# Patient Record
Sex: Male | Born: 1945 | Race: White | Hispanic: No | Marital: Married | Smoking: Never smoker
Health system: Southern US, Community
[De-identification: ages and names within clinical notes are randomized; demographics above are authoritative.]

## PROBLEM LIST (undated history)

## (undated) DIAGNOSIS — G4700 Insomnia, unspecified: Secondary | ICD-10-CM

## (undated) DIAGNOSIS — L719 Rosacea, unspecified: Secondary | ICD-10-CM

## (undated) DIAGNOSIS — Z85030 Personal history of malignant carcinoid tumor of large intestine: Secondary | ICD-10-CM

## (undated) DIAGNOSIS — Z85060 Personal history of malignant carcinoid tumor of small intestine: Secondary | ICD-10-CM

## (undated) DIAGNOSIS — M1710 Unilateral primary osteoarthritis, unspecified knee: Secondary | ICD-10-CM

## (undated) DIAGNOSIS — J383 Other diseases of vocal cords: Secondary | ICD-10-CM

## (undated) DIAGNOSIS — M179 Osteoarthritis of knee, unspecified: Secondary | ICD-10-CM

## (undated) DIAGNOSIS — J309 Allergic rhinitis, unspecified: Secondary | ICD-10-CM

## (undated) DIAGNOSIS — N529 Male erectile dysfunction, unspecified: Secondary | ICD-10-CM

## (undated) HISTORY — PX: APPENDECTOMY: SHX54

## (undated) HISTORY — DX: Unilateral primary osteoarthritis, unspecified knee: M17.10

## (undated) HISTORY — DX: Osteoarthritis of knee, unspecified: M17.9

## (undated) HISTORY — DX: Other diseases of vocal cords: J38.3

## (undated) HISTORY — DX: Allergic rhinitis, unspecified: J30.9

## (undated) HISTORY — DX: Insomnia, unspecified: G47.00

## (undated) HISTORY — PX: COLONOSCOPY: SHX174

## (undated) HISTORY — PX: KNEE SURGERY: SHX244

## (undated) HISTORY — PX: PARTIAL COLECTOMY: SHX5273

## (undated) HISTORY — DX: Rosacea, unspecified: L71.9

## (undated) HISTORY — DX: Male erectile dysfunction, unspecified: N52.9

## (undated) HISTORY — DX: Personal history of malignant carcinoid tumor of large intestine: Z85.030

## (undated) HISTORY — PX: INGUINAL HERNIA REPAIR: SUR1180

## (undated) HISTORY — DX: Personal history of malignant carcinoid tumor of small intestine: Z85.060

---

## 2002-11-10 ENCOUNTER — Encounter: Payer: Self-pay | Admitting: Orthopedic Surgery

## 2002-11-10 ENCOUNTER — Encounter: Payer: Self-pay | Admitting: Emergency Medicine

## 2002-11-10 ENCOUNTER — Observation Stay (HOSPITAL_COMMUNITY): Admission: EM | Admit: 2002-11-10 | Discharge: 2002-11-11 | Payer: Self-pay | Admitting: Emergency Medicine

## 2002-11-11 ENCOUNTER — Encounter: Payer: Self-pay | Admitting: Orthopedic Surgery

## 2003-04-21 ENCOUNTER — Ambulatory Visit (HOSPITAL_COMMUNITY): Admission: RE | Admit: 2003-04-21 | Discharge: 2003-04-21 | Payer: Self-pay | Admitting: Gastroenterology

## 2003-05-13 ENCOUNTER — Encounter: Payer: Self-pay | Admitting: Gastroenterology

## 2003-05-13 ENCOUNTER — Encounter: Admission: RE | Admit: 2003-05-13 | Discharge: 2003-05-13 | Payer: Self-pay | Admitting: Gastroenterology

## 2003-06-13 ENCOUNTER — Encounter: Payer: Self-pay | Admitting: General Surgery

## 2003-06-18 ENCOUNTER — Inpatient Hospital Stay (HOSPITAL_COMMUNITY): Admission: RE | Admit: 2003-06-18 | Discharge: 2003-06-23 | Payer: Self-pay | Admitting: General Surgery

## 2013-06-18 ENCOUNTER — Other Ambulatory Visit (HOSPITAL_COMMUNITY): Payer: Self-pay | Admitting: Family Medicine

## 2013-06-18 DIAGNOSIS — J441 Chronic obstructive pulmonary disease with (acute) exacerbation: Secondary | ICD-10-CM

## 2013-06-26 ENCOUNTER — Encounter (HOSPITAL_COMMUNITY): Payer: Self-pay

## 2014-02-20 ENCOUNTER — Ambulatory Visit
Admission: RE | Admit: 2014-02-20 | Discharge: 2014-02-20 | Disposition: A | Discharge status: Home / Self Care | Payer: Commercial Managed Care - HMO | Source: Ambulatory Visit | Attending: Family Medicine | Admitting: Family Medicine

## 2014-02-20 ENCOUNTER — Other Ambulatory Visit: Payer: Self-pay | Admitting: Family Medicine

## 2014-02-20 DIAGNOSIS — M79645 Pain in left finger(s): Secondary | ICD-10-CM

## 2016-06-08 DIAGNOSIS — N529 Male erectile dysfunction, unspecified: Secondary | ICD-10-CM | POA: Diagnosis not present

## 2016-06-08 DIAGNOSIS — K219 Gastro-esophageal reflux disease without esophagitis: Secondary | ICD-10-CM | POA: Diagnosis not present

## 2016-06-08 DIAGNOSIS — Z85060 Personal history of malignant carcinoid tumor of small intestine: Secondary | ICD-10-CM | POA: Diagnosis not present

## 2016-06-08 DIAGNOSIS — Z23 Encounter for immunization: Secondary | ICD-10-CM | POA: Diagnosis not present

## 2016-06-08 DIAGNOSIS — G4700 Insomnia, unspecified: Secondary | ICD-10-CM | POA: Diagnosis not present

## 2016-06-08 DIAGNOSIS — Z0000 Encounter for general adult medical examination without abnormal findings: Secondary | ICD-10-CM | POA: Diagnosis not present

## 2016-06-08 DIAGNOSIS — Z79899 Other long term (current) drug therapy: Secondary | ICD-10-CM | POA: Diagnosis not present

## 2016-06-08 DIAGNOSIS — B001 Herpesviral vesicular dermatitis: Secondary | ICD-10-CM | POA: Diagnosis not present

## 2016-06-08 DIAGNOSIS — L719 Rosacea, unspecified: Secondary | ICD-10-CM | POA: Diagnosis not present

## 2016-06-08 DIAGNOSIS — Z125 Encounter for screening for malignant neoplasm of prostate: Secondary | ICD-10-CM | POA: Diagnosis not present

## 2016-08-22 DIAGNOSIS — H2513 Age-related nuclear cataract, bilateral: Secondary | ICD-10-CM | POA: Diagnosis not present

## 2016-08-22 DIAGNOSIS — H04123 Dry eye syndrome of bilateral lacrimal glands: Secondary | ICD-10-CM | POA: Diagnosis not present

## 2016-08-22 DIAGNOSIS — H02831 Dermatochalasis of right upper eyelid: Secondary | ICD-10-CM | POA: Diagnosis not present

## 2016-10-31 DIAGNOSIS — C44311 Basal cell carcinoma of skin of nose: Secondary | ICD-10-CM | POA: Diagnosis not present

## 2016-10-31 DIAGNOSIS — C44319 Basal cell carcinoma of skin of other parts of face: Secondary | ICD-10-CM | POA: Diagnosis not present

## 2016-11-21 DIAGNOSIS — C44311 Basal cell carcinoma of skin of nose: Secondary | ICD-10-CM | POA: Diagnosis not present

## 2016-11-21 DIAGNOSIS — Z85828 Personal history of other malignant neoplasm of skin: Secondary | ICD-10-CM | POA: Diagnosis not present

## 2017-03-21 DIAGNOSIS — L309 Dermatitis, unspecified: Secondary | ICD-10-CM | POA: Diagnosis not present

## 2017-06-16 DIAGNOSIS — D2261 Melanocytic nevi of right upper limb, including shoulder: Secondary | ICD-10-CM | POA: Diagnosis not present

## 2017-06-16 DIAGNOSIS — Z85828 Personal history of other malignant neoplasm of skin: Secondary | ICD-10-CM | POA: Diagnosis not present

## 2017-06-16 DIAGNOSIS — D2239 Melanocytic nevi of other parts of face: Secondary | ICD-10-CM | POA: Diagnosis not present

## 2017-06-16 DIAGNOSIS — D2272 Melanocytic nevi of left lower limb, including hip: Secondary | ICD-10-CM | POA: Diagnosis not present

## 2017-06-16 DIAGNOSIS — L821 Other seborrheic keratosis: Secondary | ICD-10-CM | POA: Diagnosis not present

## 2017-06-16 DIAGNOSIS — D225 Melanocytic nevi of trunk: Secondary | ICD-10-CM | POA: Diagnosis not present

## 2017-06-16 DIAGNOSIS — L570 Actinic keratosis: Secondary | ICD-10-CM | POA: Diagnosis not present

## 2017-06-26 DIAGNOSIS — K219 Gastro-esophageal reflux disease without esophagitis: Secondary | ICD-10-CM | POA: Diagnosis not present

## 2017-06-26 DIAGNOSIS — Z23 Encounter for immunization: Secondary | ICD-10-CM | POA: Diagnosis not present

## 2017-06-26 DIAGNOSIS — Z1159 Encounter for screening for other viral diseases: Secondary | ICD-10-CM | POA: Diagnosis not present

## 2017-06-26 DIAGNOSIS — Z0000 Encounter for general adult medical examination without abnormal findings: Secondary | ICD-10-CM | POA: Diagnosis not present

## 2017-06-26 DIAGNOSIS — L719 Rosacea, unspecified: Secondary | ICD-10-CM | POA: Diagnosis not present

## 2017-06-26 DIAGNOSIS — Z13220 Encounter for screening for lipoid disorders: Secondary | ICD-10-CM | POA: Diagnosis not present

## 2017-06-26 DIAGNOSIS — Z79899 Other long term (current) drug therapy: Secondary | ICD-10-CM | POA: Diagnosis not present

## 2017-06-26 DIAGNOSIS — N529 Male erectile dysfunction, unspecified: Secondary | ICD-10-CM | POA: Diagnosis not present

## 2017-06-26 DIAGNOSIS — G4700 Insomnia, unspecified: Secondary | ICD-10-CM | POA: Diagnosis not present

## 2017-06-26 DIAGNOSIS — Z125 Encounter for screening for malignant neoplasm of prostate: Secondary | ICD-10-CM | POA: Diagnosis not present

## 2017-07-07 DIAGNOSIS — R7301 Impaired fasting glucose: Secondary | ICD-10-CM | POA: Diagnosis not present

## 2017-09-04 DIAGNOSIS — H2513 Age-related nuclear cataract, bilateral: Secondary | ICD-10-CM | POA: Diagnosis not present

## 2017-09-04 DIAGNOSIS — H04123 Dry eye syndrome of bilateral lacrimal glands: Secondary | ICD-10-CM | POA: Diagnosis not present

## 2017-09-04 DIAGNOSIS — H43813 Vitreous degeneration, bilateral: Secondary | ICD-10-CM | POA: Diagnosis not present

## 2017-09-04 DIAGNOSIS — H02831 Dermatochalasis of right upper eyelid: Secondary | ICD-10-CM | POA: Diagnosis not present

## 2018-02-08 ENCOUNTER — Encounter (INDEPENDENT_AMBULATORY_CARE_PROVIDER_SITE_OTHER): Payer: Self-pay | Admitting: Orthopedic Surgery

## 2018-02-08 ENCOUNTER — Ambulatory Visit (INDEPENDENT_AMBULATORY_CARE_PROVIDER_SITE_OTHER): Payer: PPO | Admitting: Orthopedic Surgery

## 2018-02-08 DIAGNOSIS — M1711 Unilateral primary osteoarthritis, right knee: Secondary | ICD-10-CM | POA: Diagnosis not present

## 2018-02-10 ENCOUNTER — Encounter (INDEPENDENT_AMBULATORY_CARE_PROVIDER_SITE_OTHER): Payer: Self-pay | Admitting: Orthopedic Surgery

## 2018-02-10 NOTE — Progress Notes (Signed)
   Office Visit Note   Patient: Barry Pope           Date of Birth: [birth year]           MRN: [id number] Visit Date: 02/08/2018 Requested by: No referring provider defined for this encounter. PCP: Barry Neer, MD  Subjective: No chief complaint on file.   HPI: Barry Pope is a patient with right knee pain.  Had arthroscopy 16 years ago.  He states that he is reporting tightness in the right knee for a while.  Worse 2 weeks ago after vacation in [place] but is better now.  Reports some weakness and giving way.  States he cannot really fully flex the knee to stretch out his quad.  Denies any mechanical symptoms and he does not take any medication for pain.  Stairs do hurt the right knee.  He likes to do [place] working out such as treadmill walking.  He also does Engineer, drilling work.              ROS: All systems reviewed are negative as they relate to the chief complaint within the history of present illness.  Patient denies  fevers or chills.   Assessment & Plan: Visit Diagnoses:  1. Unilateral primary osteoarthritis, right knee     Plan: Pression is right knee pain with history of partial meniscectomy likely aggravation of some existing arthritis.  I will try him with Duexis samples.  Aspiration injection next step along with radiographs.  He did really well on radiographs today.  We will see him back as needed.  I did caution him as to the importance of avoiding significant loadbearing activity.  Follow-Up Instructions: Return if symptoms worsen or fail to improve.   Orders:  No orders of the defined types were placed in this encounter.  No orders of the defined types were placed in this encounter.     Procedures: No procedures performed   Clinical Data: No additional findings.  Objective: Vital Signs: There were no vitals taken for this visit.  Physical Exam:   Constitutional: Patient appears well-developed HEENT:  Head: Normocephalic Eyes:EOM are  normal Neck: Normal range of motion Cardiovascular: Normal rate Pulmonary/chest: Effort normal Neurologic: Patient is alert Skin: Skin is warm Psychiatric: Patient has normal mood and affect    Ortho Exam: Orthopedic exam demonstrates mild medial and lateral joint line tenderness.  Extensor mechanism is intact.  Range of motion is full with full extension and flexion.  Mild effusion is present on the right knee no effusion of the left knee.  Collateral and cruciate ligaments are stable.  No other masses lymphadenopathy or skin changes noted in the right knee region  Specialty Comments:  No specialty comments available.  Imaging: No results found.   PMFS History: There are no active problems to display for this patient.  History reviewed. No pertinent past medical history.  History reviewed. No pertinent family history.  History reviewed. No pertinent surgical history. Social History   Occupational History  . Not on file  Tobacco Use  . Smoking status: Never Smoker  Substance and Sexual Activity  . Alcohol use: Not on file  . Drug use: Not on file  . Sexual activity: Not on file

## 2018-08-03 DIAGNOSIS — D2262 Melanocytic nevi of left upper limb, including shoulder: Secondary | ICD-10-CM | POA: Diagnosis not present

## 2018-08-03 DIAGNOSIS — Z85828 Personal history of other malignant neoplasm of skin: Secondary | ICD-10-CM | POA: Diagnosis not present

## 2018-08-03 DIAGNOSIS — D485 Neoplasm of uncertain behavior of skin: Secondary | ICD-10-CM | POA: Diagnosis not present

## 2018-08-03 DIAGNOSIS — D2272 Melanocytic nevi of left lower limb, including hip: Secondary | ICD-10-CM | POA: Diagnosis not present

## 2018-08-03 DIAGNOSIS — D2239 Melanocytic nevi of other parts of face: Secondary | ICD-10-CM | POA: Diagnosis not present

## 2018-08-03 DIAGNOSIS — L821 Other seborrheic keratosis: Secondary | ICD-10-CM | POA: Diagnosis not present

## 2018-08-03 DIAGNOSIS — D225 Melanocytic nevi of trunk: Secondary | ICD-10-CM | POA: Diagnosis not present

## 2018-09-04 DIAGNOSIS — K219 Gastro-esophageal reflux disease without esophagitis: Secondary | ICD-10-CM | POA: Diagnosis not present

## 2018-09-04 DIAGNOSIS — Z125 Encounter for screening for malignant neoplasm of prostate: Secondary | ICD-10-CM | POA: Diagnosis not present

## 2018-09-04 DIAGNOSIS — N529 Male erectile dysfunction, unspecified: Secondary | ICD-10-CM | POA: Diagnosis not present

## 2018-09-04 DIAGNOSIS — L719 Rosacea, unspecified: Secondary | ICD-10-CM | POA: Diagnosis not present

## 2018-09-04 DIAGNOSIS — Z0000 Encounter for general adult medical examination without abnormal findings: Secondary | ICD-10-CM | POA: Diagnosis not present

## 2018-09-04 DIAGNOSIS — M179 Osteoarthritis of knee, unspecified: Secondary | ICD-10-CM | POA: Diagnosis not present

## 2018-09-04 DIAGNOSIS — Z79899 Other long term (current) drug therapy: Secondary | ICD-10-CM | POA: Diagnosis not present

## 2018-09-04 DIAGNOSIS — G4700 Insomnia, unspecified: Secondary | ICD-10-CM | POA: Diagnosis not present

## 2018-09-17 DIAGNOSIS — H35372 Puckering of macula, left eye: Secondary | ICD-10-CM | POA: Diagnosis not present

## 2018-09-17 DIAGNOSIS — H2513 Age-related nuclear cataract, bilateral: Secondary | ICD-10-CM | POA: Diagnosis not present

## 2018-09-17 DIAGNOSIS — H43813 Vitreous degeneration, bilateral: Secondary | ICD-10-CM | POA: Diagnosis not present

## 2018-11-13 DIAGNOSIS — H04123 Dry eye syndrome of bilateral lacrimal glands: Secondary | ICD-10-CM | POA: Diagnosis not present

## 2019-01-08 DIAGNOSIS — L570 Actinic keratosis: Secondary | ICD-10-CM | POA: Diagnosis not present

## 2019-01-08 DIAGNOSIS — Z85828 Personal history of other malignant neoplasm of skin: Secondary | ICD-10-CM | POA: Diagnosis not present

## 2019-03-14 DIAGNOSIS — R490 Dysphonia: Secondary | ICD-10-CM | POA: Diagnosis not present

## 2019-03-14 DIAGNOSIS — Z7189 Other specified counseling: Secondary | ICD-10-CM | POA: Diagnosis not present

## 2019-03-26 DIAGNOSIS — J383 Other diseases of vocal cords: Secondary | ICD-10-CM | POA: Diagnosis not present

## 2019-03-26 DIAGNOSIS — J300 Vasomotor rhinitis: Secondary | ICD-10-CM

## 2019-03-26 DIAGNOSIS — Z7289 Other problems related to lifestyle: Secondary | ICD-10-CM | POA: Diagnosis not present

## 2019-03-26 DIAGNOSIS — K219 Gastro-esophageal reflux disease without esophagitis: Secondary | ICD-10-CM | POA: Insufficient documentation

## 2019-03-26 DIAGNOSIS — Z974 Presence of external hearing-aid: Secondary | ICD-10-CM | POA: Diagnosis not present

## 2019-03-26 HISTORY — DX: Vasomotor rhinitis: J30.0

## 2019-03-26 HISTORY — DX: Other diseases of vocal cords: J38.3

## 2019-03-26 HISTORY — DX: Gastro-esophageal reflux disease without esophagitis: K21.9

## 2019-04-10 ENCOUNTER — Encounter: Payer: Self-pay | Admitting: Speech Pathology

## 2019-04-10 ENCOUNTER — Other Ambulatory Visit: Payer: Self-pay

## 2019-04-10 ENCOUNTER — Ambulatory Visit: Payer: PPO | Attending: Otolaryngology | Admitting: Speech Pathology

## 2019-04-10 DIAGNOSIS — R498 Other voice and resonance disorders: Secondary | ICD-10-CM | POA: Insufficient documentation

## 2019-04-10 NOTE — Therapy (Signed)
[facility] [address] [place], [place], [address] Phone: [phone]   Fax:  [phone]  Speech Language Pathology Evaluation  Patient Details  Name: Barry Pope MRN: [id number] Date of Birth: Oct 22, 1945 Referring Provider (SLP): Dr. Gavin Pound   Encounter Date: 04/10/2019  End of Session - 04/10/19 1201    Visit Number  1    Number of Visits  17    Date for SLP Re-Evaluation  06/05/19    SLP Start Time  1104    SLP Stop Time   3007    SLP Time Calculation (min)  44 min    Activity Tolerance  Patient tolerated treatment well       History reviewed. No pertinent past medical history.  History reviewed. No pertinent surgical history.  There were no vitals filed for this visit.      SLP Evaluation [facility] - 04/10/19 1110      SLP Visit Information   SLP Received On  04/10/19    Referring Provider (SLP)  Dr. 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 male referred by ENT for dysphonia. Pt notices a raspy, breathy voice with intermittent aphonia. He reports difficulty projecting. PMH + carcinoid tumor of small intestine, GERD, rash.  ENT consult 03/26/19 revealed "an ellipse shaped glottic gap persistent in all vocal phases. Slightly thin and bowed vocal cords bilaterally the motion remains intact. Mild postcricoid redundancy consistent with likely acid reflux. 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 Pt does report minor changes in swallowiing his pills, only occasionally    Mobility Status  walks independently      Balance Screen   Has the patient fallen in the past 6 months  No    Has the patient had a decrease in activity level because of a fear of falling?   No     Is the patient reluctant to leave their home because of a fear of falling?   No      Prior Functional Status   Cognitive/Linguistic Baseline  Within functional limits    Type of Home  Apartment     Lives With  Spouse    Education  [place]    Vocation  Retired      Associate Professor   Overall Cognitive Status  Within Functional Limits for tasks assessed      Verbal Expression   Overall Verbal Expression  Appears within functional limits for tasks assessed      Oral Motor/Sensory Function   Overall Oral Motor/Sensory Function  Appears within functional limits for tasks assessed      Motor Speech   Overall Motor Speech  Impaired    Respiration  Impaired    Level of Impairment  Conversation    Phonation  Breathy;Hoarse;Low vocal intensity;Wet    Resonance  Within functional limits    Articulation  Within functional limitis    Intelligibility  Intelligible    Word  75-100% accurate    Phrase  75-100% accurate    Sentence  75-100% accurate    Conversation  75-100% accurate    Motor Planning  Witnin functional limits    Interfering Components  Hearing  loss    Effective Techniques  Increased vocal intensity    Phonation  Impaired    Vocal Abuses  Habitual Cough/Throat Clear    Tension Present  --   none   Volume  Soft    Pitch  Appropriate                      SLP Education - 04/10/19 1200    Education Details  HEP for dysphonia; compensations for dysphonia    Person(s) Educated  Patient    Methods  Explanation;Demonstration;Verbal cues;Handout    Comprehension  Verbalized understanding;Returned demonstration;Verbal cues required;Need further instruction       SLP Short Term Goals - 04/10/19 1219      SLP SHORT TERM GOAL #1   Title  Pt will perfrom vocal function exercises with rare min A over 3 sessions    Time  4    Period  Weeks    Status  New      SLP SHORT TERM GOAL #2   Title  Pt will report/demonstrate carryover in use of throat clear alternatives  with min A over 3 sessions.    Time  4    Period  Weeks    Status  New      SLP SHORT TERM GOAL #3   Title  Pt will utilize abdominal breathing and vocal intensity in structured tasks a sentence level with rare min A over 2 sessions    Time  4    Period  Weeks    Status  New       SLP Long Term Goals - 04/10/19 1231      SLP LONG TERM GOAL #1   Title  Pt will complete vocal function exercises with mod I over 3 sessions    Time  8    Period  Weeks    Status  New      SLP LONG TERM GOAL #2   Title  Pt wil demonstrate abdominal breathing and vocal intensity over 10 minute moderately complex conversation.    Time  8    Period  Weeks    Status  New      SLP LONG TERM GOAL #3   Title  Pt will demonstrate WFL vocal qualtiy and endurance 10 minutes of conversation in noisy environment outside of treatment room with mod I over 2 sessions    Time  8    Period  Weeks    Status  New       Plan - 04/10/19 1202    Clinical Impression Statement  Voice case history was taken. Pt reports mild voice use. He states his wife noticed this over the past year, and he noticed difficulty speaking since January. He drinks approx 20 oz of water a day, 2 cups of coffee in the morning. Pt reports intermittent aphonia. ENT did note mild reflux, however [name] takes his reflux meds "whenever I have a flair up."  ENT did discuss reflux dietary and lifestyle modifications. We will continue to review this with him. He reports he feels "phlegm" in his throat and will clear his throat usually to clear this.Pt did report that when he gives his voice more volume, the raspiness improves. Strain or tension not appreciated in his neck or jaw. 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 Voice Handicap index is 38 - moderate impairment. Maximum phonation time is 24 seconds WNL) and  his /s/ to /z/ ratio is 1.1 (WNL).  Voice quality is raspy and hoarse with low  volume.  Loud /a/ did clear phonation. I trained pt in throat clear alternatives and inititated HEP for voice. Pt educated on proper breath support for volume and phonation. I recommend skilled ST for improved vocal quality, function and endurance to maximize intelligibilty.    Speech Therapy Frequency  2x / week    Duration  --   8 weeks or 17 visits   Treatment/Interventions  Aspiration precaution training;Cueing hierarchy;Functional tasks;Patient/family education;Compensatory strategies;Environmental controls;Compensatory techniques;Internal/external aids;SLP instruction and feedback;Diet toleration management by SLP       Patient will benefit from skilled therapeutic intervention in order to improve the following deficits and impairments:   1. Other voice and resonance disorders       Problem List There are no active problems to display for this patient.   Lenin Kuhnle, Annye Rusk 04/10/2019, 12:41 PM MS, [facility] [address] [place], [place], [address] Phone: [phone]   Fax:  [phone]  Name: Barry Pope MRN: [id number] Date of Birth: 07/03/1946

## 2019-04-10 NOTE — Patient Instructions (Signed)
  Try to reduce your throat clears - do a hard swallow, take a sip of water with a hard swallow or do an effortful blow followed by a hard swallow.  The secretions pooling in your throat may be from not swallowing enough - try to be mindful about swallowing more frequently - use a loose rubber band on your wrist   Semi-occluded vocal tract exercises (SOVTE)  These allow your vocal folds to vibrate without excess tension and promotes high placement of the voice  Use SOVTE as a warm up before prolonged speaking and vocal exercises   High resistance: voicing through a stirring straw  Medium resistance: voicing through a drinking straw  Less resistance: Voiced /v/                            Lip or Tongue Trill                            Nasal "hums" /m/ and /n/                            Vowels /u/ and ee  Watch Vocal Straw Exercises with Lolita Cram on YouTube:  [email or web address].be  Pitch Glides for 2 minutes  Accents (siren)  Hum the [employer]  A goal would be 2-3 minutes several times a day and prior to vocal exercises  As always, use good belly breathing while completing SOVTE   Practice 10 abdominal breaths  Straw phonation  2 minutes prior to PHoRTE and throughout the day and before any prolonged speaking  PHoRTE - twice a day  [email or web address].[email or web address]  1.  10 Loud AH's as loud as you can and as long as you can       To help coach you at home with your loud AH!!       YouTube: Speech Therapy Practice - Sustained Phonation /ah/ 10 times - Phonatory                  Resistance Strengthening Exercises Speech Therapy       [email or web address].smith.com/   2. 10 Pitch glides up, 10 pitch glides down   3. 10 sentences in loud high pitch voice, like you are calling your neighbor over the phone  4. 10 sentences in loud low authoritative pitch, like you are the boss  Use a good belly breath before each  exercise- feel your abs contract in as you use your voice  Laryngeal massage Elissa Weinzimmer on Youtube  LandscapingLessons.[email or web address]  Use a good abdominal breath to power your voice when talking  Keep reducing throat clears

## 2019-04-16 ENCOUNTER — Ambulatory Visit: Payer: PPO | Admitting: Speech Pathology

## 2019-04-16 ENCOUNTER — Other Ambulatory Visit: Payer: Self-pay

## 2019-04-16 DIAGNOSIS — R498 Other voice and resonance disorders: Secondary | ICD-10-CM

## 2019-04-16 NOTE — Therapy (Signed)
[facility] [address] [place], [place], [address] Phone: [phone]   Fax:  [phone]  Speech Language Pathology Treatment  Patient Details  Name: Barry Pope MRN: [id number] Date of Birth: [birth year] Referring Provider (SLP): Dr. Gavin Pound   Encounter Date: 04/16/2019  End of Session - 04/16/19 1451    Visit Number  2    Number of Visits  17    Date for SLP Re-Evaluation  06/05/19    SLP Start Time  1400    SLP Stop Time   1443    SLP Time Calculation (min)  43 min    Activity Tolerance  Patient tolerated treatment well       No past medical history on file.  No past surgical history on file.  There were no vitals filed for this visit.  Subjective Assessment - 04/16/19 1403    Subjective  "Been doing them twice a day."    Currently in Pain?  No/denies            ADULT SLP TREATMENT - 04/16/19 1447      General Information   Behavior/Cognition  Alert;Cooperative;Pleasant mood      Treatment Provided   Treatment provided  Cognitive-Linquistic      Pain Assessment   Pain Assessment  No/denies pain      Cognitive-Linquistic Treatment   Treatment focused on  Voice    Skilled Treatment  Pt demo'd HEP for voice with occasional min A for abdominal breathing in pitch glides, sentence level tasks, and for effort with loud /a/. Pt benefitted from visual feedback in mirror for AB, and self-corrected on several occasions. In sentence level responses with cognitive load, pt required occasional min A for AB; tactile self-cue of hand on abdomen improved awareness. Carryover into simple conversation ~60% with occasional min-mod A.       Assessment / Recommendations / Plan   Plan  Continue with current plan of care      Progression Toward Goals   Progression toward goals  Progressing toward goals       SLP Education - 04/16/19 1451    Education Details  abdominal breathing with HEP    Person(s) Educated  Patient    Methods  Explanation;Demonstration;Verbal cues    Comprehension  Verbalized understanding       SLP Short Term Goals - 04/16/19 1458      SLP SHORT TERM GOAL #1   Title  Pt will perfrom vocal function exercises with rare min A over 3 sessions    Time  4    Period  Weeks    Status  On-going      SLP SHORT TERM GOAL #2   Title  Pt will report/demonstrate carryover in use of throat clear alternatives with min A over 3 sessions.    Time  4    Period  Weeks    Status  On-going      SLP SHORT TERM GOAL #3   Title  Pt will utilize abdominal breathing and vocal intensity in structured tasks a sentence level with rare min A over 2 sessions    Time  4    Period  Weeks    Status  On-going       SLP Long Term Goals - 04/16/19 1458      SLP LONG TERM GOAL #1   Title  Pt will complete vocal function exercises with mod I over 3 sessions  Time  8    Period  Weeks    Status  On-going      SLP LONG TERM GOAL #2   Title  Pt wil demonstrate abdominal breathing and vocal intensity over 10 minute moderately complex conversation.    Time  8    Period  Weeks    Status  On-going      SLP LONG TERM GOAL #3   Title  Pt will demonstrate WFL vocal qualtiy and endurance 10 minutes of conversation in noisy environment outside of treatment room with mod I over 2 sessions    Time  8    Period  Weeks    Status  On-going       Plan - 04/16/19 1452    Clinical Impression Statement  Pt presents with dysphonia secondary to age-related vocal fold atrophy. Continued training in HEP for voice and abdominal breathing. Pt with good success at rest and short sentences, cues required for carryover in spontaneous speech. Pt educated on proper breath support for volume and phonation. 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 I recommend skilled ST for improved vocal quality, function and endurance to maximize  intelligibilty.    Speech Therapy Frequency  2x / week    Duration  --   8 weeks or 17 visits   Treatment/Interventions  Aspiration precaution training;Cueing hierarchy;Functional tasks;Patient/family education;Compensatory strategies;Environmental controls;Compensatory techniques;Internal/external aids;SLP instruction and feedback;Diet toleration management by SLP       Patient will benefit from skilled therapeutic intervention in order to improve the following deficits and impairments:   1. Other voice and resonance disorders       Problem List There are no active problems to display for this patient.  Deneise Lever, [place], CCC-SLP Speech-Language Pathologist   Aliene Altes 04/16/2019, 2:58 PM  [facility] [address] [place], [place], [address] Phone: [phone]   Fax:  [phone]   Name: Barry Pope MRN: [id number] Date of Birth: June 01, 1946

## 2019-04-17 ENCOUNTER — Ambulatory Visit: Payer: PPO | Admitting: Speech Pathology

## 2019-04-17 DIAGNOSIS — R498 Other voice and resonance disorders: Secondary | ICD-10-CM | POA: Diagnosis not present

## 2019-04-17 NOTE — Therapy (Signed)
[facility] [address] [place], [place], [address] Phone: [phone]   Fax:  [phone]  Speech Language Pathology Treatment  Patient Details  Name: Barry Pope MRN: [id number] Date of Birth: Dec 02, 1945 Referring Provider (SLP): Dr. Gavin Pound   Encounter Date: 04/17/2019  End of Session - 04/17/19 0950    Visit Number  3    Number of Visits  17    Date for SLP Re-Evaluation  06/05/19    SLP Start Time  0900    SLP Stop Time   0943    SLP Time Calculation (min)  43 min    Activity Tolerance  Patient tolerated treatment well       No past medical history on file.  No past surgical history on file.  There were no vitals filed for this visit.  Subjective Assessment - 04/17/19 0903    Subjective  "One session last night. I worked on my stomach breathing."    Currently in Pain?  No/denies            ADULT SLP TREATMENT - 04/17/19 0946      General Information   Behavior/Cognition  Alert;Cooperative;Pleasant mood      Treatment Provided   Treatment provided  Cognitive-Linquistic      Pain Assessment   Pain Assessment  No/denies pain      Cognitive-Linquistic Treatment   Treatment focused on  Voice    Skilled Treatment  Pt demo'd HEP for voice with rare min A for abdominal breathing; used mirror for self-monitoring. Pt generated sentences re: topic of interest (foreign policy) with abdominal breathing 19/20 sentences. Intermittent carryover to spontaneous responses, improved with visual cues from SLP. Simple conversation (5 minutes) with occasional visual cues for AB. Cues required mainly with longer responses and cognitive load. SLP provided education and handout re: behavioral modifications for reflux. Pt reports eating meals late (8:15, before bed 9ish).      Assessment / Recommendations / Plan   Plan  Continue with current plan of care      Progression Toward Goals   Progression  toward goals  Progressing toward goals       SLP Education - 04/17/19 0950    Education Details  behavioral modifications for reflux    Person(s) Educated  Patient    Methods  Explanation;Handout    Comprehension  Verbalized understanding       SLP Short Term Goals - 04/17/19 0950      SLP SHORT TERM GOAL #1   Title  Pt will perfrom vocal function exercises with rare min A over 3 sessions    Time  4    Period  Weeks    Status  On-going      SLP SHORT TERM GOAL #2   Title  Pt will report/demonstrate carryover in use of throat clear alternatives with min A over 3 sessions.    Time  4    Period  Weeks    Status  On-going      SLP SHORT TERM GOAL #3   Title  Pt will utilize abdominal breathing and vocal intensity in structured tasks a sentence level with rare min A over 2 sessions    Time  4    Period  Weeks    Status  On-going       SLP Long Term Goals - 04/17/19 0951      SLP LONG TERM GOAL #1   Title  Pt will  complete vocal function exercises with mod I over 3 sessions    Time  8    Period  Weeks    Status  On-going      SLP LONG TERM GOAL #2   Title  Pt wil demonstrate abdominal breathing and vocal intensity over 10 minute moderately complex conversation.    Time  8    Period  Weeks    Status  On-going      SLP LONG TERM GOAL #3   Title  Pt will demonstrate WFL vocal qualtiy and endurance 10 minutes of conversation in noisy environment outside of treatment room with mod I over 2 sessions    Time  8    Period  Weeks    Status  On-going       Plan - 04/17/19 7341    Clinical Impression Statement  Pt presents with dysphonia secondary to age-related vocal fold atrophy. Continued training in HEP for voice and abdominal breathing. Pt with good success at rest and short sentences, cues required for carryover in spontaneous speech. Pt educated on proper breath support for volume and phonation. Pt leads a discussion group and reports difficulty projecting and being  heard in noisy evironments. I recommend skilled ST for improved vocal quality, function and endurance to maximize intelligibilty.    Speech Therapy Frequency  2x / week    Duration  --   8 weeks or 17 visits   Treatment/Interventions  Aspiration precaution training;Cueing hierarchy;Functional tasks;Patient/family education;Compensatory strategies;Environmental controls;Compensatory techniques;Internal/external aids;SLP instruction and feedback;Diet toleration management by SLP       Patient will benefit from skilled therapeutic intervention in order to improve the following deficits and impairments:   1. Other voice and resonance disorders       Problem List There are no active problems to display for this patient.  Deneise Lever, [place], CCC-SLP Speech-Language Pathologist  Aliene Altes 04/17/2019, 9:51 AM  [facility] [address] [place], [place], [address] Phone: [phone]   Fax:  [phone]   Name: Barry Pope MRN: [id number] Date of Birth: Jul 04, 1946

## 2019-04-17 NOTE — Patient Instructions (Signed)
Your signs and symptoms may be consistent with esophageal dysphagia. Only your doctor can diagnose esophageal dysphagia, please discuss with your physician.  What is reflux? Gastroesophageal Reflux Disease (GERD) commonly referred to as reflux, is a backflow of acid from the stomach into the swallowing tube or esophagus.  Some reflux is normal, but when it happens frequently, the acid can irritate and damage the lining on the inside of the esophagus.  The most common symptom is heartburn.  Laryngopharyngeal Reflux (LPR) is when the acid backflow reaches the throat.  The structures of the throat (pharynx, larynx) are much more sensitive to stomach acid, so there is increased risk of damage.  People with LPR often do not experience heartburn.  The more common symptoms of LPR include: hoarseness, chronic cough, frequent throat clearing, feeling a lump in the throat and problems swallowing.  There are many changes you can make in diet, positioning and in your lifestyle that can have a dramatic effect in preventing or stopping reflux.  They include:  Everyday: Avoid tight or constricting clothing Avoid smoking, or exposing yourself to second hand smoke Avoid non-steroidal anti-inflammatory drugs (ibuprofen, Alleve) Exercise regularly, reduce stress Lose weight   Avoiding or limiting certain foods: Spicy, acidic (tomato-based foods, citrus or vinegar-based foods) Fruit juices such as orange, grapefruit or cranberry Fried foods Caffeine (such as coffee, tea, sodas)   Carbonated beverages  Chocolate  Peppermint Alcohol Decrease Dairy Decrease red meat Any food that gives you symptoms      During and after meals: Eat slowly and don't overeat at meals Eat several smaller meals a day, rather than larger ones Do not lie down for at least  hour- 1 hour after meals Avoid bending over or exercising after eating Chewing gum (non-mint) for 20 minutes after each meal may be helpful Drinking warm  fluids with meals (i.e. warm decaffeinated tea) may help clear the esophagus better  Bedtime: Avoid eating or drinking within 2-3 hours before bedtime, except for water Elevate the head of the bed 6-8 inches with blocks, books, or wedge under your mattress (propping  yourself up on pillows may cause neck or back pain) If you take medications at night, be sure to take them with a full glass of water  

## 2019-04-24 ENCOUNTER — Other Ambulatory Visit: Payer: Self-pay

## 2019-04-24 ENCOUNTER — Ambulatory Visit: Payer: PPO | Admitting: Speech Pathology

## 2019-04-24 ENCOUNTER — Encounter: Payer: Self-pay | Admitting: Speech Pathology

## 2019-04-24 DIAGNOSIS — R498 Other voice and resonance disorders: Secondary | ICD-10-CM

## 2019-04-24 NOTE — Patient Instructions (Signed)
   Continue all voice and abdominal breathing exercises  This week, practice conversation with breathing and volume 10-15 minutes a day (as you are able)  If you need a reminder, you can use a loose rubber band on your hand to remind you to belly breathe and project your voice  When you do your high and low pitch sentences, breathe before each sentence, focus on loud volume  Add laryngeal breath hold "Huh" with mouth open, holding your breath with your vocal folds for 5-7 seconds, 15x twice a day -   Look up Laryngeal Pharyngeal Reflux and see what you think. The ENT did see affects of reflux in your larynx

## 2019-04-24 NOTE — Therapy (Signed)
[facility] [address] [place], [place], [address] Phone: [phone]   Fax:  [phone]  Speech Language Pathology Treatment  Patient Details  Name: Barry Pope MRN: [id number] Date of Birth: [birth year] Referring Provider (SLP): Dr. Gavin Pound   Encounter Date: 04/24/2019  End of Session - 04/24/19 0855    Visit Number  4    Number of Visits  17    Date for SLP Re-Evaluation  06/05/19    SLP Start Time  0803    SLP Stop Time   0845    SLP Time Calculation (min)  42 min    Activity Tolerance  Patient tolerated treatment well       History reviewed. No pertinent past medical history.  History reviewed. No pertinent surgical history.  There were no vitals filed for this visit.  Subjective Assessment - 04/24/19 0807    Subjective  "I seemed to have gotten better after the 1st session, now I have plataued"    Currently in Pain?  No/denies            ADULT SLP TREATMENT - 04/24/19 0807      General Information   Behavior/Cognition  Alert;Cooperative;Pleasant mood    Patient Positioning  Upright in chair      Treatment Provided   Treatment provided  Cognitive-Linquistic      Cognitive-Linquistic Treatment   Treatment focused on  Voice    Skilled Treatment  HEP for voice with rare min A for abdominal breathing and volume in sentences. Abdominal breathing and volume in paragraph reading with supervision cues. In conversation, pt required tactile cues (hand on abdomen) and occasional min visual and verbal cues for volume. When pt increases vocal intensity, phonation is WNL without horaseness or raspiness. Pt afirmed he does feel like he is talking too loud, educated pt that he is not shouting and increased vocal intensity is required to normalize voice quality. Reviewed ENT's observation of LPR. Educated pt re: LPR and provided handouts. Pt is taking PPI prn,  only when he "feels" reflux in  his throat or chest. Re-Instructed pt on reflux precautions. Added larygeal breath hold to HEP. Pt to focus on practicing abdmonial breathing and vocal intensity during conversations with his spouse this week.       Assessment / Recommendations / Plan   Plan  Continue with current plan of care      Progression Toward Goals   Progression toward goals  Progressing toward goals       SLP Education - 04/24/19 0853    Education Details  compensations for dysphonia; abdominal breathing, LPR, HEP for voice    Person(s) Educated  Patient    Methods  Explanation;Demonstration;Tactile cues;Verbal cues;Handout    Comprehension  Verbalized understanding;Returned demonstration;Verbal cues required;Need further instruction       SLP Short Term Goals - 04/24/19 0854      SLP SHORT TERM GOAL #1   Title  Pt will perfrom vocal function exercises with rare min A over 3 sessions    Time  3    Period  Weeks    Status  On-going      SLP SHORT TERM GOAL #2   Title  Pt will report/demonstrate carryover in use of throat clear alternatives with min A over 3 sessions.    Time  3    Period  Weeks    Status  On-going      SLP SHORT TERM GOAL #3  Title  Pt will utilize abdominal breathing and vocal intensity in structured tasks a sentence level with rare min A over 2 sessions    Time  3    Period  Weeks    Status  On-going       SLP Long Term Goals - 04/24/19 0854      SLP LONG TERM GOAL #1   Title  Pt will complete vocal function exercises with mod I over 3 sessions    Time  7    Period  Weeks    Status  On-going      SLP LONG TERM GOAL #2   Title  Pt wil demonstrate abdominal breathing and vocal intensity over 10 minute moderately complex conversation.    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 Continued training in HEP for voice and abdominal breathing. Pt with good success at rest and short sentences, cues required for carryover in spontaneous speech. Pt educated on proper breath support for volume and phonation. 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 I recommend skilled ST for improved vocal quality, function and endurance to maximize intelligibilty.    Speech Therapy Frequency  2x / week    Duration  --   8 weeks or 17 visits   Treatment/Interventions  Aspiration precaution training;Cueing hierarchy;Functional tasks;Patient/family education;Compensatory strategies;Environmental controls;Compensatory techniques;Internal/external aids;SLP instruction and feedback;Diet toleration management by SLP       Patient will benefit from skilled therapeutic intervention in order to improve the following deficits and impairments:   1. Other voice and resonance disorders       Problem List There are no active problems to display for this patient.   Tyese Finken, Annye Rusk MS, [employer] 04/24/2019, 8:56 AM  [facility] [address] [place], [place], [address] Phone: [phone]   Fax:  [phone]   Name: Barry Pope MRN: [id number] Date of Birth: November 19, 1945

## 2019-04-25 ENCOUNTER — Ambulatory Visit: Payer: PPO | Admitting: Speech Pathology

## 2019-04-25 DIAGNOSIS — R498 Other voice and resonance disorders: Secondary | ICD-10-CM

## 2019-04-25 NOTE — Therapy (Signed)
[facility] [address] [place], [place], [address] Phone: [phone]   Fax:  [phone]  Speech Language Pathology Treatment  Patient Details  Name: Barry Pope MRN: [id number] Date of Birth: Feb 15, 1946 Referring Provider (SLP): Dr. Gavin Pound   Encounter Date: 04/25/2019  End of Session - 04/25/19 0853    Visit Number  5    Number of Visits  17    Date for SLP Re-Evaluation  06/05/19    SLP Start Time  0806    SLP Stop Time   3825    SLP Time Calculation (min)  38 min    Activity Tolerance  Patient tolerated treatment well       No past medical history on file.  No past surgical history on file.  There were no vitals filed for this visit.  Subjective Assessment - 04/25/19 0810    Subjective  "Last night I had a 20 minute conversation in the driveway with my neighbor."    Currently in Pain?  No/denies            ADULT SLP TREATMENT - 04/25/19 0852      General Information   Behavior/Cognition  Alert;Cooperative;Pleasant mood      Treatment Provided   Treatment provided  Cognitive-Linquistic      Cognitive-Linquistic Treatment   Treatment focused on  Voice    Skilled Treatment  Pt performed HEP including breath hold with mod I. Pt generated sentences with AB and adequate vocal intensity 19/20 sentences, aware of 1 instance of sub- WNL intensity (cognitive load impacting). SLP took pt to noisy environment (outdoors). While walking pt required occasional min A to increase effort/intensity. More successful in 10 minutes conversation while stationary (rare min A).      Assessment / Recommendations / Plan   Plan  Continue with current plan of care      Progression Toward Goals   Progression toward goals  Progressing toward goals       SLP Education - 04/25/19 0853    Education Details  more effort may feel like shouting but intensity is WNL    Person(s) Educated  Patient    Methods  Explanation    Comprehension  Verbalized understanding       SLP Short Term Goals - 04/25/19 0814      SLP SHORT TERM GOAL #1   Title  Pt will perfrom vocal function exercises with rare min A over 3 sessions    Baseline  04/25/19    Time  3    Period  Weeks    Status  On-going      SLP SHORT TERM GOAL #2   Title  Pt will report/demonstrate carryover in use of throat clear alternatives with min A over 3 sessions.    Time  3    Period  Weeks    Status  On-going      SLP SHORT TERM GOAL #3   Title  Pt will utilize abdominal breathing and vocal intensity in structured tasks a sentence level with rare min A over 2 sessions    Baseline  04/25/19    Time  3    Period  Weeks    Status  On-going       SLP Long Term Goals - 04/25/19 0817      SLP LONG TERM GOAL #1   Title  Pt will complete vocal function exercises with mod I over 3 sessions  Baseline  04/25/19    Time  7    Period  Weeks    Status  On-going      SLP LONG TERM GOAL #2   Title  Pt wil demonstrate abdominal breathing and vocal intensity over 10 minute moderately complex conversation.    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 Continued training in HEP for voice and abdominal breathing. Pt with good success at rest and short sentences, with good carryover to 10 minutes conversation outdoors today (rare min A). Pt educated on proper breath support for volume and phonation. 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 I recommend skilled ST for improved vocal quality, function and  endurance to maximize intelligibilty.    Speech Therapy Frequency  2x / week    Duration  --   8 weeks or 17 visits   Treatment/Interventions  Aspiration precaution training;Cueing hierarchy;Functional tasks;Patient/family education;Compensatory strategies;Environmental controls;Compensatory techniques;Internal/external aids;SLP instruction and feedback;Diet toleration management by SLP       Patient will benefit from skilled therapeutic intervention in order to improve the following deficits and impairments:   1. Other voice and resonance disorders       Problem List There are no active problems to display for this patient.  Deneise Lever, [place], CCC-SLP Speech-Language Pathologist  Aliene Altes 04/25/2019, 8:54 AM  [facility] [address] [place], [place], [address] Phone: [phone]   Fax:  [phone]   Name: ARNELL SLIVINSKI MRN: [id number] Date of Birth: 04-17-1946

## 2019-04-29 ENCOUNTER — Ambulatory Visit: Payer: PPO | Admitting: Speech Pathology

## 2019-04-29 ENCOUNTER — Encounter: Payer: Self-pay | Admitting: Speech Pathology

## 2019-04-29 ENCOUNTER — Other Ambulatory Visit: Payer: Self-pay

## 2019-04-29 DIAGNOSIS — R498 Other voice and resonance disorders: Secondary | ICD-10-CM

## 2019-04-29 NOTE — Therapy (Signed)
[facility] [address] [place], [place], [address] Phone: [phone]   Fax:  [phone]  Speech Language Pathology Treatment  Patient Details  Name: Barry Pope MRN: [id number] Date of Birth: [birth year] Referring Provider (SLP): Dr. Gavin Pound   Encounter Date: 04/29/2019  End of Session - 04/29/19 0855    Visit Number  6    Number of Visits  17    Date for SLP Re-Evaluation  06/05/19    SLP Start Time  0804    SLP Stop Time   5993    SLP Time Calculation (min)  39 min       History reviewed. No pertinent past medical history.  History reviewed. No pertinent surgical history.  There were no vitals filed for this visit.  Subjective Assessment - 04/29/19 0809    Subjective  "It's better - my main problem is the discipline not to trail off at the end of the sentence"    Currently in Pain?  No/denies            ADULT SLP TREATMENT - 04/29/19 0809      General Information   Behavior/Cognition  Alert;Cooperative;Pleasant mood      Treatment Provided   Treatment provided  Cognitive-Linquistic      Cognitive-Linquistic Treatment   Treatment focused on  Voice    Skilled Treatment  Pt completed HEP for voice with mod I. He reports practicing using abdmoninal breathing and increased vocal intensity at home, but continues to require cues from his spouse as his voice becomes "raspy" when increased volume is not employed. Reviewed LPR precautions. Pt reports forgetting to use alternatives to throat clearing at home and is clearing his throat with some frequency. He has not cleared his throat in ST today or my prior visit with him. In noisy environment walking outside of clinic, pt required rare min A for intellgilbiity/volume. This week he is to focus on being aware of noise in his environment and using enough volume to be intelligilble over his environment.       Assessment / Recommendations /  Plan   Plan  Continue with current plan of care      Progression Toward Goals   Progression toward goals  Progressing toward goals       SLP Education - 04/29/19 0849    Education Details  continue 2x day HEP; continue reducing throat clears and LPR precautions; talk over background noise - be aware of this    Person(s) Educated  Patient    Methods  Explanation;Demonstration;Verbal cues;Handout    Comprehension  Verbalized understanding;Returned demonstration       SLP Short Term Goals - 04/29/19 0853      SLP SHORT TERM GOAL #1   Title  Pt will perfrom vocal function exercises with rare min A over 3 sessions    Baseline  04/25/19; 04/29/19    Time  2    Period  Weeks    Status  On-going      SLP SHORT TERM GOAL #2   Title  Pt will report/demonstrate carryover in use of throat clear alternatives with min A over 3 sessions.    Time  2    Period  Weeks    Status  On-going      SLP SHORT TERM GOAL #3   Title  Pt will utilize abdominal breathing and vocal intensity in structured tasks a sentence level with rare min A over 2 sessions  Baseline  04/25/19; 04/29/19    Time  3    Period  Weeks    Status  Achieved       SLP Long Term Goals - 04/29/19 4562      SLP LONG TERM GOAL #1   Title  Pt will complete vocal function exercises with mod I over 3 sessions    Baseline  04/25/19; 04/29/19    Time  6    Period  Weeks    Status  On-going      SLP LONG TERM GOAL #2   Title  Pt wil demonstrate abdominal breathing and vocal intensity over 10 minute moderately complex conversation.    Time  6    Period  Weeks    Status  On-going      SLP LONG TERM GOAL #3   Title  Pt will demonstrate WFL vocal qualtiy and endurance 10 minutes of conversation in noisy environment outside of treatment room with mod I over 2 sessions    Time  6    Period  Weeks    Status  On-going       Plan - 04/29/19 5638    Clinical Impression Statement  Pt presents with dysphonia secondary to  age-related vocal fold atrophy. Continued training in HEP for voice and abdominal breathing. Pt with good success at rest and short sentences, with good carryover to 10 minutes conversation outdoors today (rare min A). Pt educated on proper breath support for volume and phonation. 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 I recommend skilled ST for improved vocal quality, function and endurance to maximize intelligibilty. Pt is going to the beach next week. He will be busy packing and preparing for this. As his progress continues and he is mod I in HEP and spouse is cueing him for volume, pt will cancel his Thursday appointment. After he returns from the beach, we will re-assess his progress and my consider decreasing to 1x a week if progress continues.    Speech Therapy Frequency  2x / week    Duration  --   8 weeks or 17 visits   Treatment/Interventions  Aspiration precaution training;Cueing hierarchy;Functional tasks;Patient/family education;Compensatory strategies;Environmental controls;Compensatory techniques;Internal/external aids;SLP instruction and feedback;Diet toleration management by SLP       Patient will benefit from skilled therapeutic intervention in order to improve the following deficits and impairments:   1. Other voice and resonance disorders       Problem List There are no active problems to display for this patient.   Gina Leblond, Annye Rusk MS, CCC-SLP 04/29/2019, 8:56 AM  [facility] [address] [place], [place], [address] Phone: [phone]   Fax:  [phone]   Name: Barry Pope MRN: [id number] Date of Birth: 15-May-1946

## 2019-04-29 NOTE — Patient Instructions (Signed)
  This week, focus on reducing throat clears and noticing when your voice trails off or becomes raspy - and fix it!  Continue your exercises 2x daily, continue practicing speaking with intent and belly breathing in conversations  At the beach - notice the noise in the room and on the beach - use strong volume to speak over the noise as needed  Continue to manage your reflux  Great Job with keeping up your exercises and practice!!  Have fun at the beach - if your voice holds up, we can consider cutting down your last 2 weeks to 1x a week

## 2019-05-01 ENCOUNTER — Ambulatory Visit: Payer: PPO | Admitting: Speech Pathology

## 2019-05-13 ENCOUNTER — Ambulatory Visit: Payer: PPO | Attending: Otolaryngology

## 2019-05-13 ENCOUNTER — Other Ambulatory Visit: Payer: Self-pay

## 2019-05-13 DIAGNOSIS — R498 Other voice and resonance disorders: Secondary | ICD-10-CM

## 2019-05-13 NOTE — Patient Instructions (Signed)
Consider an "external cue" - rubber band around wrist, watch on opposite hand, etc.

## 2019-05-13 NOTE — Therapy (Signed)
[facility] [address] [place], [place], [address] Phone: [phone]   Fax:  [phone]  Speech Language Pathology Treatment  Patient Details  Name: Barry Pope MRN: [id number] Date of Birth: 1946/07/25 Referring Provider (SLP): Dr. Gavin Pound   Encounter Date: 05/13/2019  End of Session - 05/13/19 0931    Visit Number  7    Number of Visits  17    Date for SLP Re-Evaluation  06/05/19    SLP Start Time  0852   checked in 0851      History reviewed. No pertinent past medical history.  History reviewed. No pertinent surgical history.  There were no vitals filed for this visit.  Subjective Assessment - 05/13/19 0856    Subjective  "I took a few days off on vacation."    Currently in Pain?  No/denies            ADULT SLP TREATMENT - 05/13/19 0857      General Information   Behavior/Cognition  Alert;Cooperative;Pleasant mood      Treatment Provided   Treatment provided  Cognitive-Linquistic      Cognitive-Linquistic Treatment   Treatment focused on  Voice    Skilled Treatment  Pt stated he cont'd having difficulty with throat clearing - SLP educated pt with hard onset "mmm mm mmm". Pt did not complete HEP in totality last week. Pt completed HEP with modified independence. Pt has been completing 10 "AH", 10 pitch raises up and down, ten sentences with high pitch and low pitch (each), and vocal adduction. Pt ?s whether or not laryngeal massage is still relevant. SLP told pt to ask primary SLP Wednesday. Outdoors, pt maintained WNL volume with rare min A - SLP suggested external cue and pt told SLP primary SLP had already told him about this and he was thikning of implementing. Pt questions reduction in frequency - he understood when SLP told him it would be best to speak to primary SLP about this on Wednesday.Pt without throat clearing today in session.       Assessment / Recommendations /  Plan   Plan  Continue with current plan of care      Progression Toward Goals   Progression toward goals  Progressing toward goals       SLP Education - 05/13/19 0930    Education Details  hard "mmm mm mmm" strategy for throat clearing    Person(s) Educated  Patient    Methods  Explanation;Demonstration    Comprehension  Verbalized understanding       SLP Short Term Goals - 05/13/19 0934      SLP SHORT TERM GOAL #1   Title  Pt will perfrom vocal function exercises with rare min A over 3 sessions    Period  Weeks    Status  Achieved      SLP SHORT TERM GOAL #2   Title  Pt will report/demonstrate carryover in use of throat clear alternatives with min A over 3 sessions.    Time  1    Period  Weeks    Status  On-going      SLP SHORT TERM GOAL #3   Title  Pt will utilize abdominal breathing and vocal intensity in structured tasks a sentence level with rare min A over 2 sessions    Baseline  04/25/19; 04/29/19    Time  2    Period  Weeks    Status  Achieved  SLP Long Term Goals - 05/13/19 0934      SLP LONG TERM GOAL #1   Title  Pt will complete vocal function exercises with mod I over 3 sessions    Baseline  04/25/19; 04/29/19    Status  Achieved      SLP LONG TERM GOAL #2   Title  Pt wil demonstrate abdominal breathing and vocal intensity over 10 minute moderately complex conversation.    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 Continued training in HEP for voice and abdominal breathing. Pt with good success in sentences, with good carryover to 12 minutes conversation  outdoors today (rare min A). Pt educated on hard "mmm" as alternative to throat clearing. Pt questions reduction in frequency - I referred him to primary SLP on Wednesday. I recommend skilled ST for improved vocal quality, function and endurance to maximize intelligibilty. Pt is going to the beach next week. He will be busy packing and preparing for this. As his progress continues and he is mod I in HEP and spouse is cueing him for volume, pt will cancel his Thursday appointment. After he returns from the beach, we will re-assess his progress and my consider decreasing to 1x a week if progress continues.    Speech Therapy Frequency  2x / week    Duration  --   8 weeks or 17 visits   Treatment/Interventions  Aspiration precaution training;Cueing hierarchy;Functional tasks;Patient/family education;Compensatory strategies;Environmental controls;Compensatory techniques;Internal/external aids;SLP instruction and feedback;Diet toleration management by SLP       Patient will benefit from skilled therapeutic intervention in order to improve the following deficits and impairments:   1. Other voice and resonance disorders       Problem List There are no active problems to display for this patient.   [facility] ,[place], [facility]  05/13/2019, 9:35 AM  [facility] [address] [place], [place], [address] Phone: [phone]   Fax:  [phone]   Name: Barry Pope MRN: [id number] Date of Birth: March 17, 1946

## 2019-05-15 ENCOUNTER — Encounter: Payer: Self-pay | Admitting: Speech Pathology

## 2019-05-15 ENCOUNTER — Ambulatory Visit: Payer: PPO | Admitting: Speech Pathology

## 2019-05-15 ENCOUNTER — Other Ambulatory Visit: Payer: Self-pay

## 2019-05-15 DIAGNOSIS — R498 Other voice and resonance disorders: Secondary | ICD-10-CM | POA: Diagnosis not present

## 2019-05-15 NOTE — Patient Instructions (Signed)
  Continue voice exercises at least 5/7 days   Include straw warm ups throughout the day  Keep practicing good volume and breathing in conversation  When you get gravelly - take a big breath and power your volume up  [place] job doing the exercises and practicing your loud voice  Keep an eye on your voice, if you have any long term decline, let the doctor know

## 2019-05-15 NOTE — Therapy (Signed)
[facility] [address] [place], [place], [address] Phone: [phone]   Fax:  [phone]  Speech Language Pathology Treatment & Discharge Summary  Patient Details  Name: Barry Pope MRN: [id number] Date of Birth: 1946-01-23 Referring Provider (SLP): Dr. Gavin Pound   Encounter Date: 05/15/2019  End of Session - 05/15/19 0924    SLP Start Time  0845    SLP Stop Time   0920    SLP Time Calculation (min)  35 min    Activity Tolerance  Patient tolerated treatment well       History reviewed. No pertinent past medical history.  History reviewed. No pertinent surgical history.  There were no vitals filed for this visit.  Subjective Assessment - 05/15/19 0851    Subjective  "It still gets gravely if I run out of air"    Currently in Pain?  No/denies            ADULT SLP TREATMENT - 05/15/19 0852      General Information   Behavior/Cognition  Alert;Cooperative;Pleasant mood      Treatment Provided   Treatment provided  Cognitive-Linquistic      Cognitive-Linquistic Treatment   Treatment focused on  Voice    Skilled Treatment  Pt continues to report "trailing off" when he is talking with his wife on occassion. He reports success with other family members and in community settings.. Pt completed HEP with mod I. Pt utilized adequate volume and breath support for 20 minute conversation with supervisoin cues.       Assessment / Recommendations / Plan   Plan  Discharge SLP treatment due to (comment)   goals met     Progression Toward Goals   Progression toward goals  Goals met, education completed, patient discharged from [facility] - 05/15/19 0920    Education Details  continue voice exercises and abdominal breathing 5/7 days a week; continue good volume    Person(s) Educated  Patient    Methods  Explanation;Demonstration;Handout    Comprehension  Verbalized  understanding;Returned demonstration       SPEECH THERAPY DISCHARGE SUMMARY  Visits from Start of Care: 8  Current functional level related to goals / functional outcomes: See goals below   Remaining deficits: Dysphonia    Education / Equipment: HEP for voice; abdominal breathing; vocal hygiene, volume Plan: Patient agrees to discharge.  Patient goals were met. Patient is being discharged due to meeting the stated rehab goals.  ?????         SLP Short Term Goals - 05/15/19 4287      SLP SHORT TERM GOAL #1   Title  Pt will perfrom vocal function exercises with rare min A over 3 sessions    Period  Weeks    Status  Achieved      SLP SHORT TERM GOAL #2   Title  Pt will report/demonstrate carryover in use of throat clear alternatives with min A over 3 sessions.    Time  1    Period  Weeks    Status  On-going      SLP SHORT TERM GOAL #3   Title  Pt will utilize abdominal breathing and vocal intensity in structured tasks a sentence level with rare min A over 2 sessions    Baseline  04/25/19; 04/29/19    Time  2    Period  Weeks    Status  Achieved  SLP Long Term Goals - 05/15/19 4709      SLP LONG TERM GOAL #1   Title  Pt will complete vocal function exercises with mod I over 3 sessions    Baseline  04/25/19; 04/29/19    Status  Achieved      SLP LONG TERM GOAL #2   Title  Pt wil demonstrate abdominal breathing and vocal intensity over 10 minute moderately complex conversation.    Baseline  05-13-19    Time  5    Period  Weeks    Status  Achieved      SLP LONG TERM GOAL #3   Title  Pt will demonstrate WFL vocal qualtiy and endurance 10 minutes of conversation in noisy environment outside of treatment room with mod I over 2 sessions    Time  5    Period  Weeks    Status  Achieved       Plan - 05/15/19 0921    Clinical Impression Statement  Pt completing HEP for voice with mod I. He demonstrates adequate volume, clear phonation and vocal endurance over 25  minutes in mildly noisy environment. Pt is pleased with his current level of function. Recommend d/c ST at this time. Pt to continue vocal exercises and speaking with intent daily. Pt in agreement.    Treatment/Interventions  Aspiration precaution training;Cueing hierarchy;Functional tasks;Patient/family education;Compensatory strategies;Environmental controls;Compensatory techniques;Internal/external aids;SLP instruction and feedback;Diet toleration management by SLP       Patient will benefit from skilled therapeutic intervention in order to improve the following deficits and impairments:   1. Other voice and resonance disorders       Problem List There are no active problems to display for this patient.   Lovvorn, Annye Rusk MS, CCC-SLP 05/15/2019, 9:25 AM  [facility] [address] [place], [place], [address] Phone: [phone]   Fax:  [phone]   Name: Barry Pope MRN: [id number] Date of Birth: 1946-09-15

## 2019-05-21 ENCOUNTER — Ambulatory Visit: Payer: PPO | Admitting: Speech Pathology

## 2019-05-23 ENCOUNTER — Ambulatory Visit: Payer: PPO | Admitting: Speech Pathology

## 2019-07-13 DIAGNOSIS — Z23 Encounter for immunization: Secondary | ICD-10-CM | POA: Diagnosis not present

## 2019-08-12 DIAGNOSIS — K219 Gastro-esophageal reflux disease without esophagitis: Secondary | ICD-10-CM | POA: Diagnosis not present

## 2019-08-12 DIAGNOSIS — J383 Other diseases of vocal cords: Secondary | ICD-10-CM | POA: Diagnosis not present

## 2019-08-12 DIAGNOSIS — Z7289 Other problems related to lifestyle: Secondary | ICD-10-CM | POA: Diagnosis not present

## 2019-08-27 DIAGNOSIS — Z974 Presence of external hearing-aid: Secondary | ICD-10-CM | POA: Diagnosis not present

## 2019-08-27 DIAGNOSIS — J385 Laryngeal spasm: Secondary | ICD-10-CM

## 2019-08-27 DIAGNOSIS — R490 Dysphonia: Secondary | ICD-10-CM | POA: Diagnosis not present

## 2019-08-27 DIAGNOSIS — Z8719 Personal history of other diseases of the digestive system: Secondary | ICD-10-CM | POA: Diagnosis not present

## 2019-08-27 DIAGNOSIS — Z973 Presence of spectacles and contact lenses: Secondary | ICD-10-CM | POA: Diagnosis not present

## 2019-08-27 DIAGNOSIS — Z7289 Other problems related to lifestyle: Secondary | ICD-10-CM | POA: Diagnosis not present

## 2019-08-27 DIAGNOSIS — J383 Other diseases of vocal cords: Secondary | ICD-10-CM | POA: Diagnosis not present

## 2019-08-27 DIAGNOSIS — K219 Gastro-esophageal reflux disease without esophagitis: Secondary | ICD-10-CM | POA: Diagnosis not present

## 2019-08-27 DIAGNOSIS — J300 Vasomotor rhinitis: Secondary | ICD-10-CM | POA: Diagnosis not present

## 2019-08-27 DIAGNOSIS — J310 Chronic rhinitis: Secondary | ICD-10-CM | POA: Diagnosis not present

## 2019-08-27 HISTORY — DX: Dysphonia: R49.0

## 2019-08-27 HISTORY — DX: Laryngeal spasm: J38.5

## 2019-10-14 DIAGNOSIS — Z79899 Other long term (current) drug therapy: Secondary | ICD-10-CM | POA: Diagnosis not present

## 2019-10-14 DIAGNOSIS — Z125 Encounter for screening for malignant neoplasm of prostate: Secondary | ICD-10-CM | POA: Diagnosis not present

## 2019-10-15 DIAGNOSIS — R413 Other amnesia: Secondary | ICD-10-CM | POA: Diagnosis not present

## 2019-10-15 DIAGNOSIS — Z1211 Encounter for screening for malignant neoplasm of colon: Secondary | ICD-10-CM | POA: Diagnosis not present

## 2019-10-15 DIAGNOSIS — K219 Gastro-esophageal reflux disease without esophagitis: Secondary | ICD-10-CM | POA: Diagnosis not present

## 2019-10-15 DIAGNOSIS — J383 Other diseases of vocal cords: Secondary | ICD-10-CM | POA: Diagnosis not present

## 2019-10-15 DIAGNOSIS — N529 Male erectile dysfunction, unspecified: Secondary | ICD-10-CM | POA: Diagnosis not present

## 2019-10-15 DIAGNOSIS — Z79899 Other long term (current) drug therapy: Secondary | ICD-10-CM | POA: Diagnosis not present

## 2019-10-15 DIAGNOSIS — J309 Allergic rhinitis, unspecified: Secondary | ICD-10-CM | POA: Diagnosis not present

## 2019-10-15 DIAGNOSIS — Z0000 Encounter for general adult medical examination without abnormal findings: Secondary | ICD-10-CM | POA: Diagnosis not present

## 2019-10-15 DIAGNOSIS — L719 Rosacea, unspecified: Secondary | ICD-10-CM | POA: Diagnosis not present

## 2019-10-15 DIAGNOSIS — G4700 Insomnia, unspecified: Secondary | ICD-10-CM | POA: Diagnosis not present

## 2019-10-15 DIAGNOSIS — M179 Osteoarthritis of knee, unspecified: Secondary | ICD-10-CM | POA: Diagnosis not present

## 2019-10-15 DIAGNOSIS — Z125 Encounter for screening for malignant neoplasm of prostate: Secondary | ICD-10-CM | POA: Diagnosis not present

## 2019-10-16 DIAGNOSIS — Z1211 Encounter for screening for malignant neoplasm of colon: Secondary | ICD-10-CM | POA: Diagnosis not present

## 2019-10-18 ENCOUNTER — Encounter: Payer: Self-pay | Admitting: Neurology

## 2019-10-21 DIAGNOSIS — I788 Other diseases of capillaries: Secondary | ICD-10-CM | POA: Diagnosis not present

## 2019-10-21 DIAGNOSIS — L821 Other seborrheic keratosis: Secondary | ICD-10-CM | POA: Diagnosis not present

## 2019-10-21 DIAGNOSIS — D225 Melanocytic nevi of trunk: Secondary | ICD-10-CM | POA: Diagnosis not present

## 2019-10-21 DIAGNOSIS — D2262 Melanocytic nevi of left upper limb, including shoulder: Secondary | ICD-10-CM | POA: Diagnosis not present

## 2019-10-21 DIAGNOSIS — Z85828 Personal history of other malignant neoplasm of skin: Secondary | ICD-10-CM | POA: Diagnosis not present

## 2019-10-21 DIAGNOSIS — D2272 Melanocytic nevi of left lower limb, including hip: Secondary | ICD-10-CM | POA: Diagnosis not present

## 2019-12-05 NOTE — Progress Notes (Addendum)
Barry Pope was seen today in the movement disorders clinic for neurologic consultation at the request of Mayra Neer, MD.  The consultation is for the evaluation of possible Parkinson's disease due to shuffling gait, lip tremor and family history of Parkinson's in father.  This patient is accompanied in the office by his spouse who supplements the history.    Specific Symptoms:  Tremor: No. per pt - wife notes some lip tremor and some R hand tremor when drinking out of a cup Family hx of similar:  Yes.  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  Voice therapy was recommended, which he had already completed in the past. Sleep: sleeps well  Vivid Dreams:  No.  Acting out dreams:  Yes.  , kicks/jerks out - doesn't fall out of the bed Wet Pillows: Yes.  , a little Postural symptoms:  Yes.    Falls?  No. Bradykinesia symptoms: slow movements - both denies shuffling Loss of smell:  Yes.   - lost this in 2000 - states that he attributed it to using nasal zicam  Loss of taste:  No. Urinary Incontinence:  No. Difficulty Swallowing:  No. Handwriting, micrographia: Yes.   Trouble with ADL's:  No.  Trouble buttoning clothing: No. (12/13/19 - pt called and asked for addendum stating he has trouble with this task) Depression:  No. but wife states that he is easily frustrated Memory changes:  Yes.  , mild with word finding trouble - no trouble with finances or remembering to take pills.   Hallucinations:  No.  visual distortions: No. N/V:  No. Lightheaded:  No. per pt but wife states that he gets occasional intermittent vertigo  Syncope: No. Diplopia:  No. Dyskinesia:  No. Prior exposure to reglan/antipsychotics: No.  Neuroimaging of the brain has not previously been performed.    PREVIOUS MEDICATIONS: none to date  ALLERGIES:  No Known Allergies  CURRENT  MEDICATIONS:  Current Outpatient Medications  Medication Instructions  . acyclovir (ZOVIRAX) 200 mg, Oral, 4 times daily PRN  . doxycycline (ORACEA) 40 mg, Oral, BH-each morning, As needed  . omeprazole (PRILOSEC) 20 mg, Oral, Every other day  . traZODone (DESYREL) 25 mg, Oral, Daily at bedtime, As needed    VITALS:   Vitals:   12/09/19 1011  BP: 139/79  Pulse: 63  SpO2: 99%  Weight: 161 lb (73 kg)  Height: 5\' 9"  (1.753 m)    GEN:  The patient appears stated age and is in NAD. HEENT:  Normocephalic, atraumatic.  The mucous membranes are moist. The superficial temporal arteries are without ropiness or tenderness. CV:  RRR Lungs:  CTAB Neck/HEME:  There are no carotid bruits bilaterally.  Neurological examination:  Orientation: The patient is alert and oriented x3.  Cranial nerves: There is good facial symmetry. There is facial hypomimia.  Extraocular muscles are intact. The visual fields are full to confrontational testing. The speech is fluent and clear.  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  The patient is able to make the gutteral sounds without difficulty.  Soft palate rises symmetrically and there is no tongue deviation. Hearing is intact to conversational tone. Sensation: Sensation is intact to light and pinprick throughout (facial, trunk, extremities). Vibration is intact at the bilateral big toe. There is no extinction with double simultaneous stimulation. There is no sensory dermatomal level identified. Motor: Strength is 5/5 in  the bilateral upper and lower extremities.   Shoulder shrug is equal and symmetric.  There is no pronator drift. Deep tendon reflexes: Deep tendon reflexes are 2/4 at the bilateral biceps, triceps, brachioradialis, patella and achilles. Plantar responses are downgoing bilaterally.  Movement examination: Tone: There is mild increased tone in the RUE.  Tone elsewhere is normal Abnormal movements: none seen, but can feel a little bit of tremor  on the right with distraction Coordination:  There is mild decremation with RAM's, only with finger taps on the R.   Gait and Station: The patient has no difficulty arising out of a deep-seated chair without the use of the hands. The patient's stride length is good with decreased arm swing on the right.    I have reviewed and interpreted the following labs independently Patient had lab work completed on October 14, 2019.  Sodium was 138, potassium 4.7, chloride 105, CO2 28, BUN 13, creatinine 1.06, glucose 103, AST 16, ALT 14  ASSESSMENT/PLAN:  1.  Parkinsonism.  I suspect that this does represent idiopathic Parkinson's disease.    -We discussed the diagnosis as well as pathophysiology of the disease.  We discussed treatment options as well as prognostic indicators.  Patient education was provided.  -We discussed that it used to be thought that levodopa would increase risk of melanoma but now it is believed that Parkinsons itself likely increases risk of melanoma. he is to get regular skin checks.  -We discussed treatment options.  Ultimately, the patient decided to hold off on any medication for now.   -Patient will have TSH done today.  -We discussed community resources in the area including patient support groups and community exercise programs for PD and pt education was provided to the patient.  -Discussed genetic testing and the role currently in the treatment/diagnosis with Parkinson's, given his family history.  They are interested in this.  Will send his information to [facility]  -We will do an MRI of the brain.  Discussed with patient that we will likely see small vessel disease and probably atrophy.  Just want to make sure we are not missing anything more.  2.  Memory loss  -Patient's wife is more concerned than the patient.  Discussed neurocognitive testing.  We will get that scheduled.  3.  Follow-up within the next 6 months, sooner should new neurologic issues arise.  Total time  spent on today's visit was 70 minutes, including both face-to-face time and nonface-to-face time.  Time included that spent on review of records (prior notes available to me/labs/imaging if pertinent), discussing treatment and goals, answering patient's questions and coordinating care.  Cc:  Mayra Neer, MD

## 2019-12-06 ENCOUNTER — Encounter: Payer: Self-pay | Admitting: Neurology

## 2019-12-06 DIAGNOSIS — T7840XA Allergy, unspecified, initial encounter: Secondary | ICD-10-CM | POA: Insufficient documentation

## 2019-12-09 ENCOUNTER — Ambulatory Visit: Payer: PPO | Admitting: Neurology

## 2019-12-09 ENCOUNTER — Encounter: Payer: Self-pay | Admitting: Neurology

## 2019-12-09 ENCOUNTER — Other Ambulatory Visit: Payer: Self-pay

## 2019-12-09 ENCOUNTER — Ambulatory Visit (INDEPENDENT_AMBULATORY_CARE_PROVIDER_SITE_OTHER): Payer: PPO | Admitting: Clinical

## 2019-12-09 ENCOUNTER — Other Ambulatory Visit: Payer: PPO

## 2019-12-09 VITALS — BP 139/79 | HR 63 | Ht 69.0 in | Wt 161.0 lb

## 2019-12-09 DIAGNOSIS — G20 Parkinson's disease: Secondary | ICD-10-CM

## 2019-12-09 DIAGNOSIS — G20A1 Parkinson's disease without dyskinesia, without mention of fluctuations: Secondary | ICD-10-CM | POA: Insufficient documentation

## 2019-12-09 DIAGNOSIS — R2681 Unsteadiness on feet: Secondary | ICD-10-CM

## 2019-12-09 DIAGNOSIS — R251 Tremor, unspecified: Secondary | ICD-10-CM | POA: Diagnosis not present

## 2019-12-09 DIAGNOSIS — R413 Other amnesia: Secondary | ICD-10-CM | POA: Diagnosis not present

## 2019-12-09 HISTORY — DX: Parkinson's disease: G20

## 2019-12-09 HISTORY — DX: Parkinson's disease without dyskinesia, without mention of fluctuations: G20.A1

## 2019-12-09 LAB — TSH: TSH: 1.66 mIU/L (ref 0.40–4.50)

## 2019-12-09 NOTE — Patient Instructions (Signed)
Your provider has requested that you have labwork completed today. Please go to [facility] ([address]) on the second floor of this building before leaving the office today. You do not need to check in. If you are not called within 15 minutes please check with the front desk.   You have been referred for a neurocognitive evaluation in our office.   The evaluation has two parts.   . The first part of the evaluation is a clinical interview with the neuropsychologist (Dr. Melvyn Novas or Dr. Nicole Kindred). Please bring someone with you to this appointment if possible, as it is helpful for the doctor to hear from both you and another adult who knows you well.   . The second part of the evaluation is testing with the doctor's technician Hinton Dyer or Maudie Mercury). The testing includes a variety of tasks- mostly question-and-answer, some paper-and-pencil. There is nothing you need to do to prepare for this appointment, but having a good night's sleep prior to the testing, taking medications as you normally would, and bringing eyeglasses and hearing aids (if you wear them), is advised. Please make sure that you wear a mask to the appointment.  Please note: We have to reserve several hours of the neuropsychologist's time and the psychometrician's time for your evaluation appointment. As such, please note that there is a No-Show fee of $100. If you are unable to attend any of your appointments, please contact our office as soon as possible to reschedule..  We have sent a referral to [facility] for your MRI and they will call you directly to schedule your appointment. They are located at [place]. If you need to contact them directly please call [phone].

## 2019-12-09 NOTE — BH Specialist Note (Signed)
Referring Provider: Alonza Bogus, DO Date of Referral: 12/09/19 Primary Reason for Referral: New dx Parkinson's Disease Location of Visit: Individual, office visit  Suicide/Homicide Risk: Pt denies risk  Subjective Notes: Mild tremor  Accepting of PD dx Hx cycling pre-covid, current interval exercise routine, motivated for exercise  Psychosocial Assessment Patient presents today for psychoeducation with LCSW following with new diagnosis of Parkinson's Disease by referring provider Dr. Wells Guiles Tat. LCSW provided brief patient education on motor and nonmotor Parkinson's symptoms such as apathy, depression, incontinence/constipation, sleep behavior disorders, communication and cognitive impairment. LCSW provided supportive counseling as pt reports h/o cognitive & speech impairment, decline in mobility-balance .  LCSW provided pt with information about our support and educational groups for patients with Parkinson's as well as their care partners, also discussed the importance of forced intense exercise in the management of PD and provided information about exercise opportunities.  Also discussed availability of individual counseling sessions to address the adjustment of living with chronic disease of Parkinson's, invited patient to schedule with LCSW as desired. To note, pt has neuropsy testing in ~49mo that may inform care. Pt responded receptively to patient education today.   Brief Interventions provided today in session 1. psychoeducation, patient education 2. Supportive counseling   Plan 1. Feel free to contact LCSW with any questions related to Parkinson's & behavioral health.  2. Goal for increased exercise with focus on increasing intensity, pt interested in cycling-exercise and support group resources provided  Behavioral Health treatment recommendations communicated to referring provider and pt states agreement with plan. LCSW will remain available for future consultation.

## 2019-12-13 ENCOUNTER — Telehealth: Payer: Self-pay | Admitting: Neurology

## 2019-12-13 NOTE — Telephone Encounter (Signed)
Patient states he does not recall a conversation about the pros and cons of medication that was stated in the note.   Another thing patient wants to address is that he is having trouble buttoning clothing and he wants to know if you can update that as well.

## 2019-12-13 NOTE — Telephone Encounter (Signed)
Again, I had already updated the note.  Regarding the buttoning, that is what he told me on the exam date, but will update to reflect that is what he says now.

## 2019-12-13 NOTE — Telephone Encounter (Signed)
Patient called and said he has questions about his after visit summary from his new patient visit on 12/09/19 with Dr. Carles Collet.   He said it is not clear about medication treatment and there are conflicting statements. He wants to be certain of his plan.

## 2019-12-13 NOTE — Telephone Encounter (Signed)
There was nothing conflicting on the AVS but he is correct about my note and I have made an addendum and corrected that he was not started on medication.  Thank you for the correction

## 2019-12-13 NOTE — Telephone Encounter (Signed)
Ok

## 2019-12-17 DIAGNOSIS — R251 Tremor, unspecified: Secondary | ICD-10-CM | POA: Diagnosis not present

## 2019-12-17 DIAGNOSIS — G20 Parkinson's disease: Secondary | ICD-10-CM | POA: Diagnosis not present

## 2019-12-17 DIAGNOSIS — R2681 Unsteadiness on feet: Secondary | ICD-10-CM | POA: Diagnosis not present

## 2019-12-17 DIAGNOSIS — R413 Other amnesia: Secondary | ICD-10-CM | POA: Diagnosis not present

## 2020-01-01 ENCOUNTER — Telehealth: Payer: Self-pay | Admitting: Neurology

## 2020-01-01 NOTE — Telephone Encounter (Signed)
Let pt know that the [facility] genetic testing for hereditary forms of Parkinsons Disease came back today and were negative.  No known genetic abnormalities seen.  That doesn't change his diagnosis

## 2020-01-02 ENCOUNTER — Ambulatory Visit
Admission: RE | Admit: 2020-01-02 | Discharge: 2020-01-02 | Disposition: A | Discharge status: Home / Self Care | Payer: PPO | Source: Ambulatory Visit | Attending: Neurology | Admitting: Neurology

## 2020-01-02 ENCOUNTER — Other Ambulatory Visit: Payer: Self-pay

## 2020-01-02 DIAGNOSIS — R413 Other amnesia: Secondary | ICD-10-CM | POA: Diagnosis not present

## 2020-01-02 NOTE — Telephone Encounter (Signed)
Left message for patient to call back to inform him of negative genetic results.

## 2020-01-03 NOTE — Telephone Encounter (Signed)
Patient aware of genetic results.

## 2020-01-07 DIAGNOSIS — G20 Parkinson's disease: Secondary | ICD-10-CM | POA: Diagnosis not present

## 2020-01-07 DIAGNOSIS — Z85060 Personal history of malignant carcinoid tumor of small intestine: Secondary | ICD-10-CM | POA: Diagnosis not present

## 2020-01-07 DIAGNOSIS — Z8719 Personal history of other diseases of the digestive system: Secondary | ICD-10-CM | POA: Diagnosis not present

## 2020-01-07 DIAGNOSIS — R490 Dysphonia: Secondary | ICD-10-CM | POA: Diagnosis not present

## 2020-01-07 DIAGNOSIS — J383 Other diseases of vocal cords: Secondary | ICD-10-CM | POA: Diagnosis not present

## 2020-01-13 ENCOUNTER — Other Ambulatory Visit: Payer: Self-pay

## 2020-01-13 ENCOUNTER — Ambulatory Visit: Payer: PPO | Admitting: Psychology

## 2020-01-13 ENCOUNTER — Encounter: Payer: Self-pay | Admitting: Psychology

## 2020-01-13 ENCOUNTER — Ambulatory Visit (INDEPENDENT_AMBULATORY_CARE_PROVIDER_SITE_OTHER): Payer: PPO | Admitting: Psychology

## 2020-01-13 DIAGNOSIS — R4189 Other symptoms and signs involving cognitive functions and awareness: Secondary | ICD-10-CM

## 2020-01-13 DIAGNOSIS — G20 Parkinson's disease: Secondary | ICD-10-CM

## 2020-01-13 NOTE — Progress Notes (Signed)
   Psychometrician Note   Cognitive testing was administered to Barry Pope by Milana Kidney, B.S. (psychometrist) under the supervision of Dr. Christia Reading, Ph.D., licensed psychologist. Barry Pope did not appear overtly distressed by the testing session per behavioral observation or responses across self-report questionnaires. Dr. Christia Reading, Ph.D. checked in with Barry Pope as needed to manage any distress related to testing procedures (if applicable). Rest breaks were offered.    The battery of tests administered was selected by Dr. Christia Reading, Ph.D. with consideration to Barry Pope's current level of functioning, the nature of his symptoms, emotional and behavioral responses during interview, level of literacy, observed level of motivation/effort, and the nature of the referral question. This battery was communicated to the psychometrist. Communication between Dr. Christia Reading, Ph.D. and the psychometrist was ongoing throughout the evaluation and Dr. Christia Reading, Ph.D. was immediately accessible at all times. Dr. Christia Reading, Ph.D. provided supervision to the psychometrist on the date of this service to the extent necessary to assure the quality of all services provided.    Barry Pope will return within approximately 1-2 weeks for an interactive feedback session with Dr. Melvyn Novas at which time his test performances, clinical impressions, and treatment recommendations will be reviewed in detail. Barry Pope understands he can contact our office should he require our assistance before this time.  A total of 145 minutes of billable time were spent face-to-face with Barry Pope by the psychometrist. This includes both test administration and scoring time. Billing for these services is reflected in the clinical report generated by Dr. Christia Reading, Ph.D..  This note reflects time spent with the psychometrician and does not include test scores or any  clinical interpretations made by Dr. Melvyn Novas. The full report will follow in a separate note.

## 2020-01-13 NOTE — Progress Notes (Signed)
NEUROPSYCHOLOGICAL EVALUATION [place] [place] [facility] of Neurology  Reason for Referral:   Barry Pope is a 74 y.o. right-handed Caucasian male referred by Alonza Bogus, D.O., to characterize his current cognitive functioning and assist with diagnostic clarity and treatment planning in the context of subjective cognitive dysfunction and a history of Parkinson's disease.  Assessment and Plan:   Clinical Impression(s): Overall, Mr. Gombos pattern of performance is suggestive of neuropsychological functioning within normal limits. Subtle performance variability was exhibited across processing speed; however scores were largely within appropriate normative ranges. An isolated relative weakness was exhibited across a task assessing spatial manipulation. However, this task included a motorized component and performance across other non-motorized spatial tasks was particularly strong. Overall, cognitive performance was appropriate across domains of processing speed, attention/concentration, executive functioning, receptive and expressive language, visuospatial functioning, and learning and memory.   As there is no prior testing for comparison purposes, it is possible that scores in the average normative range represent a subtle to mild decline from a previously higher level of functioning and that such decline could be caused by his history of Parkinson's disease. Cognitive deficits in Parkinson's disease commonly focus on processing speed, attention/concentration, executive functioning, and encoding/retrieval aspects of memory. However, scores may also merely represent longstanding strengths and weaknesses, as well as normal intraindividual variability across a lengthy battery of tests. It is also possible that the primary culprit regarding day-to-day cognitive inefficiencies is related to various environmental factors (e.g., various psychosocial stressors,  irritability/frustration stemming from COVID-19 isolation, fatigue, etc.). These factors can create instances where it is more likely for Mr. Million to have brief attentional lapses, leading to subtle to mild cognitive inefficiencies in his day-to-day life. There is no support for ADHD given childhood responses scoring in the "unlikely to have ADHD" range across a related questionnaire and Mr. Brumbach denying childhood symptoms of inattention/hyperactivity. Responses across mood-related questionnaires were also appropriate and not suggestive of significant psychiatric distress at the present time.  Recommendations: A repeat neuropsychological evaluation in 24 months (or sooner if functional decline is noted) is recommended to assess the trajectory of future cognitive decline should it occur. This will also aid in future efforts towards improved diagnostic clarity.  Mr. Eichorn is encouraged to attend to lifestyle factors for brain health (e.g., regular physical exercise, good nutrition habits, regular participation in cognitively-stimulating activities, and general stress management techniques), which are likely to have benefits for both emotional adjustment and cognition.   There are several exercise-based programs designed specifically for individuals with Parkinson's disease which Mr. Frith may wish to inquire further about. In fact, in addition to promoting good general health, regular exercise incorporating aerobic activities has been demonstrated to be a very effective treatment for depression and stress, with similar efficacy rates to both antidepressant medication and psychotherapy. Some examples include: biking (including utilization of a stationary bike), running, Tai chi, yoga, pilates, dance, weight training, and non-contact boxing.  If interested, there are some activities which have therapeutic value and can be useful in keeping him cognitively stimulated. For suggestions, Mr.  Ferraris is encouraged to go to the following website: https://www.[facility].org/get-to-know-barrow/centers-programs/neurorehabilitation-center/neuro-rehab-apps-and-games/ which has options, categorized by level of difficulty. It should be noted that these activities should not be viewed as a substitute for therapy.  Memory can be improved using internal strategies such as rehearsal, repetition, chunking, mnemonics, association, and imagery. External strategies such as written notes in a consistently used memory journal, visual and nonverbal auditory cues such as  a calendar on the refrigerator or appointments with alarm, such as on a cell phone, can also help maximize recall.    To address problems with processing speed and attention, he may wish to consider:   -Ensuring that he is alerted when essential material or instructions are being presented   -Adjusting the speed at which new information is presented   -Allowing for more time in comprehending, processing, and responding in conversation   -Focusing on one task or conversation at a time (i.e., no multi-tasking)  Review of Records:   Mr. Baiz was seen by [facility] Wells Guiles Tat, D.O.) on 12/09/2019 for an evaluation of possible Parkinson's disease due to gait abnormalities, lip tremor, and positive family history. Regarding specific symptoms, subtle lip and right hand tremors while drinking out of a cup were reported. He has a history of vocal cord atrophy and voice therapy was recommended (and completed). Occasional instances of him acting out his dreams while asleep was reported. Micrographia and symptoms of bradykinesia were also reported; however a shuffling gait was denied. Hallucinations were additionally denied. Cognitive changes were acknowledged in the form of mild word finding deficits. Trouble performing ADLs was denied. Ultimately, Dr. Carles Collet expressed her suspicion that these symptoms represented idiopathic Parkinson's  disease. Mr. Steyer was referred for a comprehensive neuropsychological evaluation to characterize cognitive dysfunction and to assist with diagnostic clarity and treatment planning.  Brain MRI on 01/02/2020 revealed mild to moderate small vessel ischemia and age-appropriate mild atrophy without lobar predominance.   Past Medical History:  Diagnosis Date  . 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 Allergic rhinitis   . Dysphonia 08/27/2019  . Gastroesophageal reflux disease 03/26/2019  . History of malignant carcinoid tumor of large intestine   . History of malignant carcinoid tumor of small intestine   . Insomnia   . Knee osteoarthritis   . 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  . Male erectile dysfunction, unspecified   . Parkinson's disease 12/09/2019  . Rosacea, unspecified   . 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 Vocal fold atrophy     Past Surgical History:  Procedure Laterality Date  . APPENDECTOMY    . COLONOSCOPY  2004/2009/2015  . INGUINAL HERNIA REPAIR Right   . KNEE SURGERY    . PARTIAL COLECTOMY      Current Outpatient Medications:  .  acyclovir (ZOVIRAX) 200 MG capsule, Take 200 mg by mouth 4 (four) times daily as needed., Disp: , Rfl:  .  doxycycline (ORACEA) 40 MG capsule, Take 40 mg by mouth every morning. As needed, Disp: , Rfl:  .  omeprazole (PRILOSEC) 20 MG capsule, Take 20 mg by mouth every other day. , Disp: , Rfl:  .  traZODone (DESYREL) 50 MG tablet, Take 25 mg by mouth at bedtime. As needed, Disp: , Rfl:   Clinical Interview:   Cognitive Symptoms: Decreased short-term memory: Denied. Specifically, Mr. Delsol acknowledged occasional instances where he will forget someone's name or forgets why he entered a room, but expressed his belief that these were age-appropriate. His wife reported greater (but still mild) concerns surrounding short-term memory. However, she also acknowledge potential contributions of attentional dysfunction and hearing loss.  Decreased  long-term memory: Denied. Decreased attention/concentration: Denied. However, his wife reported a long history of "ADD symptoms" surrounding trouble with sustained attention and maintaining his focus. He denied a history of ADHD concerns during childhood or instances where these difficulties caused academic trouble.  Reduced processing speed: Denied. Difficulties with executive functions: Denied. Personality changes were also denied.  Difficulties with emotion regulation: Denied. Difficulties with receptive language: Denied assuming that he can hear the source of the sound adequately.  Difficulties with word finding: Endorsed. Occasional difficulties were acknowledged with words generally come to him with time.  Decreased visuoperceptual ability: Denied.  Trajectory of deficits: Mr. Cowie largely denied concerning cognitive decline or dysfunction. While his wife reported greater concerns, these were at least partially attributed to longstanding weaknesses surrounding attention/concentration and hearing loss.   Difficulties completing ADLs: Denied.  Additional Medical History: History of traumatic brain injury/concussion: Denied. History of stroke: Denied. History of seizure activity: Denied. History of known exposure to toxins: Denied. Symptoms of chronic pain: Largely denied. He did acknowledge feeling "creakier" than he used to feel, as well as some acute right shoulder pain.  Experience of frequent headaches/migraines: Denied. Frequent instances of dizziness/vertigo: He reported a history of vertigo, with severe episodes dating back to his 92s. However, he reported not having a severe episode in decades and his most recent mild episode occurred a few months prior.   Sensory changes: He wears glasses with positive effect. He experienced hearing loss during childhood (potentially due to contracting measles) and wears hearing aids with positive effect. He also lost his sense of smell  approximately 15-20 years ago; this was attributed to side effects of using Zicam nasal spray. Taste difficulties were denied.  Balance/coordination difficulties: He described his balance as "not what is used to be." his wife reported a slowed, somewhat abnormal gait, but denied shuffling. Mr. Polansky denied a history of falls, but did acknowledge that his movements have slowed down and become more deliberate.  Other motor difficulties: He reported a mild lip tremor which ultimately led to his diagnosis of Parkinson's disease. He additionally acknowledged very subtle and occasional tremors in his hands, with one hand not worse than the other. He is not currently taking any levodopa-related medications.   Sleep History: Estimated hours obtained each night: 8 hours. Difficulties falling asleep: Occasionally. Medical records suggest a history of mild insomnia.  Difficulties staying asleep: Occasionally.  Feels rested and refreshed upon awakening: Endorsed.  History of snoring: Endorsed. History of waking up gasping for air: Denied. Witnessed breath cessation while asleep: Denied.  History of vivid dreaming: Denied. Excessive movement while asleep: Endorsed. His wife reported jerking behaviors in his lower extremities around the time immediately before falling asleep. These behaviors were denied outside of sleep.  Instances of acting out his dreams: Endorsed. His wife reported rare instances where she has been accidentally kicked while asleep due to her husband acting out various dreams.   Psychiatric/Behavioral Health History: Depression: Largely denied. He did acknowledge some frustration surrounding feelings of isolation and social withdrawal due to the ongoing COVID-19 pandemic. He denied prior mental health diagnoses and described his current mood as "pretty good, not great." Current or remote suicidal ideation, intent, or plan was also denied.  Anxiety: Denied. Mania: Denied. Trauma  History: Denied. Visual/auditory hallucinations: Denied. Delusional thoughts: Denied.  Tobacco: Denied. Alcohol: He reported consuming a couple ounces of liquor each night. He denied a history of problematic alcohol abuse or dependence.  Recreational drugs: He reported very rare marijuana consumption, generally surrounding him eating a "gummy" when he and his wife visit their son in [place], [place].  Caffeine: 2 cups of coffee in the morning.   Family History: Problem Relation Age of Onset  . Pulmonary fibrosis Mother   . Heart attack Father   . Alcoholism Father   . Parkinson's disease Father   .  CAD Father   . Lung cancer Sister    This information was confirmed by Mr. Cheramie.  Academic/Vocational History: Highest level of educational attainment: 16 years. 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 He described himself as a good (A/B) student in academic settings and was voted most likely to succeed in high school. Spanish was noted as a relative weakness.  History of developmental delay: Denied. History of grade repetition: Denied. History of class failures: Denied. Enrollment in special education courses: Denied. History of LD/ADHD: Denied.  Employment: Retired. He previously worked in [employer].   Evaluation Results:   Behavioral Observations: Mr. Defina was accompanied by his wife, arrived to his appointment on time, and was appropriately dressed and groomed. He appeared alert and oriented. Observed gait and station were mildly slowed, but he did not exhibit a shuffling gait or frank balance instability and ambulated without external assistance. Gross motor functioning appeared intact upon informal observation and no abnormal movements (e.g., tremors) were noted. His affect was generally relaxed and positive, but did range appropriately given the subject being discussed during the clinical interview or the task at hand  during testing procedures. Spontaneous speech was fluent and word finding difficulties were not observed during the clinical interview or testing procedures. Thought processes were coherent, organized, and normal in content. Insight into his cognitive difficulties appeared appropriate. During testing, sustained attention was appropriate. Task engagement was adequate and he persisted when challenged. One test (WCST) was not attempted due to increasing levels of fatigue as testing progressed. Overall, Mr. Soffer was cooperative with the clinical interview and subsequent testing procedures.   Adequacy of Effort: The validity of neuropsychological testing is limited by the extent to which the individual being tested may be assumed to have exerted adequate effort during testing. Mr. Sheehan expressed his intention to perform to the best of his abilities and exhibited adequate task engagement and persistence. Scores across stand-alone and embedded performance validity measures were within expectation. As such, the results of the current evaluation are believed to be a valid representation of Mr. Moise current cognitive functioning.  Test Results: Mr. Shoultz was fully oriented at the time of the current evaluation.  Intellectual abilities based upon educational and vocational attainment were estimated to be in the average range. Premorbid abilities were estimated to be within the well above average range based upon a single-word reading test.   Processing speed was mildly variable, ranging from the below average to above average normative ranges. Basic attention was above average. More complex attention (e.g., working memory) was also above average. Assessed executive functioning was average to above average.  Assessed receptive language abilities were within normal limits. Likewise, Mr. Catoe did not exhibit any difficulties comprehending task instructions and answered all questions asked of  him appropriately. Assessed expressive language (e.g., verbal fluency and confrontation naming) was average to above average.     Assessed visuospatial/visuoconstructional abilities were within normal limits. An isolated relative weakness (i.e., 25th percentile) was exhibited across a spatial task requiring motor-based manipulation. Other spatial tasks without a motorized component scored well within appropriate normative ranges.    Learning (i.e., encoding) of novel verbal and visual information was average. Spontaneous delayed recall (i.e., retrieval) of previously learned information was average to well above average. Retention rates were 76% across a story learning task, 63% across a list learning task, and 100% across a shape learning task. Performance across recognition tasks was appropriate, suggesting evidence for information consolidation.  Results of emotional screening instruments suggested that recent symptoms of generalized anxiety were in the minimal range, while symptoms of depression were within normal limits. A screening instrument assessing recent sleep quality suggested the presence of minimal sleep dysfunction. Responses across an ADHD questionnaire scored in the "unlikely to have ADHD" range across childhood symptoms of both inattention and hyperactivity/impulsivity. Responses across a Parkinson's disease symptom scale suggested primary areas of difficulty surrounding bodily discomfort.   Tables of Scores:   Note: This summary of test scores accompanies the interpretive report and should not be considered in isolation without reference to the appropriate sections in the text. Descriptors are based on appropriate normative data and may be adjusted based on clinical judgment. The terms "impaired" and "within normal limits (WNL)" are used when a more specific level of functioning cannot be determined.       Effort Testing:   DESCRIPTOR       ACS Word Choice: --- --- Within  Expectation    *Based on 74 y/o norms     HVLT-R Recognition Discrimination Index: --- --- Within Expectation  BVMT-R Retention Percentage: --- --- Within Expectation       Orientation:      Raw Score Percentile   NAB Orientation, Form 1 29/29 --- ---       Intellectual Functioning:           Standard Score Percentile   Test of Premorbid Functioning: 993 71 Well Above Average       Memory:          Wechsler Memory Scale (WMS-IV):                       Raw Score (Scaled Score) Percentile     Logical Memory I 29/53 (9) 37 Average    Logical Memory II 16/39 (9) 37 Average    Logical Memory Recognition 17/23 26-50 Average       Hopkins Verbal Learning Test (HVLT-R), Form 1: Raw Score (T Score) Percentile     Total Trials 1-3 24/36 (52) 58 Average    Delayed Recall 7/12 (45) 31 Average    Recognition Discrimination Index 10 (48) 42 Average      True Positives 11 --- ---      False Positives 1 --- ---        Brief Visuospatial Memory Test (BVMT-R), Form 1: Raw Score (T Score) Percentile     Total Trials 1-3 22/36 (52) 58 Average    Delayed Recall 11/12 (63) 91 Well Above Average    Recognition Discrimination Index 6 >16 Within Normal Limits      Recognition Hits 6/6 >16 Within Normal Limits      False Positive Errors 0 >16 Within Normal Limits        Attention/Executive Function:          Trail Making Test (TMT): Raw Score (T Score) Percentile     Part A 36 secs.,  0 errors (47) 38 Average    Part B 58 secs.,  1 error (60) 84 Above Average        Symbol Digit Modalities Test (SDMT): Raw Score (Z-Score) Percentile     Oral 42 (-0.8) 21 Below Average       NAB Attention Module, Form 1: T Score Percentile     Digits Forward 58 79 Above Average    Digits Backwards 58 79 Above Average       D-KEFS Color-Word Interference Test: Raw Score (  Scaled Score) Percentile     Color Naming 29 secs. (12) 75 Above Average    Word Reading 21 secs. (12) 75 Above Average    Inhibition  53 secs. (13) 84 Above Average      Total Errors 0 errors (13) 84 Above Average    Inhibition/Switching 72 secs. (11) 63 Average      Total Errors 3 errors (10) 50 Average       D-KEFS 20 Questions Test: Scaled Score Percentile     Total Weighted Achievement Score 11 63 Average    Initial Abstraction Score 16 98 Exceptionally High       Language:           Raw Score Percentile   PPVT Screening Instrument: 16/16 --- Within Expectation  Sentence Repetition: 20/22 >99 Exceptionally High       Verbal Fluency Test: Raw Score (T Score) Percentile     Phonemic Fluency (FAS) 45 (52) 58 Average    Animal Fluency 21 (56) 73 Average        NAB Language Module, Form 1: T Score Percentile     Auditory Comprehension 56 73 Average    Naming 31/31 (57) 75 Above Average       Visuospatial/Visuoconstruction:      Raw Score Percentile   Clock Drawing: 10/10 --- Within Normal Limits       NAB Spatial Module, Form 1: T Score Percentile     Visual Discrimination 50 50 Average    Design Construction 43 25 Average        Scaled Score Percentile   WAIS-IV Matrix Reasoning: 14 91 Above Average  WAIS-IV Visual Puzzles: 19 >99 Exceptionally High       Mood and Personality:      Raw Score Percentile   Geriatric Depression Scale: 6 --- Within Normal Limits  Geriatric Anxiety Scale: 10 --- Minimal    Somatic 4 --- Minimal    Cognitive 2 --- Minimal    Affective 4 --- Mild       Additional Questionnaires:          Adult Self-Report Scale (Childhood): Raw Score Percentile     Inattention 12 --- Unlikely to have ADHD    Hyperactive/Impulsive 8 --- Unlikely to have ADHD       PROMIS Sleep Disturbance Questionnaire: 14 --- None to Slight       Parkinson's Disease Questionnaire-39: Raw Score Percentile     Mobility 6 15 Mildly Affected    Activities of Daily Living 4 17 Mildly Affected    Emotional Well-Being 3 13 Mildly Affected    Stigma 0 0 Minimally Affected    Social Support 1 8 Minimally  Affected    Cognitions 2 13 Mildly Affected    Communication 1 8 Minimally Affected    Bodily Discomfort 5 42 Moderately Affected   Informed Consent and Coding/Compliance:   Mr. Altergott was provided with a verbal description of the nature and purpose of the present neuropsychological evaluation. Also reviewed were the foreseeable risks and/or discomforts and benefits of the procedure, limits of confidentiality, and mandatory reporting requirements of this provider. The patient was given the opportunity to ask questions and receive answers about the evaluation. Oral consent to participate was provided by the patient.   This evaluation was conducted by Christia Reading, Ph.D., licensed clinical neuropsychologist. Mr. Boehlke completed a comprehensive clinical interview with Dr. Melvyn Novas, billed as one unit [phone], and 145 minutes of cognitive testing and scoring, billed  as one unit [id number] and four additional units [id number]. Psychometrist Milana Kidney, B.S., assisted Dr. Melvyn Novas with test administration and scoring procedures. As a separate and discrete service, Dr. Melvyn Novas spent a total of 180 minutes in interpretation and report writing billed as one unit [phone] and two units [id number].

## 2020-01-20 ENCOUNTER — Ambulatory Visit (INDEPENDENT_AMBULATORY_CARE_PROVIDER_SITE_OTHER): Payer: PPO | Admitting: Psychology

## 2020-01-20 ENCOUNTER — Other Ambulatory Visit: Payer: Self-pay

## 2020-01-20 ENCOUNTER — Encounter: Payer: Self-pay | Admitting: Psychology

## 2020-01-20 DIAGNOSIS — G20 Parkinson's disease: Secondary | ICD-10-CM

## 2020-01-20 NOTE — Progress Notes (Signed)
   Neuropsychology Feedback Session Tillie Rung. [place] Department of Neurology  Reason for Referral:   Barry Pope a 74 y.o. right-handed Caucasian male referred by Alonza Bogus, D.O.,to characterize hiscurrent cognitive functioning and assist with diagnostic clarity and treatment planning in the context of subjective cognitive dysfunction and a history of Parkinson's disease.  Feedback:   Barry Pope completed a comprehensive neuropsychological evaluation on 01/13/2020. Please refer to that encounter for the full report and recommendations. Briefly, results suggested neuropsychological functioning within normal limits. Subtle performance variability was exhibited across processing speed; however scores were largely within appropriate normative ranges. An isolated relative weakness was exhibited across a task assessing spatial manipulation. However, this task included a motorized component and performance across other non-motorized spatial tasks was particularly strong. As there is no prior testing for comparison purposes, it is possible that scores in the average normative range represent a subtle to mild decline from a previously higher level of functioning and that such decline could be caused by his history of Parkinson's disease. Cognitive deficits in Parkinson's disease commonly focus on processing speed, attention/concentration, executive functioning, and encoding/retrieval aspects of memory. However, scores may also merely represent longstanding strengths and weaknesses, as well as normal intraindividual variability across a lengthy battery of tests.  Barry Pope was accompanied by his wife during the current telephone call. Content of the current session focused on the results of his neuropsychological evaluation. Barry Pope and his wife were given the opportunity to ask questions and their questions were answered. They were encouraged to reach out should  additional questions arise. His report is available on MyChart.      Less than 16 minutes were spent conducting the current feedback session with Barry Pope.

## 2020-01-27 ENCOUNTER — Other Ambulatory Visit: Payer: Self-pay | Admitting: Neurology

## 2020-01-27 ENCOUNTER — Telehealth: Payer: Self-pay | Admitting: Neurology

## 2020-01-27 DIAGNOSIS — G20 Parkinson's disease: Secondary | ICD-10-CM

## 2020-01-27 NOTE — Telephone Encounter (Signed)
Pt wants to get a referral for a PT session please call

## 2020-01-27 NOTE — Telephone Encounter (Signed)
I put in and sent order

## 2020-02-10 DIAGNOSIS — R490 Dysphonia: Secondary | ICD-10-CM | POA: Diagnosis not present

## 2020-02-10 DIAGNOSIS — G20 Parkinson's disease: Secondary | ICD-10-CM | POA: Diagnosis not present

## 2020-02-19 DIAGNOSIS — R490 Dysphonia: Secondary | ICD-10-CM | POA: Diagnosis not present

## 2020-04-08 DIAGNOSIS — G20 Parkinson's disease: Secondary | ICD-10-CM | POA: Diagnosis not present

## 2020-06-11 NOTE — Progress Notes (Signed)
Assessment/Plan:   1.  Parkinsons Disease  -His genetic testing was negative.  -Exercise.  -Understands that Parkinson's slightly increases risk for melanoma and needs regular follow-up with dermatology.  -I would recommend med given falls and after a long discussion he decided to trial levodopa.  R/B/SE were discussed.  The opportunity to ask questions was given and they were answered to the best of my ability.  The patient expressed understanding and willingness to follow the outlined treatment protocols.  -refer to [place] [facility] as [facility] didn't call patient (too busy).  Information given to their exercise program as well  2.  Memory change  -Neurocognitive testing in April, 2021 was completely normal.  3.  Depression  -on sertraline, 50 mg daily.     4.  Urinary frequency  - f/u with PCP   Subjective:   Barry Pope was seen today in follow up for Parkinsons disease.  My previous records were reviewed prior to todays visit as well as outside records available to me.  This is a new diagnosis for the patient, diagnosed last visit.  Wife present and supplements hx.  Pt has had 2 falls.  With one he was at outdoor dining and he was at a table that had criss cross legs underneath and his legs got caught on that and he fell.  With the other he was outside and he tripped on a small branch and fell.  Wife states that balance is not "real good." Pt denies lightheadedness, near syncope.  No hallucinations.  Mood has been good since being placed on sertraline 6 weeks ago - was having trouble adjusting to the dx.  Patient had MRI of the brain done on March 25.  I personally reviewed that.  There was mod WMD, but nothing unexpected.  Patient had neurocognitive testing with Dr. Melvyn Novas on April 5.  This was normal.  He had genetic testing that was negative.  He has been going to speech therapy through [facility] and those records are reviewed.  Is doing ST daily.   He is doing workouts at  home.  Current prescribed movement disorder medications: None   PREVIOUS MEDICATIONS: none to date  ALLERGIES:  No Known Allergies  CURRENT MEDICATIONS:  Outpatient Encounter Medications as of 06/12/2020  Medication Sig  . acyclovir (ZOVIRAX) 200 MG capsule Take 200 mg by mouth 4 (four) times daily as needed.  . doxycycline (ORACEA) 40 MG capsule Take 40 mg by mouth every morning. As needed  . omeprazole (PRILOSEC) 20 MG capsule Take 20 mg by mouth every other day.   . sertraline (ZOLOFT) 50 MG tablet Take 50 mg by mouth daily.  . traZODone (DESYREL) 50 MG tablet Take 25 mg by mouth at bedtime. As needed   No facility-administered encounter medications on file as of 06/12/2020.    Objective:   PHYSICAL EXAMINATION:    VITALS:   Vitals:   06/12/20 1328  BP: 125/75  Pulse: 62  SpO2: 99%  Weight: 156 lb (70.8 kg)  Height: 5\' 9"  (1.753 m)    GEN:  The patient appears stated age and is in NAD. HEENT:  Normocephalic, atraumatic.  The mucous membranes are moist. The superficial temporal arteries are without ropiness or tenderness. CV:  RRR Lungs:  CTAB Neck/HEME:  There are no carotid bruits bilaterally.  Neurological examination:  Orientation: The patient is alert and oriented x3. Cranial nerves: There is good facial symmetry with mild facial hypomimia. The speech is fluent and  clear. Soft palate rises symmetrically and there is no tongue deviation. Hearing is intact to conversational tone. Sensation: Sensation is intact to light touch throughout Motor: Strength is at least antigravity x4.  Movement examination: Tone: There is mild increased tone in the RUE.  Tone elsewhere is normal Abnormal movements: none seen, but can feel a little bit of tremor on the right with distraction Coordination:  There is mild decremation with RAM's, only with finger taps on the R.   Gait and Station: The patient has no difficulty arising out of a deep-seated chair without the use of the  hands. The patient's stride length is good with decreased arm swing on the right.      I have reviewed and interpreted the following labs independently   Lab Results  Component Value Date   TSH 1.66 12/09/2019     Total time spent on today's visit was 30 minutes, including both face-to-face time and nonface-to-face time.  Time included that spent on review of records (prior notes available to me/labs/imaging if pertinent), discussing treatment and goals, answering patient's questions and coordinating care.  Cc:  Mayra Neer, MD

## 2020-06-12 ENCOUNTER — Ambulatory Visit: Payer: PPO | Admitting: Neurology

## 2020-06-12 ENCOUNTER — Encounter: Payer: Self-pay | Admitting: Neurology

## 2020-06-12 ENCOUNTER — Other Ambulatory Visit: Payer: Self-pay

## 2020-06-12 VITALS — BP 125/75 | HR 62 | Ht 69.0 in | Wt 156.0 lb

## 2020-06-12 DIAGNOSIS — G20 Parkinson's disease: Secondary | ICD-10-CM

## 2020-06-12 MED ORDER — CARBIDOPA-LEVODOPA 25-100 MG PO TABS: 1.0000 | ORAL_TABLET | Freq: Three times a day (TID) | ORAL | 1 refills | Status: DC

## 2020-06-12 NOTE — Patient Instructions (Addendum)
Start Carbidopa Levodopa as follows:  Take 1/2 tablet three times daily, at least 30 minutes before meals (approximately 7am/11am/4pm), for one week  Then take 1/2 tablet in the morning, 1/2 tablet in the afternoon, 1 tablet in the evening, at least 30 minutes before meals, for one week  Then take 1/2 tablet in the morning, 1 tablet in the afternoon, 1 tablet in the evening, at least 30 minutes before meals, for one week  Then take 1 tablet three times daily at 7am/11am/4pm, at least 30 minutes before meals   As a reminder, carbidopa/levodopa can be taken at the same time as a carbohydrate, but we like to have you take your pill either 30 minutes before a protein source or 1 hour after as protein can interfere with carbidopa/levodopa absorption.  We have sent a referral to [facility] for therapy.  If you don't hear from them to schedule an appointment, feel free to contact them.  Their contact information is as follows:  [facility] Address: [address], [place], [place] [address] Phone: [phone]   The physicians and staff at [facility] are committed to providing excellent care. You may receive a survey requesting feedback about your experience at our office. We strive to receive "very good" responses to the survey questions. If you feel that your experience would prevent you from giving the office a "very good " response, please contact our office to try to remedy the situation. We may be reached at [phone]. Thank you for taking the time out of your busy day to complete the survey.

## 2020-06-12 NOTE — Addendum Note (Signed)
Addended by: Ulice Brilliant T on: 06/12/2020 03:21 PM   Modules accepted: Orders

## 2020-06-26 ENCOUNTER — Telehealth: Payer: Self-pay

## 2020-06-26 NOTE — Telephone Encounter (Signed)
Referral  Type of referral: Physical Therapy  Provider Office/ Name: [facility]  Phone: [phone]  Fax: [phone]  Address: n/a   Appointment Date and Time: 07/01/2020

## 2020-07-01 DIAGNOSIS — R278 Other lack of coordination: Secondary | ICD-10-CM | POA: Diagnosis not present

## 2020-07-01 DIAGNOSIS — R2681 Unsteadiness on feet: Secondary | ICD-10-CM | POA: Diagnosis not present

## 2020-07-01 DIAGNOSIS — G20 Parkinson's disease: Secondary | ICD-10-CM | POA: Diagnosis not present

## 2020-11-04 DIAGNOSIS — L821 Other seborrheic keratosis: Secondary | ICD-10-CM | POA: Diagnosis not present

## 2020-11-04 DIAGNOSIS — D2262 Melanocytic nevi of left upper limb, including shoulder: Secondary | ICD-10-CM | POA: Diagnosis not present

## 2020-11-04 DIAGNOSIS — D485 Neoplasm of uncertain behavior of skin: Secondary | ICD-10-CM | POA: Diagnosis not present

## 2020-11-04 DIAGNOSIS — D225 Melanocytic nevi of trunk: Secondary | ICD-10-CM | POA: Diagnosis not present

## 2020-11-04 DIAGNOSIS — D2272 Melanocytic nevi of left lower limb, including hip: Secondary | ICD-10-CM | POA: Diagnosis not present

## 2020-11-04 DIAGNOSIS — Z85828 Personal history of other malignant neoplasm of skin: Secondary | ICD-10-CM | POA: Diagnosis not present

## 2020-11-04 DIAGNOSIS — D2261 Melanocytic nevi of right upper limb, including shoulder: Secondary | ICD-10-CM | POA: Diagnosis not present

## 2020-11-23 DIAGNOSIS — Z1211 Encounter for screening for malignant neoplasm of colon: Secondary | ICD-10-CM | POA: Diagnosis not present

## 2020-11-23 DIAGNOSIS — Z0000 Encounter for general adult medical examination without abnormal findings: Secondary | ICD-10-CM | POA: Diagnosis not present

## 2020-11-23 DIAGNOSIS — G20 Parkinson's disease: Secondary | ICD-10-CM | POA: Diagnosis not present

## 2020-11-23 DIAGNOSIS — R0683 Snoring: Secondary | ICD-10-CM | POA: Diagnosis not present

## 2020-11-23 DIAGNOSIS — G4700 Insomnia, unspecified: Secondary | ICD-10-CM | POA: Diagnosis not present

## 2020-11-23 DIAGNOSIS — N529 Male erectile dysfunction, unspecified: Secondary | ICD-10-CM | POA: Diagnosis not present

## 2020-11-23 DIAGNOSIS — L719 Rosacea, unspecified: Secondary | ICD-10-CM | POA: Diagnosis not present

## 2020-11-23 DIAGNOSIS — Z79899 Other long term (current) drug therapy: Secondary | ICD-10-CM | POA: Diagnosis not present

## 2020-11-23 DIAGNOSIS — K219 Gastro-esophageal reflux disease without esophagitis: Secondary | ICD-10-CM | POA: Diagnosis not present

## 2020-11-25 DIAGNOSIS — Z1211 Encounter for screening for malignant neoplasm of colon: Secondary | ICD-10-CM | POA: Diagnosis not present

## 2020-12-01 ENCOUNTER — Other Ambulatory Visit: Payer: Self-pay | Admitting: Neurology

## 2020-12-03 NOTE — Telephone Encounter (Signed)
Rx(s) sent to pharmacy electronically.  

## 2020-12-08 NOTE — Progress Notes (Signed)
Assessment/Plan:   1.  Parkinsons Disease  -His genetic testing was negative.  -continue carbidopa/levodopa 25/100 tid  -We discussed that it used to be thought that levodopa would increase risk of melanoma but now it is believed that Parkinsons itself likely increases risk of melanoma. he is to get regular skin checks.  Follows with Dr. Martinique  2.  Memory change  -Neurocognitive testing in April, 2021 was completely normal.  3.  Depression  -on sertraline, 50 mg daily.     4.  Urinary frequency  - f/u with PCP   5.  RBD  -following with Dr. Maxwell Caul   Subjective:   Barry Pope was seen today in follow up for Parkinsons disease.  My previous records were reviewed prior to todays visit as well as outside records available to me.  This is a new diagnosis for the patient, diagnosed last visit.  No SE with levodopa.  Wife present and supplements hx.  "hes been doing great."  Joined [employer] and working out 4-5 days per week.  Pt denies falls.  Pt denies lightheadedness, near syncope.  No hallucinations.  Mood has been good on sertraline.  Current prescribed movement disorder medications: carbidopa/levodopa 25/100, 7am/11am/4pm   PREVIOUS MEDICATIONS: none to date  ALLERGIES:  No Known Allergies  CURRENT MEDICATIONS:  Outpatient Encounter Medications as of 12/10/2020  Medication Sig  . acyclovir (ZOVIRAX) 200 MG capsule Take 200 mg by mouth 4 (four) times daily as needed.  . carbidopa-levodopa (SINEMET IR) 25-100 MG tablet TAKE 1 TABLET BY MOUTH THREE TIMES A DAY AT 7AM, 11AM, AND 4PM  . doxycycline (ORACEA) 40 MG capsule Take 40 mg by mouth every morning. As needed  . omeprazole (PRILOSEC) 20 MG capsule Take 20 mg by mouth every other day.   . sertraline (ZOLOFT) 50 MG tablet Take 50 mg by mouth daily.  . traZODone (DESYREL) 50 MG tablet Take 25 mg by mouth at bedtime. As needed   No facility-administered encounter medications on file as of 12/10/2020.    Objective:    PHYSICAL EXAMINATION:    VITALS:   Vitals:   12/10/20 1535  BP: 116/66  Pulse: 61  SpO2: 99%  Weight: 167 lb (75.8 kg)  Height: 5\' 9"  (1.753 m)    GEN:  The patient appears stated age and is in NAD. HEENT:  Normocephalic, atraumatic.  The mucous membranes are moist. The superficial temporal arteries are without ropiness or tenderness. CV:  RRR Lungs:  CTAB Neck/HEME:  There are no carotid bruits bilaterally.  Neurological examination:  Orientation: The patient is alert and oriented x3. Cranial nerves: There is good facial symmetry with mild facial hypomimia. The speech is fluent and clear. Soft palate rises symmetrically and there is no tongue deviation. Hearing is intact to conversational tone. Sensation: Sensation is intact to light touch throughout Motor: Strength is at least antigravity x4.  Movement examination: Tone: There is  normal tone in the upper and lower extremities. Abnormal movements:  None Coordination:  There is no decremation with rapid alternating movements, with any form of RAMS, including alternating supination and pronation of the forearm, hand opening and closing, finger taps, heel taps and toe taps. Gait and Station: The patient has no difficulty arising out of a deep-seated chair without the use of the hands. The patient's stride length is good with good arm swing bilaterally.  I have reviewed and interpreted the following labs independently   Lab Results  Component Value Date  TSH 1.66 12/09/2019     Total time spent on today's visit was 20 minutes, including both face-to-face time and nonface-to-face time.  Time included that spent on review of records (prior notes available to me/labs/imaging if pertinent), discussing treatment and goals, answering patient's questions and coordinating care.  Cc:  Mayra Neer, MD

## 2020-12-10 ENCOUNTER — Encounter: Payer: Self-pay | Admitting: Neurology

## 2020-12-10 ENCOUNTER — Ambulatory Visit: Payer: PPO | Admitting: Neurology

## 2020-12-10 ENCOUNTER — Other Ambulatory Visit: Payer: Self-pay

## 2020-12-10 VITALS — BP 116/66 | HR 61 | Ht 69.0 in | Wt 167.0 lb

## 2020-12-10 DIAGNOSIS — G20 Parkinson's disease: Secondary | ICD-10-CM

## 2020-12-10 DIAGNOSIS — R0681 Apnea, not elsewhere classified: Secondary | ICD-10-CM | POA: Diagnosis not present

## 2020-12-10 DIAGNOSIS — G4752 REM sleep behavior disorder: Secondary | ICD-10-CM | POA: Diagnosis not present

## 2020-12-10 NOTE — Patient Instructions (Signed)
The physicians and staff at  Neurology are committed to providing excellent care. You may receive a survey requesting feedback about your experience at our office. We strive to receive "very good" responses to the survey questions. If you feel that your experience would prevent you from giving the office a "very good " response, please contact our office to try to remedy the situation. We may be reached at [phone]. Thank you for taking the time out of your busy day to complete the survey.  

## 2020-12-13 DIAGNOSIS — G4752 REM sleep behavior disorder: Secondary | ICD-10-CM | POA: Diagnosis not present

## 2021-02-26 ENCOUNTER — Other Ambulatory Visit (HOSPITAL_BASED_OUTPATIENT_CLINIC_OR_DEPARTMENT_OTHER): Payer: Self-pay

## 2021-02-26 ENCOUNTER — Ambulatory Visit: Payer: PPO | Attending: Internal Medicine

## 2021-02-26 ENCOUNTER — Other Ambulatory Visit: Payer: Self-pay

## 2021-02-26 DIAGNOSIS — Z23 Encounter for immunization: Secondary | ICD-10-CM

## 2021-02-26 MED ORDER — PFIZER-BIONT COVID-19 VAC-TRIS 30 MCG/0.3ML IM SUSP
INTRAMUSCULAR | 0 refills | Status: DC
  Filled 2021-02-26: qty 0.3, 1d supply, fill #0

## 2021-02-26 NOTE — Progress Notes (Signed)
   Covid-19 Vaccination Clinic  Name:  [name]    MRN: [id number] DOB: 1946/06/06  02/26/2021  Mr. Delamater was observed post Covid-19 immunization for 15 minutes without incident. He was provided with Vaccine Information Sheet and instruction to access the V-Safe system.   Mr. Niemczyk was instructed to call 911 with any severe reactions post vaccine: Marland Kitchen Difficulty breathing  . Swelling of face and throat  . A fast heartbeat  . A bad rash all over body  . Dizziness and weakness   Immunizations Administered    Name Date Dose VIS Date Route   Moderna Covid-19 Booster Vaccine 02/26/2021  2:38 PM 0.25 mL 07/29/2020 Intramuscular   Manufacturer: Moderna   Lot: [id number]   [place]: [id number]

## 2021-03-03 ENCOUNTER — Other Ambulatory Visit: Payer: Self-pay | Admitting: Neurology

## 2021-03-10 ENCOUNTER — Other Ambulatory Visit (HOSPITAL_COMMUNITY): Payer: Self-pay

## 2021-03-10 ENCOUNTER — Other Ambulatory Visit: Payer: Self-pay | Admitting: Neurology

## 2021-03-10 ENCOUNTER — Other Ambulatory Visit (HOSPITAL_BASED_OUTPATIENT_CLINIC_OR_DEPARTMENT_OTHER): Payer: Self-pay

## 2021-03-10 MED ORDER — DOXYCYCLINE HYCLATE 50 MG PO CAPS
50.0000 mg | ORAL_CAPSULE | Freq: Every day | ORAL | 2 refills | Status: DC
  Filled 2021-08-09: qty 90, 90d supply, fill #0

## 2021-03-10 MED ORDER — TRAZODONE HCL 50 MG PO TABS
50.0000 mg | ORAL_TABLET | ORAL | 3 refills | Status: DC
  Filled 2021-06-09: qty 90, 90d supply, fill #0
  Filled 2021-09-03: qty 90, 90d supply, fill #1

## 2021-03-10 MED ORDER — SERTRALINE HCL 50 MG PO TABS
50.0000 mg | ORAL_TABLET | Freq: Every evening | ORAL | 1 refills | Status: DC
  Filled 2021-04-09 – 2021-04-15 (×2): qty 30, 30d supply, fill #0
  Filled 2021-05-06 – 2021-05-11 (×2): qty 30, 30d supply, fill #1

## 2021-03-10 MED ORDER — OMEPRAZOLE 20 MG PO CPDR: 20.0000 mg | DELAYED_RELEASE_CAPSULE | Freq: Every day | ORAL | 0 refills | Status: DC

## 2021-03-19 ENCOUNTER — Other Ambulatory Visit (HOSPITAL_BASED_OUTPATIENT_CLINIC_OR_DEPARTMENT_OTHER): Payer: Self-pay

## 2021-03-19 MED ORDER — ACYCLOVIR 200 MG PO CAPS
ORAL_CAPSULE | ORAL | 2 refills | Status: DC
  Filled 2021-03-19: qty 30, 8d supply, fill #0

## 2021-04-09 ENCOUNTER — Other Ambulatory Visit (HOSPITAL_BASED_OUTPATIENT_CLINIC_OR_DEPARTMENT_OTHER): Payer: Self-pay

## 2021-04-13 ENCOUNTER — Other Ambulatory Visit (HOSPITAL_BASED_OUTPATIENT_CLINIC_OR_DEPARTMENT_OTHER): Payer: Self-pay

## 2021-04-15 ENCOUNTER — Other Ambulatory Visit (HOSPITAL_BASED_OUTPATIENT_CLINIC_OR_DEPARTMENT_OTHER): Payer: Self-pay

## 2021-05-06 ENCOUNTER — Other Ambulatory Visit (HOSPITAL_BASED_OUTPATIENT_CLINIC_OR_DEPARTMENT_OTHER): Payer: Self-pay

## 2021-05-11 ENCOUNTER — Other Ambulatory Visit (HOSPITAL_BASED_OUTPATIENT_CLINIC_OR_DEPARTMENT_OTHER): Payer: Self-pay

## 2021-05-17 DIAGNOSIS — H35372 Puckering of macula, left eye: Secondary | ICD-10-CM | POA: Diagnosis not present

## 2021-05-17 DIAGNOSIS — H2513 Age-related nuclear cataract, bilateral: Secondary | ICD-10-CM | POA: Diagnosis not present

## 2021-05-17 DIAGNOSIS — G20 Parkinson's disease: Secondary | ICD-10-CM | POA: Diagnosis not present

## 2021-05-19 ENCOUNTER — Other Ambulatory Visit (HOSPITAL_BASED_OUTPATIENT_CLINIC_OR_DEPARTMENT_OTHER): Payer: Self-pay

## 2021-05-19 MED ORDER — ZOSTER VAC RECOMB ADJUVANTED 50 MCG/0.5ML IM SUSR
INTRAMUSCULAR | 0 refills | Status: DC
  Filled 2021-05-19: qty 0.5, 1d supply, fill #0

## 2021-06-03 ENCOUNTER — Other Ambulatory Visit: Payer: Self-pay | Admitting: Neurology

## 2021-06-04 ENCOUNTER — Other Ambulatory Visit (HOSPITAL_BASED_OUTPATIENT_CLINIC_OR_DEPARTMENT_OTHER): Payer: Self-pay

## 2021-06-09 ENCOUNTER — Other Ambulatory Visit (HOSPITAL_BASED_OUTPATIENT_CLINIC_OR_DEPARTMENT_OTHER): Payer: Self-pay

## 2021-06-09 DIAGNOSIS — G4752 REM sleep behavior disorder: Secondary | ICD-10-CM | POA: Diagnosis not present

## 2021-06-10 ENCOUNTER — Other Ambulatory Visit (HOSPITAL_BASED_OUTPATIENT_CLINIC_OR_DEPARTMENT_OTHER): Payer: Self-pay

## 2021-06-10 MED ORDER — SERTRALINE HCL 50 MG PO TABS
ORAL_TABLET | ORAL | 3 refills | Status: DC
  Filled 2021-06-10: qty 30, 30d supply, fill #0
  Filled 2021-07-12: qty 30, 30d supply, fill #1
  Filled 2021-08-09: qty 30, 30d supply, fill #2
  Filled 2021-09-03: qty 30, 30d supply, fill #3

## 2021-06-11 ENCOUNTER — Other Ambulatory Visit (HOSPITAL_BASED_OUTPATIENT_CLINIC_OR_DEPARTMENT_OTHER): Payer: Self-pay

## 2021-06-11 NOTE — Progress Notes (Signed)
Assessment/Plan:   1.  Parkinsons Disease  -His genetic testing was negative.  -continue carbidopa/levodopa 25/100 tid  -We discussed that it used to be thought that levodopa would increase risk of melanoma but now it is believed that Parkinsons itself likely increases risk of melanoma. he is to get regular skin checks.  Follows with Dr. Martinique  2.  Memory change  -Neurocognitive testing in April, 2021 was completely normal but wife concerned again.  We discussed possibilities of repeating this, but ultimately they decided to hold on that for now.  -Check B12, TSH.  3.  Depression  -on sertraline, 50 mg daily.     4.   RBD  -following with Dr. Maxwell Caul  -On melatonin, 10 mg nightly.   Subjective:   Barry Pope was seen today in follow up for Parkinsons disease.  My previous records were reviewed prior to todays visit as well as outside records available to me.  Pt states "I think my Parkinsons Disease is gone."   No falls.  Following with Dr. Maxwell Caul for RBD.  No lightheadedness or near syncope.  No hallucinations.  Exercising 4 days per week - doing it at [employer].  Mood is good per patient and wife.  Not as active at night in regards to RBD - taking 10 mg of melatonin - was taking 5 mg but they talked with dr. Maxwell Caul and he told them to increase that to see if they could get rid of all RBD events.  Current prescribed movement disorder medications: carbidopa/levodopa 25/100, 7am/11am/4pm   PREVIOUS MEDICATIONS: none to date  ALLERGIES:  No Known Allergies  CURRENT MEDICATIONS:  Outpatient Encounter Medications as of 06/15/2021  Medication Sig   acyclovir (ZOVIRAX) 200 MG capsule Take 200 mg by mouth 4 (four) times daily as needed.   acyclovir (ZOVIRAX) 200 MG capsule 1 capusle Orally four times a day as needed   carbidopa-levodopa (SINEMET IR) 25-100 MG tablet TAKE 1 TABLET BY MOUTH THREE TIMES A DAY AT 7AM, 11AM, AND 4PM   COVID-19 mRNA Vac-TriS, Pfizer, (PFIZER-BIONT  COVID-19 VAC-TRIS) SUSP injection Inject into the muscle.   doxycycline (ORACEA) 40 MG capsule Take 40 mg by mouth every morning. As needed   doxycycline (VIBRAMYCIN) 50 MG capsule Take 1 capsule (50 mg total) by mouth daily as needed for rosacea.   omeprazole (PRILOSEC) 20 MG capsule Take 1 capsule (20 mg total) by mouth daily.   sertraline (ZOLOFT) 50 MG tablet Take 50 mg by mouth daily.   sertraline (ZOLOFT) 50 MG tablet Take 1 tablet (50 mg total) by mouth every evening.   traZODone (DESYREL) 50 MG tablet Take 1/2 to 1 tablet daily at bedtime as needed.   Zoster Vaccine Adjuvanted [facility]) injection Inject into the muscle.   [DISCONTINUED] omeprazole (PRILOSEC) 20 MG capsule Take 20 mg by mouth every other day.    [DISCONTINUED] traZODone (DESYREL) 50 MG tablet Take 25 mg by mouth at bedtime. As needed   No facility-administered encounter medications on file as of 06/15/2021.    Objective:   PHYSICAL EXAMINATION:    VITALS:   Vitals:   06/15/21 1430  BP: (!) 143/62  Pulse: 81  SpO2: 92%  Weight: 164 lb (74.4 kg)  Height: '5\' 9"'$  (1.753 m)     GEN:  The patient appears stated age and is in NAD. HEENT:  Normocephalic, atraumatic.  The mucous membranes are moist. The superficial temporal arteries are without ropiness or tenderness. CV:  RRR Lungs:  CTAB Neck/HEME:  There are no carotid bruits bilaterally.  Neurological examination:  Orientation: The patient is alert and oriented x3. Cranial nerves: There is good facial symmetry with mild facial hypomimia. The speech is fluent and clear. Soft palate rises symmetrically and there is no tongue deviation. Hearing is intact to conversational tone. Sensation: Sensation is intact to light touch throughout Motor: Strength is at least antigravity x4.  Movement examination: Tone: There is  normal tone in the upper and lower extremities. Abnormal movements:  None Coordination:  There is mild decremation on the L Gait and Station:  The patient has no difficulty arising out of a deep-seated chair without the use of the hands. The patient's stride length is good with good arm swing bilaterally.  I have reviewed and interpreted the following labs independently   Lab Results  Component Value Date   TSH 1.66 12/09/2019   No results found for: VITAMINB12   Total time spent on today's visit was 31 minutes, including both face-to-face time and nonface-to-face time.  Time included that spent on review of records (prior notes available to me/labs/imaging if pertinent), discussing treatment and goals, answering patient's questions and coordinating care.  Cc:  Mayra Neer, MD

## 2021-06-15 ENCOUNTER — Ambulatory Visit: Payer: PPO | Admitting: Neurology

## 2021-06-15 ENCOUNTER — Other Ambulatory Visit (INDEPENDENT_AMBULATORY_CARE_PROVIDER_SITE_OTHER): Payer: PPO

## 2021-06-15 ENCOUNTER — Other Ambulatory Visit: Payer: Self-pay

## 2021-06-15 ENCOUNTER — Encounter: Payer: Self-pay | Admitting: Neurology

## 2021-06-15 VITALS — BP 143/62 | HR 81 | Ht 69.0 in | Wt 164.0 lb

## 2021-06-15 DIAGNOSIS — E538 Deficiency of other specified B group vitamins: Secondary | ICD-10-CM

## 2021-06-15 DIAGNOSIS — R413 Other amnesia: Secondary | ICD-10-CM

## 2021-06-15 DIAGNOSIS — R251 Tremor, unspecified: Secondary | ICD-10-CM | POA: Diagnosis not present

## 2021-06-15 LAB — VITAMIN B12: Vitamin B-12: 216 pg/mL (ref 211–911)

## 2021-06-15 LAB — TSH: TSH: 1.13 u[IU]/mL (ref 0.35–5.50)

## 2021-06-15 NOTE — Patient Instructions (Signed)
Your provider has requested that you have labwork completed today. The lab is located on the Second floor at [address], within the [facility] office. When you get off the elevator, turn right and go in the [facility] [address]; the first brown door on the left.  Tell the ladies behind the desk that you are there for lab work. If you are not called within 15 minutes please check with the front desk.   Once you complete your labs you are free to go. You will receive a call or message via MyChart with your lab results.    

## 2021-07-12 ENCOUNTER — Other Ambulatory Visit (HOSPITAL_BASED_OUTPATIENT_CLINIC_OR_DEPARTMENT_OTHER): Payer: Self-pay

## 2021-07-20 ENCOUNTER — Other Ambulatory Visit (HOSPITAL_BASED_OUTPATIENT_CLINIC_OR_DEPARTMENT_OTHER): Payer: Self-pay

## 2021-07-27 ENCOUNTER — Ambulatory Visit: Payer: PPO

## 2021-07-29 ENCOUNTER — Other Ambulatory Visit (HOSPITAL_BASED_OUTPATIENT_CLINIC_OR_DEPARTMENT_OTHER): Payer: Self-pay

## 2021-07-29 ENCOUNTER — Other Ambulatory Visit: Payer: Self-pay

## 2021-07-29 ENCOUNTER — Ambulatory Visit: Payer: PPO | Attending: Internal Medicine

## 2021-07-29 DIAGNOSIS — Z23 Encounter for immunization: Secondary | ICD-10-CM

## 2021-07-29 MED ORDER — MODERNA COVID-19 BIVAL BOOSTER 50 MCG/0.5ML IM SUSP
INTRAMUSCULAR | 0 refills | Status: DC
  Filled 2021-07-29: qty 0.5, 1d supply, fill #0

## 2021-07-29 NOTE — Progress Notes (Signed)
   Covid-19 Vaccination Clinic  Name:  Barry Pope    MRN: [id number] DOB: 06-28-1946  07/29/2021  [name] was observed post Covid-19 immunization for 15 minutes without incident. He was provided with Vaccine Information Sheet and instruction to access the V-Safe system.   [name] was instructed to call 911 with any severe reactions post vaccine: Difficulty breathing  Swelling of face and throat  A fast heartbeat  A bad rash all over body  Dizziness and weakness   Immunizations Administered     Name Date Dose VIS Date Route   Moderna Covid-19 vaccine Bivalent Booster 07/29/2021  3:14 PM 0.5 mL 05/22/2021 Intramuscular   Manufacturer: Moderna   Lot: [id number]   [place]: [id number]

## 2021-08-09 ENCOUNTER — Other Ambulatory Visit (HOSPITAL_BASED_OUTPATIENT_CLINIC_OR_DEPARTMENT_OTHER): Payer: Self-pay

## 2021-08-09 MED ORDER — INFLUENZA VAC A&B SA ADJ QUAD 0.5 ML IM PRSY
PREFILLED_SYRINGE | INTRAMUSCULAR | 0 refills | Status: DC
  Filled 2021-08-09: qty 0.5, 1d supply, fill #0

## 2021-08-09 MED ORDER — OMEPRAZOLE 20 MG PO CPDR
20.0000 mg | DELAYED_RELEASE_CAPSULE | Freq: Every day | ORAL | 3 refills | Status: DC
  Filled 2021-08-09: qty 90, 90d supply, fill #0
  Filled 2021-11-08: qty 90, 90d supply, fill #1

## 2021-09-03 ENCOUNTER — Other Ambulatory Visit (HOSPITAL_BASED_OUTPATIENT_CLINIC_OR_DEPARTMENT_OTHER): Payer: Self-pay

## 2021-09-07 ENCOUNTER — Other Ambulatory Visit (HOSPITAL_BASED_OUTPATIENT_CLINIC_OR_DEPARTMENT_OTHER): Payer: Self-pay

## 2021-09-07 MED ORDER — ZOSTER VAC RECOMB ADJUVANTED 50 MCG/0.5ML IM SUSR
INTRAMUSCULAR | 0 refills | Status: DC
  Filled 2021-09-07: qty 0.5, 1d supply, fill #0

## 2021-09-09 ENCOUNTER — Other Ambulatory Visit (HOSPITAL_BASED_OUTPATIENT_CLINIC_OR_DEPARTMENT_OTHER): Payer: Self-pay

## 2021-09-14 ENCOUNTER — Other Ambulatory Visit (HOSPITAL_BASED_OUTPATIENT_CLINIC_OR_DEPARTMENT_OTHER): Payer: Self-pay

## 2021-10-07 ENCOUNTER — Other Ambulatory Visit (HOSPITAL_BASED_OUTPATIENT_CLINIC_OR_DEPARTMENT_OTHER): Payer: Self-pay

## 2021-10-08 ENCOUNTER — Other Ambulatory Visit (HOSPITAL_BASED_OUTPATIENT_CLINIC_OR_DEPARTMENT_OTHER): Payer: Self-pay

## 2021-10-08 MED ORDER — SERTRALINE HCL 50 MG PO TABS
ORAL_TABLET | ORAL | 3 refills | Status: DC
  Filled 2021-10-08: qty 30, 30d supply, fill #0
  Filled 2021-11-08: qty 30, 30d supply, fill #1
  Filled 2021-12-10: qty 30, 30d supply, fill #2
  Filled 2022-01-14: qty 30, 30d supply, fill #3

## 2021-10-11 ENCOUNTER — Other Ambulatory Visit (HOSPITAL_BASED_OUTPATIENT_CLINIC_OR_DEPARTMENT_OTHER): Payer: Self-pay

## 2021-10-11 MED ORDER — ZOSTER VAC RECOMB ADJUVANTED 50 MCG/0.5ML IM SUSR
INTRAMUSCULAR | 0 refills | Status: DC
  Filled 2021-10-11: qty 0.5, 1d supply, fill #0

## 2021-10-25 ENCOUNTER — Other Ambulatory Visit (HOSPITAL_BASED_OUTPATIENT_CLINIC_OR_DEPARTMENT_OTHER): Payer: Self-pay

## 2021-11-02 ENCOUNTER — Other Ambulatory Visit (HOSPITAL_BASED_OUTPATIENT_CLINIC_OR_DEPARTMENT_OTHER): Payer: Self-pay

## 2021-11-08 ENCOUNTER — Other Ambulatory Visit (HOSPITAL_BASED_OUTPATIENT_CLINIC_OR_DEPARTMENT_OTHER): Payer: Self-pay

## 2021-11-24 DIAGNOSIS — D2262 Melanocytic nevi of left upper limb, including shoulder: Secondary | ICD-10-CM | POA: Diagnosis not present

## 2021-11-24 DIAGNOSIS — Z85828 Personal history of other malignant neoplasm of skin: Secondary | ICD-10-CM | POA: Diagnosis not present

## 2021-11-24 DIAGNOSIS — D2261 Melanocytic nevi of right upper limb, including shoulder: Secondary | ICD-10-CM | POA: Diagnosis not present

## 2021-11-24 DIAGNOSIS — L821 Other seborrheic keratosis: Secondary | ICD-10-CM | POA: Diagnosis not present

## 2021-11-29 ENCOUNTER — Other Ambulatory Visit (HOSPITAL_BASED_OUTPATIENT_CLINIC_OR_DEPARTMENT_OTHER): Payer: Self-pay

## 2021-12-06 ENCOUNTER — Other Ambulatory Visit (HOSPITAL_BASED_OUTPATIENT_CLINIC_OR_DEPARTMENT_OTHER): Payer: Self-pay

## 2021-12-06 DIAGNOSIS — E538 Deficiency of other specified B group vitamins: Secondary | ICD-10-CM | POA: Diagnosis not present

## 2021-12-06 DIAGNOSIS — Z0000 Encounter for general adult medical examination without abnormal findings: Secondary | ICD-10-CM | POA: Diagnosis not present

## 2021-12-06 DIAGNOSIS — G3184 Mild cognitive impairment, so stated: Secondary | ICD-10-CM | POA: Diagnosis not present

## 2021-12-06 DIAGNOSIS — G20 Parkinson's disease: Secondary | ICD-10-CM | POA: Diagnosis not present

## 2021-12-06 DIAGNOSIS — R350 Frequency of micturition: Secondary | ICD-10-CM | POA: Diagnosis not present

## 2021-12-06 DIAGNOSIS — G4752 REM sleep behavior disorder: Secondary | ICD-10-CM | POA: Diagnosis not present

## 2021-12-06 DIAGNOSIS — Z79899 Other long term (current) drug therapy: Secondary | ICD-10-CM | POA: Diagnosis not present

## 2021-12-06 DIAGNOSIS — Z125 Encounter for screening for malignant neoplasm of prostate: Secondary | ICD-10-CM | POA: Diagnosis not present

## 2021-12-06 MED ORDER — TAMSULOSIN HCL 0.4 MG PO CAPS
ORAL_CAPSULE | ORAL | 6 refills | Status: DC
  Filled 2021-12-06: qty 30, 30d supply, fill #0
  Filled 2022-01-02: qty 30, 30d supply, fill #1
  Filled 2022-02-07: qty 30, 30d supply, fill #2
  Filled 2022-02-28 – 2022-03-02 (×2): qty 30, 30d supply, fill #3
  Filled 2022-04-04: qty 30, 30d supply, fill #4
  Filled 2022-05-01: qty 30, 30d supply, fill #5
  Filled 2022-06-02: qty 30, 30d supply, fill #6

## 2021-12-10 ENCOUNTER — Other Ambulatory Visit (HOSPITAL_BASED_OUTPATIENT_CLINIC_OR_DEPARTMENT_OTHER): Payer: Self-pay

## 2021-12-10 MED ORDER — TRAZODONE HCL 50 MG PO TABS
ORAL_TABLET | ORAL | 3 refills | Status: DC
  Filled 2021-12-10: qty 90, 90d supply, fill #0
  Filled 2022-03-23: qty 90, 90d supply, fill #1
  Filled 2022-06-24: qty 90, 90d supply, fill #2
  Filled 2022-09-24: qty 90, 90d supply, fill #3

## 2021-12-13 ENCOUNTER — Other Ambulatory Visit (HOSPITAL_BASED_OUTPATIENT_CLINIC_OR_DEPARTMENT_OTHER): Payer: Self-pay

## 2021-12-14 ENCOUNTER — Other Ambulatory Visit (HOSPITAL_BASED_OUTPATIENT_CLINIC_OR_DEPARTMENT_OTHER): Payer: Self-pay

## 2021-12-14 NOTE — Progress Notes (Signed)
? ? ?Assessment/Plan:  ? ?1.  Parkinsons Disease ? -His genetic testing was negative. ? -continue carbidopa/levodopa 25/100 tid ? -he has facial dyskinesia.  We discussed amantadine along with r/b/se.  We will start with 100 mg bid with first 2 dosages of levodopa.  They will let me know if causes confusion.   ? -We discussed that it used to be thought that levodopa would increase risk of melanoma but now it is believed that Parkinsons itself likely increases risk of melanoma. he is to get regular skin checks.  He is following regularly with dermatology. ? -We will send a referral to physical therapy.  He is otherwise doing a great job with exercise. ? ?2.  Memory change ? -Neurocognitive testing in April, 2021 was completely normal in April, 2021, but wife expresses continued concerns.  This is part of the reason they need to watch amantadine closely. ? ?3.  Depression ? -on sertraline, 50 mg daily.    ? ?4.   RBD ? -following with Dr. Maxwell Caul ? -On melatonin, 10 mg nightly. ?5.  B12 deficiency ? -pt with evidence of B12 deficiency.  Discussed with the patient that neurologically, would like to see B12 levels greater than 400.  Would recommend oral B12 supplementation, 1000 mcg daily.  He is on this, and they state that Dr. Brigitte Pulse recently gave him injection as well. ? ?6.  RLS ? -will consider adding carbidopa/levodopa 50/200 at bedtime but decided to make a f/u in 6-8 weeks to evaluate that since I do not want to start 2 medications at once. ? ? ? ?Subjective:  ? ?Barry Pope was seen today in follow up for Parkinsons disease.  My previous records were reviewed prior to todays visit as well as outside records available to me. Noting little more tremor.  Little more trouble with fine motor skills. Pt denies falls.  Pt denies lightheadedness, near syncope.  No hallucinations.  Mood has been fair and finding himself more frustrated.  Continues to exercise regularly.  Following with dermatology.  Last visit, B12  deficiency identified.  Oral B12 recommended.  Patient reports that he is doing that.  He notes some RLS.  He notes some pulling in the face ? ?Current prescribed movement disorder medications: ?carbidopa/levodopa 25/100, 7am/11am/4pm ?B12 ? ?PREVIOUS MEDICATIONS: none to date ? ?ALLERGIES:  No Known Allergies ? ?CURRENT MEDICATIONS:  ?Outpatient Encounter Medications as of 12/16/2021  ?Medication Sig  ? acyclovir (ZOVIRAX) 200 MG capsule Take 200 mg by mouth 4 (four) times daily as needed.  ? acyclovir (ZOVIRAX) 200 MG capsule 1 capusle Orally four times a day as needed  ? carbidopa-levodopa (SINEMET IR) 25-100 MG tablet TAKE 1 TABLET BY MOUTH THREE TIMES A DAY AT 7AM, 11AM, AND 4PM  ? COVID-19 mRNA bivalent vaccine, Moderna, (MODERNA COVID-19 BIVAL BOOSTER) 50 MCG/0.5ML injection Inject into the muscle.  ? COVID-19 mRNA Vac-TriS, Pfizer, (PFIZER-BIONT COVID-19 VAC-TRIS) SUSP injection Inject into the muscle.  ? doxycycline (ORACEA) 40 MG capsule Take 40 mg by mouth every morning. As needed  ? doxycycline (VIBRAMYCIN) 50 MG capsule Take 1 capsule (50 mg total) by mouth daily as needed for rosacea.  ? influenza vaccine adjuvanted (FLUAD) 0.5 ML injection Inject into the muscle.  ? omeprazole (PRILOSEC) 20 MG capsule Take 1 capsule (20 mg total) by mouth daily.  ? sertraline (ZOLOFT) 50 MG tablet Take 50 mg by mouth daily.  ? sertraline (ZOLOFT) 50 MG tablet Take 1 tablet (50 mg total) by mouth every  evening.  ? tamsulosin (FLOMAX) 0.4 MG CAPS capsule 1 capsule Orally Once a day in the evening for urinary frequency 30 day(s)  ? traZODone (DESYREL) 50 MG tablet Take 1/2 to 1 tablet daily at bedtime as needed.  ? traZODone (DESYREL) 50 MG tablet TAKE 1/2-1 TABLET BY MOUTH AT BEDTIME AS NEEDED  ? Zoster Vaccine Adjuvanted [facility]) injection Inject into the muscle.  ? Zoster Vaccine Adjuvanted [facility]) injection Inject into the muscle.  ? Zoster Vaccine Adjuvanted [facility]) injection Inject into the muscle.  ? ?No  facility-administered encounter medications on file as of 12/16/2021.  ? ? ?Objective:  ? ?PHYSICAL EXAMINATION:   ? ?VITALS:   ?Vitals:  ? 12/16/21 1438  ?BP: 134/83  ?Pulse: 79  ?SpO2: 98%  ?Weight: 166 lb (75.3 kg)  ?Height: '5\' 9"'$  (1.753 m)  ? ? ? ? ?GEN:  The patient appears stated age and is in NAD. ?HEENT:  Normocephalic, atraumatic.  The mucous membranes are moist. The superficial temporal arteries are without ropiness or tenderness. ?CV:  RRR ?Lungs:  CTAB ?Neck/HEME:  There are no carotid bruits bilaterally. ? ?Neurological examination: ? ?Orientation: The patient is alert and oriented x3. ?Cranial nerves: There is good facial symmetry with mild facial hypomimia. The speech is fluent and clear. Soft palate rises symmetrically and there is no tongue deviation. Hearing is intact to conversational tone. ?Sensation: Sensation is intact to light touch throughout ?Motor: Strength is at least antigravity x4. ? ?Movement examination: ?Tone: There is  normal tone in the upper and lower extremities. ?Abnormal movements:  there is facial dyskinesia ?Coordination:  There is min decremation on the R ?Gait and Station: The patient has no difficulty arising out of a deep-seated chair without the use of the hands. The patient's stride length is good with purposeful arm swing bilaterally.  He has some start hesitation ? ?I have reviewed and interpreted the following labs independently ? ? ?Lab Results  ?Component Value Date  ? TSH 1.13 06/15/2021  ? ?Lab Results  ?Component Value Date  ? QIONGEXB28 216 06/15/2021  ? ? ? ?Total time spent on today's visit was 32 minutes, including both face-to-face time and nonface-to-face time.  Time included that spent on review of records (prior notes available to me/labs/imaging if pertinent), discussing treatment and goals, answering patient's questions and coordinating care. ? ?Cc:  Mayra Neer, MD ?

## 2021-12-16 ENCOUNTER — Encounter (HOSPITAL_BASED_OUTPATIENT_CLINIC_OR_DEPARTMENT_OTHER): Payer: Self-pay

## 2021-12-16 ENCOUNTER — Encounter: Payer: Self-pay | Admitting: Neurology

## 2021-12-16 ENCOUNTER — Other Ambulatory Visit (HOSPITAL_BASED_OUTPATIENT_CLINIC_OR_DEPARTMENT_OTHER): Payer: Self-pay

## 2021-12-16 ENCOUNTER — Other Ambulatory Visit: Payer: Self-pay

## 2021-12-16 ENCOUNTER — Ambulatory Visit: Payer: Medicare Other | Admitting: Neurology

## 2021-12-16 VITALS — BP 134/83 | HR 79 | Ht 69.0 in | Wt 166.0 lb

## 2021-12-16 DIAGNOSIS — G249 Dystonia, unspecified: Secondary | ICD-10-CM | POA: Diagnosis not present

## 2021-12-16 DIAGNOSIS — G20 Parkinson's disease: Secondary | ICD-10-CM

## 2021-12-16 MED ORDER — CARBIDOPA-LEVODOPA 25-100 MG PO TABS
1.0000 | ORAL_TABLET | Freq: Three times a day (TID) | ORAL | 1 refills | Status: DC
  Filled 2021-12-16: qty 270, 90d supply, fill #0
  Filled 2022-03-16: qty 270, 90d supply, fill #1

## 2021-12-16 MED ORDER — AMANTADINE HCL 100 MG PO CAPS
100.0000 mg | ORAL_CAPSULE | Freq: Two times a day (BID) | ORAL | 1 refills | Status: DC
  Filled 2021-12-16: qty 180, 90d supply, fill #0

## 2021-12-16 NOTE — Patient Instructions (Addendum)
Take amantadine 100 mg with first 2 dosages of carbidopa/levodopa 25/100  Local and Online Resources for Power over Parkinson's Group March 2023  LOCAL Junior PARKINSON'S GROUPS  Power over Parkinson's Group :   Power Over Parkinson's Patient Education Group will be Wednesday, March 8th-*Hybrid meting*- in person at [place] location and via [facility] at 2:00 pm.   Upcoming Power over [employer] Meetings:  2nd Wednesdays of the month at 2 pm:  March 8th, April 12th Contact Amy Marriott at [email or web address] if interested in participating in this group Parkinson's Care Partners Group:    3rd Mondays, Contact Misty Paladino Atypical Parkinsonian Patient Group:   4th Wednesdays, [place] If you are interested in participating in these groups with Misty, please contact her directly for how to join those meetings.  Her contact information is [email or web address].    LOCAL EVENTS AND NEW OFFERINGS Parkinson's Wellness Event:  Pottery Night at [facility] [place].  Friday, March 10th 5:30-7:30 pm.  Sponsored by [employer] [place].  FREE event for people with Parkinson's and care partners.  RSVP to [facility] at [email or web address] Dine out at [facility].  Celebrate Parkinson's disease Awareness Month and Support the Parkinson's Movement Disorder Fund.   Wednesday, April 19th 4-6 pm at [place], [employer].  (Give receipt to cashier and 20% will be donated) Parkinson's T-shirts for sale!  Designed by a local group member, with funds going to [place].  $20.00  [place] to purchase (see email above) New PWR! Moves Class offering at [employer]!  Fridays 1-2 pm in March (likely to switch days and times after March).  Come try it out and see if PWR! Moves is a good fit for your exercise routine!  Contact Amy Marriott for details:  [email or web address] [place] Challenge Bike, Run, Walk for PD, PSP,  MSA.    Saturday, April 8th at 8 am.  [place]  Proceeds go to offset costs of exercise programs locally.  To Register, visit website:  [email or web address]  Parks:  www.[email or web address].org PD Health at Home continues:  Mindfulness Mondays, Expert Briefing Tuesdays, Wellness Wednesdays, Take Time Thursdays, Fitness Fridays  Upcoming Education:  Parkinson's and Medications:  [employer].  Wednesday, March 8th at 1:00 pm. Freezing and Fall Prevention in Parkinson's.  Wednesday, April 12th at 1:00 pm Register for expert briefings Cytogeneticist) at WatchCalls.si [place] Chapter Parkinson's Symposium: Cognition Changes- a free in-person ([place], [place]) and online [employer]) event for people with Parkinson's and their loved ones. During this event we will explore new treatments and practical strategies for addressing these changes.  Saturday, April 1st, 10 am-1 pm.  Register at [employer].[email or web address]/ or contact [place] at [phone] or [email or web address].  Please check out their website to sign up for emails and see their full online offerings  [place]:  [email or web address]  Third Thursday Webinars:  On the third Thursday of every month at 12 p.m. ET, join our free live webinars to learn about various aspects of living with Parkinson's disease and our work to speed medical breakthroughs. Upcoming Webinar:  The Power of Women in Philanth[employer].  Tues, March 28th at  12 pm. Check out additional information on their website to see their full online offerings  [employer]:  [email or web address] Upcoming Webinar:   Stay tuned Webinar Series:  Living with Parkinson's Meetup.   Third Thursdays of each month at 3 pm Care Partner Monthly Meetup.  With Robin Searing  [place].  First Tuesday of each month, 2  pm Check out additional information to Live Well Today on their website  Parkinson and Movement Disorders (PMD) Alliance:  www.pmdalliance.org NeuroLife Online:  Online Education Events Sign up for emails, which are sent weekly to give you updates on programming and online offerings  Parkinson's Association of the [place]:  www.parkinsonassociation.org Information on online support groups, education events, and online exercises including Yoga, Parkinson's exercises and more-LOTS of information on links to PD resources and online events Virtual Support Group through Parkinson's Association of the [place]; next one is scheduled for Wednesday, *April 5th at 2 pm. *March meeting has been cancelled. (These are typically scheduled for the 1st Wednesday of the month at 2 pm).  Visit website for details. MOVEMENT AND EXERCISE OPPORTUNITIES Parkinson's DRUMMING Classes/Music Therapy with Doylene Canning:  This is a returning class and it's FREE!  2nd Mondays, continuing March 13th , 11:00 at the [place].  Contact *Misty Taylor-Paladino at [email or web address] or Doylene Canning at [phone] or [email or web address]  PWR! Moves Classes at [place].  Wednesdays 10 and 11 am.   NEW PWR! Moves Class offering at [employer].  Fridays 1-2 pm.  Contact Amy Marriott, PT [email or web address] if interested. Here is a link to the PWR!Moves classes on Zoom from [place] - Daily Mon-Sat at 10:00. Via Zoom, FREE and open to all.  There is also a link below via Facebook if you use that platform. AptDealers.si [email or web address]  Parkinson's Wellness Recovery (PWR! Moves)  www.[email or web address] Info on the PWR! Virtual  Experience:  You will have access to our expertise through self-assessment, guided plans that start with the PD-specific fundamentals, educational content, tips, Q&A with an expert, and a growing Art therapist of PD-specific pre-recorded and live exercise classes of varying types and intensity - both physical and cognitive! If that is not enough, we offer 1:1 wellness consultations (in-person or virtual) to personalize your PWR! Research scientist (medical).  [place] Fridays:  As part of the PD Health @ Home program, this free video series focuses each week on one aspect of fitness designed to support people living with Parkinson's.  These weekly videos highlight the [place] recent fitness guidelines for people with Parkinson's disease. ModemGamers.si Dance for PD website is offering free, live-stream classes throughout the week, as well as links to [employer] of classes:  [email or web address]://[email or web address].[email or web address]/ Dance for Parkinson's in-person class.  February 1-April 26, Wednesdays 4-5 pm.  Free class for people with Parkinson's disease, at [address] [place], [place].  Contact [phone] or [email or web address] to register Virtual dance and Pilates for Parkinson's classes: Click on the Community Tab> Parkinson's Movement Initiative Tab.  To register for classes and for more information, visit www.[email or web address] and click the community tab.  [employer] Parkinson's Cycling Classes  [employer]:  Thursdays @ Noon-Live classes at [employer] ([employer] at [email or web address] or [phone]) [facility]: Virtual Classes Mondays and Thursdays Jeanette Caprice classes Tuesday, Wednesday and Thursday (contact [place] at [email or web address]  or [phone]) [place] Varied levels of classes are offered Mondays, Tuesdays and Thursdays at [employer].  Stretching  with Verdis Frederickson weekly class is also offered for people with Parkinson's To observe a class or for more information, call [phone] or email Hezzie Bump at [email or web address] .[email or web address] ADDITIONAL SUPPORT AND RESOURCES Well-Spring Solutions:Online Caregiver Education Opportunities:  www.well-springsolutions.org/caregiver-education/caregiver-support-group.  You may also contact Vickki Muff at [email or web address] or [phone].    Well-Spring Navigator:  Just1Navigator program, a free service to help individuals and families through the journey of determining care for older adults.  The Navigator is a Education officer, museum, Arnell Asal, who will speak with a prospective client and/or loved ones to provide an assessment of the situation and a set of recommendations for a personalized care plan -- all free of charge, and whether Well-Spring Solutions offers the needed service or not. If the need is not a service we provide, we are well-connected with reputable programs in town that we can refer you to.  www.well-springsolutions.org or to speak with the Navigator, call [phone]

## 2021-12-17 ENCOUNTER — Other Ambulatory Visit (HOSPITAL_BASED_OUTPATIENT_CLINIC_OR_DEPARTMENT_OTHER): Payer: Self-pay

## 2021-12-21 ENCOUNTER — Other Ambulatory Visit: Payer: Self-pay

## 2021-12-21 ENCOUNTER — Ambulatory Visit: Payer: Medicare Other | Attending: Neurology | Admitting: Physical Therapy

## 2021-12-21 DIAGNOSIS — R293 Abnormal posture: Secondary | ICD-10-CM | POA: Insufficient documentation

## 2021-12-21 DIAGNOSIS — R2689 Other abnormalities of gait and mobility: Secondary | ICD-10-CM | POA: Diagnosis not present

## 2021-12-21 DIAGNOSIS — G20 Parkinson's disease: Secondary | ICD-10-CM | POA: Insufficient documentation

## 2021-12-22 NOTE — Therapy (Signed)
[facility] ?[address], [address] ?[place], [place], [address] ?Phone: [phone]   Fax:  [phone] ? ?Physical Therapy Evaluation ? ?Patient Details  ?Name: Barry Pope ?MRN: [id number] ?Date of Birth: [birth year] ?Referring Provider (PT): Alonza Bogus, DO ? ? ?Encounter Date: 12/21/2021 ? ? PT End of Session - 12/21/21 1405   ? ? Visit Number 1   ? Number of Visits 5   ? Date for PT Re-Evaluation 01/21/22   ? Authorization Type BCBS Medicare   ? PT Start Time 1405   ? PT Stop Time 1455   ? PT Time Calculation (min) 50 min   ? Activity Tolerance Patient tolerated treatment well   ? Behavior During Therapy [facility] for tasks assessed/performed   ? ?  ?  ? ?  ? ? ?Past Medical History:  ?Diagnosis Date  ? 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 Allergic rhinitis   ? Dysphonia 08/27/2019  ? Gastroesophageal reflux disease 03/26/2019  ? History of malignant carcinoid tumor of large intestine   ? History of malignant carcinoid tumor of small intestine   ? Insomnia   ? Knee osteoarthritis   ? 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 Male erectile dysfunction, unspecified   ? 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 Rosacea, unspecified   ? 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 Vocal fold atrophy   ? ? ?Past Surgical History:  ?Procedure Laterality Date  ? APPENDECTOMY    ? COLONOSCOPY  2004/2009/2015  ? INGUINAL HERNIA REPAIR Right   ? KNEE SURGERY    ? PARTIAL COLECTOMY    ? ? ?There were no vitals filed for this visit. ? ? ? Subjective Assessment - 12/21/21 1410   ? ? Subjective Have had PD for about 3 years.  Go regularly to [place] for exercise and some classes.  Posture is terrible and have a hard time dressing.  (R shoulder internal rotation difficult).  Get fatigued in low back at times with repetitive standing activities.  No falls in past 6 months.  Saw Dr. Carles Collet recently and she wanted me to come to therapy.   ? Patient Stated Goals Pt's goals for therapy are to "cure my Parkinson's"   ?  Currently in Pain? No/denies   ? ?  ?  ? ?  ? ? ? ? ? [facility] PT Assessment - 12/21/21 1413   ? ?  ? Assessment  ? Medical Diagnosis Parkinson's disease   ? Referring Provider (PT) Alonza Bogus, DO   ? Onset Date/Surgical Date 12/16/21   ? Hand Dominance Right   ?  ? Balance Screen  ? Has the patient fallen in the past 6 months No   ? Has the patient had a decrease in activity level because of a fear of falling?  No   ? Is the patient reluctant to leave their home because of a fear of falling?  No   ?  ? Home Environment  ? Living Environment Private residence   ? Living Arrangements Spouse/significant other   ? Available Help at Discharge Family   ? Type of Home House   ? Home Access Stairs to enter   ? Entrance Stairs-Number of Steps 2-3   ? Entrance Stairs-Rails Right   ? Home Layout Two level;Able to live on main level with bedroom/bathroom   Bonus room upstairs-goes up 10-15 x/day  ? Alternate Level Stairs-Number of Steps 16   ? Alternate Level Stairs-Rails Left   ?  ? Prior  Function  ? Level of Independence Independent   ? IT trainer work   ? Leisure Enjoys gardening; Goes to [employer] 4x/wk, does [employer] and classes   ?  ? Observation/Other Assessments  ? Focus on Therapeutic Outcomes (FOTO)  NA   ?  ? Posture/Postural Control  ? Posture/Postural Control Postural limitations   ? Postural Limitations Forward head;Rounded Shoulders   ?  ? ROM / Strength  ? AROM / PROM / Strength AROM;Strength   ?  ? AROM  ? Overall AROM  Deficits   ? Overall AROM Comments decreased full R active knee extension   ?  ? Strength  ? Overall Strength Within functional limits for tasks performed   ?  ? Transfers  ? Transfers Sit to Stand;Stand to Sit   ? Sit to Stand 6: Modified independent (Device/Increase time);Without upper extremity assist;From chair/3-in-1   ? Five time sit to stand comments  9.12   ? Stand to Sit 6: Modified independent (Device/Increase time);Without upper extremity assist;To chair/3-in-1   ?  ?  Ambulation/Gait  ? Ambulation/Gait Yes   ? Ambulation/Gait Assistance 7: Independent   ? Assistive device None   ? Gait Pattern Step-through pattern;Decreased arm swing - right;Decreased trunk rotation   ? Ambulation Surface Level;Indoor   ? Gait velocity 8.88 sec =3.69 ft/sec   ?  ? Standardized Balance Assessment  ? Standardized Balance Assessment Timed Up and Go Test;Mini-BESTest   ?  ? Mini-BESTest  ? Sit To Stand Normal: Comes to stand without use of hands and stabilizes independently.   ? Rise to Toes Normal: Stable for 3 s with maximum height.   ? Stand on one leg (left) Normal: 20 s.   ? Stand on one leg (right) Normal: 20 s.   ? Stand on one leg - lowest score 2   ? Compensatory Stepping Correction - Forward Normal: Recovers independently with a single, large step (second realignement is allowed).   ? Compensatory Stepping Correction - Backward Normal: Recovers independently with a single, large step   ? Compensatory Stepping Correction - Left Lateral Normal: Recovers independently with 1 step (crossover or lateral OK)   ? Compensatory Stepping Correction - Right Lateral Normal: Recovers independently with 1 step (crossover or lateral OK)   ? Stepping Corredtion Lateral - lowest score 2   ? Stance - Feet together, eyes open, firm surface  Normal: 30s   ? Stance - Feet together, eyes closed, foam surface  Normal: 30s   ? Incline - Eyes Closed Normal: Stands independently 30s and aligns with gravity   ? Change in Gait Speed Normal: Significantly changes walkling speed without imbalance   ? Walk with head turns - Horizontal Normal: performs head turns with no change in gait speed and good balance   ? Walk with pivot turns Normal: Turns with feet close FAST (< 3 steps) with good balance.   ? Step over obstacles Normal: Able to step over box with minimal change of gait speed and with good balance.   ? Timed UP & GO with Dual Task Normal: No noticeable change in sitting, standing or walking while backward  counting when compared to TUG without   ? Mini-BEST total score 28   ?  ? Timed Up and Go Test  ? Normal TUG (seconds) 10.03   ? Manual TUG (seconds) 10.15   ? Cognitive TUG (seconds) 10.06   ? ?  ?  ? ?  ? ? ? ? ? ? ? ? ? ? ? ? ? ?  Objective measurements completed on examination: See above findings.  ? ? ? ? ? ? ? ? ? ? ? ? ? ? PT Education - 12/22/21 1313   ? ? Education Details Seated PWR! Moves addition performed (x 5 reps each) and added to HEP; discussed POC and PWR! Moves community exercise classes   ? Person(s) Educated Patient   ? Methods Explanation;Demonstration;Handout   ? Comprehension Verbalized understanding   ? ?  ?  ? ?  ? ? ? ? ? ? PT Long Term Goals - 12/22/21 1427   ? ?  ? PT LONG TERM GOAL #1  ? Title Pt will be independent with Parkinson's specific HEP for improved strength, balance, transfers, and gait.  TARGET 01/21/2022   ? Time 5   ? Period Weeks   ? Status New   ?  ? PT LONG TERM GOAL #2  ? Title Pt will verbalize understanding of local, Parkinson's specific exercise and community fitness options.   ? Time 5   ? Period Weeks   ? Status New   ? ?  ?  ? ?  ? ? ? ? ? ? ? ? ? Plan - 12/22/21 1421   ? ? Clinical Impression Statement Pt is a 76 year old male who presents to OPPT with history of Parkinson's disease.  He is active in community fitness and would like to inquire about community fitness (PWR! Moves) classes.  He presents to OPPT with abnormal posture, decreased timing and coordination of gait.  He demonstrates good balance and fucntional strength and gait measures, but pt may benefit from skilled PT to initiate PD-specific HEP and to address the above stated deficits for improved overall functional mobility and continued independence.   ? Personal Factors and Comorbidities Comorbidity 3+   ? Comorbidities See problem list in snapshot   ? Examination-Activity Limitations Locomotion Level   ? Examination-Participation Restrictions Community Activity   ? Stability/Clinical Decision Making  Stable/Uncomplicated   ? Clinical Decision Making Low   ? Rehab Potential Good   ? PT Frequency 1x / week   ? PT Duration 4 weeks   plus eval  ? PT Treatment/Interventions ADLs/Self Care Home Management;Gait training;Stair tr

## 2021-12-22 NOTE — Addendum Note (Signed)
Addended by: Frazier Butt on: 12/22/2021 02:34 PM ? ? Modules accepted: Orders ? ?

## 2022-01-03 ENCOUNTER — Other Ambulatory Visit (HOSPITAL_BASED_OUTPATIENT_CLINIC_OR_DEPARTMENT_OTHER): Payer: Self-pay

## 2022-01-12 ENCOUNTER — Ambulatory Visit: Payer: Medicare Other | Attending: Neurology | Admitting: Physical Therapy

## 2022-01-12 DIAGNOSIS — R293 Abnormal posture: Secondary | ICD-10-CM | POA: Diagnosis not present

## 2022-01-12 DIAGNOSIS — R2689 Other abnormalities of gait and mobility: Secondary | ICD-10-CM | POA: Insufficient documentation

## 2022-01-12 NOTE — Therapy (Signed)
?[facility] ?[address], [address] ?[place], [place], [address] ?Phone: [phone]   Fax:  [phone] ? ?Physical Therapy Treatment ? ?Patient Details  ?Name: Barry Pope ?MRN: [id number] ?Date of Birth: 12/25/1945 ?Referring Provider (PT): Alonza Bogus, DO ? ? ?Encounter Date: 01/12/2022 ? ? PT End of Session - 01/12/22 1518   ? ? Visit Number 2   ? Number of Visits 5   ? Date for PT Re-Evaluation 01/21/22   ? Authorization Type BCBS Medicare   ? PT Start Time 0848   ? PT Stop Time 0930   ? PT Time Calculation (min) 42 min   ? Activity Tolerance Patient tolerated treatment well   ? Behavior During Therapy [facility] for tasks assessed/performed   ? ?  ?  ? ?  ? ? ?Past Medical History:  ?Diagnosis Date  ? 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 Allergic rhinitis   ? Dysphonia 08/27/2019  ? Gastroesophageal reflux disease 03/26/2019  ? History of malignant carcinoid tumor of large intestine   ? History of malignant carcinoid tumor of small intestine   ? Insomnia   ? Knee osteoarthritis   ? 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 Male erectile dysfunction, unspecified   ? 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 Rosacea, unspecified   ? 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 Vocal fold atrophy   ? ? ?Past Surgical History:  ?Procedure Laterality Date  ? APPENDECTOMY    ? COLONOSCOPY  2004/2009/2015  ? INGUINAL HERNIA REPAIR Right   ? KNEE SURGERY    ? PARTIAL COLECTOMY    ? ? ?There were no vitals filed for this visit. ? ? Subjective Assessment - 01/12/22 0852   ? ? Subjective Tried the exercises once at home.  Can't find a good chair for home.   ? Patient Stated Goals Pt's goals for therapy are to "cure my Parkinson's"   ? Currently in Pain? Yes   ? Pain Score 2    ? Pain Location Knee   ? Pain Orientation Right   ? Pain Descriptors / Indicators Aching   ? Pain Type Chronic pain   ? Pain Onset More than a month ago   ? Pain Frequency Intermittent   ? Aggravating Factors  walking   ? Pain Relieving  Factors lying down/sitting   ? ?  ?  ? ?  ? ? ? ? ? ? ? ? ? ? ? ? ? ? ? ? ? ? ? ? ? ? ? PWR [facility]) - 01/12/22 0900   ? ? PWR! exercises Moves in sitting;Moves in standing;Moves in [place];Moves in supine;Moves in prone   ? PWR! Up x 5   ? PWR! Rock x 5   ? PWR! Twist x 5   ? PWR! Step x 5   ? Comments-SUPINE Cues for technique and amplitude   ? PWR! Up x 10   ? PWR! Rock x 10   ? PWR! Twist x 10   ? PWR! Step x 10   ? Comments-QUADRUPED Cues for technique (verbal and tactile)   ? PWR! Up x 5   ? PWR! Rock x 5   ? PWR! Twist x 5   ? PWR! Step x 5   ? Comments-PRONE Cues for technique (verbal and tactile)   ? PWR! Up x 10   ? PWR! Rock x 10   ? PWR! Twist x 10   ? PWR Step x 10   ?  Comments-STANDING  cues for technique and amplitude   ? PWR! Up x 10   ? PWR! Rock x 10   ? PWR! Twist x 10   ? PWR! Step x 10   ? Comments-SITTING Review of HEP from last visit, min cues for technique   ? ?  ?  ? ?  ? ? ? ? ? ? ? PT Education - 01/12/22 1517   ? ? Education Details Provided handout for PWR! Moves [employer] class   ? Person(s) Educated Patient   ? Methods Explanation;Handout   ? Comprehension Verbalized understanding   ? ?  ?  ? ?  ? ? ? ? ? ? PT Long Term Goals - 12/22/21 1427   ? ?  ? PT LONG TERM GOAL #1  ? Title Pt will be independent with Parkinson's specific HEP for improved strength, balance, transfers, and gait.  TARGET 01/21/2022   ? Time 5   ? Period Weeks   ? Status New   ?  ? PT LONG TERM GOAL #2  ? Title Pt will verbalize understanding of local, Parkinson's specific exercise and community fitness options.   ? Time 5   ? Period Weeks   ? Status New   ? ?  ?  ? ?  ? ? ? ? ? ? ? ? Plan - 01/12/22 1519   ? ? Clinical Impression Statement Pt returns to first visit for PT today after eval.  Reviewed seated PWR! Moves today, with pt needing min cues for technique and amplitude of movement.  Worked through all other PWR! Moves positions-standing, quadruped, prone, and supine, to address posture, weightshifting, trunk  rotation, and stepping, and to prepare patient for transition to community fitness class.  Did not yet add to HEP, as pt may need practice prior to given additional 1-2 sets of PWR! Moves for home.   ? Personal Factors and Comorbidities Comorbidity 3+   ? Comorbidities See problem list in snapshot   ? Examination-Activity Limitations Locomotion Level   ? Examination-Participation Restrictions Community Activity   ? Stability/Clinical Decision Making Stable/Uncomplicated   ? Rehab Potential Good   ? PT Frequency 1x / week   ? PT Duration 4 weeks   plus eval  ? PT Treatment/Interventions ADLs/Self Care Home Management;Gait training;Stair training;Functional mobility training;Therapeutic activities;Therapeutic exercise;Balance training;Neuromuscular re-education;Manual techniques;Patient/family education;Passive range of motion   ? PT Next Visit Plan Review PWR! Moves standing and give for HEP; gait with reciprocal arm swing; ask about PWR! Moves classes; if pt continues beyond next visit, may need to recert   ? Consulted and Agree with Plan of Care Patient   ? ?  ?  ? ?  ? ? ?Patient will benefit from skilled therapeutic intervention in order to improve the following deficits and impairments:  Abnormal gait, Decreased mobility, Postural dysfunction, Impaired flexibility ? ?Visit Diagnosis: ?Other abnormalities of gait and mobility ? ?Abnormal posture ? ? ? ? ?Problem List ?Patient Active Problem List  ? Diagnosis Date Noted  ? 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 Allergies   ? Dysphonia 08/27/2019  ? 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 Gastroesophageal reflux disease 03/26/2019  ? 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 ? ?Frazier Butt., PT ?01/12/2022, 3:24 PM ? ?[place] ?[facility] ?[address], [address] ?[place], [place], [address] ?Phone: [phone]   Fax:  [phone] ? ?Name: Barry Pope ?MRN: [id number] ?Date of Birth: 08-27-1946 ? ? ? ?

## 2022-01-14 ENCOUNTER — Other Ambulatory Visit (HOSPITAL_BASED_OUTPATIENT_CLINIC_OR_DEPARTMENT_OTHER): Payer: Self-pay

## 2022-01-19 ENCOUNTER — Ambulatory Visit: Payer: Medicare Other | Admitting: Physical Therapy

## 2022-01-19 ENCOUNTER — Encounter: Payer: Self-pay | Admitting: Physical Therapy

## 2022-01-19 DIAGNOSIS — R293 Abnormal posture: Secondary | ICD-10-CM

## 2022-01-19 DIAGNOSIS — R2689 Other abnormalities of gait and mobility: Secondary | ICD-10-CM

## 2022-01-19 NOTE — Therapy (Signed)
?[facility] ?[address], [address] ?[place], [place], [address] ?Phone: [phone]   Fax:  [phone] ? ?Physical Therapy Treatment ? ?Patient Details  ?Name: Barry Pope ?MRN: [id number] ?Date of Birth: November 23, 1945 ?Referring Provider (PT): Alonza Bogus, DO ? ? ?Encounter Date: 01/19/2022 ? ? PT End of Session - 01/19/22 0851   ? ? Visit Number 3   ? Number of Visits 5   ? Date for PT Re-Evaluation 01/21/22   ? Authorization Type BCBS Medicare   ? PT Start Time [phone]   ? PT Stop Time 2585   ? PT Time Calculation (min) 38 min   ? Activity Tolerance Patient tolerated treatment well   ? Behavior During Therapy [facility] for tasks assessed/performed   ? ?  ?  ? ?  ? ? ?Past Medical History:  ?Diagnosis Date  ? 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 Allergic rhinitis   ? Dysphonia 08/27/2019  ? Gastroesophageal reflux disease 03/26/2019  ? History of malignant carcinoid tumor of large intestine   ? History of malignant carcinoid tumor of small intestine   ? Insomnia   ? Knee osteoarthritis   ? 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 Male erectile dysfunction, unspecified   ? 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 Rosacea, unspecified   ? 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 Vocal fold atrophy   ? ? ?Past Surgical History:  ?Procedure Laterality Date  ? APPENDECTOMY    ? COLONOSCOPY  2004/2009/2015  ? INGUINAL HERNIA REPAIR Right   ? KNEE SURGERY    ? PARTIAL COLECTOMY    ? ? ?There were no vitals filed for this visit. ? ? Subjective Assessment - 01/19/22 0850   ? ? Subjective No real changes; have a sleepless period in the evenings sometimes due to restless leg syndrome.   ? Patient Stated Goals Pt's goals for therapy are to "cure my Parkinson's"   ? Currently in Pain? No/denies   ? Pain Onset More than a month ago   ? ?  ?  ? ?  ? ? ? ? ? ? ? ? ? ? ? ? ? ? ? ? ? ? ? ? [place] Adult PT Treatment/Exercise - 01/19/22 0001   ? ?  ? Ambulation/Gait  ? Ambulation/Gait Yes   ? Ambulation/Gait Assistance 7:  Independent   ? Ambulation Distance (Feet) 350 Feet   240, 180 with resistance at hips  ? Assistive device None   ? Gait Pattern Step-through pattern;Decreased arm swing - right;Decreased trunk rotation   ? Ambulation Surface Level;Indoor   ? Gait Comments Used walking poles, and blue theraband resistance at hips, to facilitate improved posture, step lenght and reciprocal arm swing.   ? ?  ?  ? ?  ? ? ? ? PWR [facility]) - 01/19/22 0856   ? ? PWR! Up x 20   ? PWR! Rock x 20   ? PWR! Twist x 20   ? PWR Step x 20   ? Comments PWR! Moves in standing, visual, and tactile cues for technique and intensity.  Pt rates effort level as 5-6/10 with PWR! Moves standing.   ? ?  ?  ? ?  ? ? ? Balance Exercises - 01/19/22 0001   ? ?  ? Balance Exercises: Standing  ? Stepping Strategy Anterior;Posterior;10 reps;Limitations   ? Stepping Strategy Limitations Extra cues and time for posterior step and weightshift technique.   ? Other Standing Exercises Forward/back step and weigthshift x  10 reps.   ? ?  ?  ? ?  ? ? ? ? ? PT Education - 01/19/22 0926   ? ? Education Details Additions to HEP for PWR! Moves Standing-discussed walking program for home, about 10 minutes-attention and focus on posture, step length; intensity of movement 6-8/10   ? Person(s) Educated Patient   ? Methods Explanation;Demonstration;Handout   ? Comprehension Verbalized understanding;Returned demonstration;Need further instruction   ? ?  ?  ? ?  ? ? ? ? ? ? PT Long Term Goals - 12/22/21 1427   ? ?  ? PT LONG TERM GOAL #1  ? Title Pt will be independent with Parkinson's specific HEP for improved strength, balance, transfers, and gait.  TARGET 01/21/2022   ? Time 5   ? Period Weeks   ? Status New   ?  ? PT LONG TERM GOAL #2  ? Title Pt will verbalize understanding of local, Parkinson's specific exercise and community fitness options.   ? Time 5   ? Period Weeks   ? Status New   ? ?  ?  ? ?  ? ? ? ? ? ? ? ? Plan - 01/19/22 0930   ? ? Clinical Impression Statement  Continued skilled PT session today focused on large amplitude movement patterns, and added standing PWR! Moves to HEP.  Also focused on coordination with reciprocal arm and leg motions, with pt responding well to cues with resistance at hips wiht upright posture.  He continues to get off-sequence with reciprocal arm swing and he will benefit from continued skilled PT to address high level balance, coordination, gait activities.   ? PT Frequency 1x / week   ? PT Duration 4 weeks   ? PT Treatment/Interventions ADLs/Self Care Home Management;Gait training;Stair training;Functional mobility training;Therapeutic activities;Therapeutic exercise;Balance training;Neuromuscular re-education;Manual techniques;Patient/family education;Passive range of motion   ? PT Next Visit Plan Review PWR! Moves standing given for HEP; gait with reciprocal arm swing; trunk rotation, arm swing/rocking exercises to work on engaging trunk; will need to recert to cover next 2 visits   ? Consulted and Agree with Plan of Care Patient   ? ?  ?  ? ?  ? ? ?Patient will benefit from skilled therapeutic intervention in order to improve the following deficits and impairments:  Abnormal gait, Decreased mobility, Postural dysfunction, Impaired flexibility ? ?Visit Diagnosis: ?Other abnormalities of gait and mobility ? ?Abnormal posture ? ? ? ? ?Problem List ?Patient Active Problem List  ? Diagnosis Date Noted  ? 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 Allergies   ? Dysphonia 08/27/2019  ? 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 Gastroesophageal reflux disease 03/26/2019  ? 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 ? ?Savina Olshefski W., PT ?01/19/2022, 9:34 AM ? ?[place] ?[facility] ?[address], [address] ?[place], [place], [address] ?Phone: [phone]   Fax:  [phone] ? ?Name: CLEARANCE CHENAULT ?MRN: [id number] ?Date of Birth: May 18, 1946 ? ? ? ?

## 2022-01-26 ENCOUNTER — Encounter: Payer: Self-pay | Admitting: Physical Therapy

## 2022-01-26 ENCOUNTER — Ambulatory Visit: Payer: Medicare Other | Admitting: Physical Therapy

## 2022-01-26 DIAGNOSIS — J811 Chronic pulmonary edema: Secondary | ICD-10-CM | POA: Diagnosis not present

## 2022-01-26 DIAGNOSIS — G9341 Metabolic encephalopathy: Secondary | ICD-10-CM | POA: Diagnosis not present

## 2022-01-26 DIAGNOSIS — G20 Parkinson's disease: Secondary | ICD-10-CM | POA: Diagnosis not present

## 2022-01-26 DIAGNOSIS — R9431 Abnormal electrocardiogram [ECG] [EKG]: Secondary | ICD-10-CM | POA: Diagnosis not present

## 2022-01-26 DIAGNOSIS — K5730 Diverticulosis of large intestine without perforation or abscess without bleeding: Secondary | ICD-10-CM | POA: Diagnosis not present

## 2022-01-26 DIAGNOSIS — R451 Restlessness and agitation: Secondary | ICD-10-CM | POA: Diagnosis not present

## 2022-01-26 DIAGNOSIS — E871 Hypo-osmolality and hyponatremia: Secondary | ICD-10-CM | POA: Diagnosis not present

## 2022-01-26 DIAGNOSIS — R29898 Other symptoms and signs involving the musculoskeletal system: Secondary | ICD-10-CM | POA: Diagnosis not present

## 2022-01-26 DIAGNOSIS — R9389 Abnormal findings on diagnostic imaging of other specified body structures: Secondary | ICD-10-CM | POA: Diagnosis not present

## 2022-01-26 DIAGNOSIS — Z79899 Other long term (current) drug therapy: Secondary | ICD-10-CM | POA: Diagnosis not present

## 2022-01-26 DIAGNOSIS — R945 Abnormal results of liver function studies: Secondary | ICD-10-CM | POA: Diagnosis not present

## 2022-01-26 DIAGNOSIS — R410 Disorientation, unspecified: Secondary | ICD-10-CM | POA: Diagnosis not present

## 2022-01-26 DIAGNOSIS — A419 Sepsis, unspecified organism: Secondary | ICD-10-CM | POA: Diagnosis not present

## 2022-01-26 DIAGNOSIS — R2689 Other abnormalities of gait and mobility: Secondary | ICD-10-CM

## 2022-01-26 DIAGNOSIS — Z781 Physical restraint status: Secondary | ICD-10-CM | POA: Diagnosis not present

## 2022-01-26 DIAGNOSIS — Z9049 Acquired absence of other specified parts of digestive tract: Secondary | ICD-10-CM | POA: Diagnosis not present

## 2022-01-26 DIAGNOSIS — Z85060 Personal history of malignant carcinoid tumor of small intestine: Secondary | ICD-10-CM | POA: Diagnosis not present

## 2022-01-26 DIAGNOSIS — R7401 Elevation of levels of liver transaminase levels: Secondary | ICD-10-CM | POA: Diagnosis not present

## 2022-01-26 DIAGNOSIS — R4789 Other speech disturbances: Secondary | ICD-10-CM | POA: Diagnosis not present

## 2022-01-26 DIAGNOSIS — M1710 Unilateral primary osteoarthritis, unspecified knee: Secondary | ICD-10-CM | POA: Diagnosis not present

## 2022-01-26 DIAGNOSIS — G319 Degenerative disease of nervous system, unspecified: Secondary | ICD-10-CM | POA: Diagnosis not present

## 2022-01-26 DIAGNOSIS — R42 Dizziness and giddiness: Secondary | ICD-10-CM | POA: Diagnosis not present

## 2022-01-26 DIAGNOSIS — Z85030 Personal history of malignant carcinoid tumor of large intestine: Secondary | ICD-10-CM | POA: Diagnosis not present

## 2022-01-26 DIAGNOSIS — Z20822 Contact with and (suspected) exposure to covid-19: Secondary | ICD-10-CM | POA: Diagnosis not present

## 2022-01-26 DIAGNOSIS — R293 Abnormal posture: Secondary | ICD-10-CM

## 2022-01-26 DIAGNOSIS — R109 Unspecified abdominal pain: Secondary | ICD-10-CM | POA: Diagnosis not present

## 2022-01-26 DIAGNOSIS — E861 Hypovolemia: Secondary | ICD-10-CM | POA: Diagnosis not present

## 2022-01-26 DIAGNOSIS — Z85038 Personal history of other malignant neoplasm of large intestine: Secondary | ICD-10-CM | POA: Diagnosis not present

## 2022-01-26 DIAGNOSIS — G9389 Other specified disorders of brain: Secondary | ICD-10-CM | POA: Diagnosis not present

## 2022-01-26 DIAGNOSIS — N400 Enlarged prostate without lower urinary tract symptoms: Secondary | ICD-10-CM | POA: Diagnosis not present

## 2022-01-26 DIAGNOSIS — N281 Cyst of kidney, acquired: Secondary | ICD-10-CM | POA: Diagnosis not present

## 2022-01-26 DIAGNOSIS — K219 Gastro-esophageal reflux disease without esophagitis: Secondary | ICD-10-CM | POA: Diagnosis not present

## 2022-01-26 DIAGNOSIS — Z820 Family history of epilepsy and other diseases of the nervous system: Secondary | ICD-10-CM | POA: Diagnosis not present

## 2022-01-26 DIAGNOSIS — R748 Abnormal levels of other serum enzymes: Secondary | ICD-10-CM | POA: Diagnosis not present

## 2022-01-26 DIAGNOSIS — F05 Delirium due to known physiological condition: Secondary | ICD-10-CM | POA: Diagnosis not present

## 2022-01-26 DIAGNOSIS — G3184 Mild cognitive impairment, so stated: Secondary | ICD-10-CM | POA: Diagnosis not present

## 2022-01-26 DIAGNOSIS — R509 Fever, unspecified: Secondary | ICD-10-CM | POA: Diagnosis not present

## 2022-01-26 DIAGNOSIS — K760 Fatty (change of) liver, not elsewhere classified: Secondary | ICD-10-CM | POA: Diagnosis not present

## 2022-01-26 DIAGNOSIS — G9340 Encephalopathy, unspecified: Secondary | ICD-10-CM | POA: Diagnosis not present

## 2022-01-26 NOTE — Patient Instructions (Addendum)
? ? ?  Sit to Stand Transfers: ? ?Scoot out to the edge of the chair ?Place your feet flat on the floor, shoulder width apart.  Make sure your feet are tucked just under your knees. ?Lean forward (nose over toes) with momentum, and stand up tall with your best posture.  If you need to use your arms, use them as a quick boost up to stand. ?If you are in a low or soft chair, you can lean back and then forward up to stand, in order to get more momentum. ?Once you are standing, make sure you are looking ahead and standing tall. ? ?To sit down: ? ?Back up until you feel the chair behind your legs. ?Bend at your hips, reaching  Back for you chair, if needed, then slowly squat to sit down on your chair.  ? ? ? ?Access Code: [email or web address] ?URL: [email or web address] ?Date: 01/26/2022 ?Prepared by: [facility] ? ?Exercises ?- Hooklying Single Knee to Chest Stretch  - 1 x daily - 7 x weekly - 1 sets - 3 reps - 30 sec hold ?- Supine Lower Trunk Rotation  - 1 x daily - 7 x weekly - 1 sets - 3-5 reps - 15-30 sec hold ?- Supine Anterior Pelvic Tilt  - 1 x daily - 7 x weekly - 1-2 sets - 10 reps ?- Supine Posterior Pelvic Tilt  - 1 x daily - 7 x weekly - 1 sets - 10 reps ?- Standing Lumbar Spine Flexion Stretch Counter  - 1 x daily - 7 x weekly - 1 sets - 5 reps ? ?

## 2022-01-27 ENCOUNTER — Inpatient Hospital Stay (HOSPITAL_COMMUNITY)
Admission: EM | Admit: 2022-01-27 | Discharge: 2022-01-30 | DRG: 640 | Disposition: A | Discharge status: Home Health | Payer: Medicare Other | Source: Ambulatory Visit | Attending: Internal Medicine | Admitting: Internal Medicine

## 2022-01-27 ENCOUNTER — Other Ambulatory Visit (HOSPITAL_BASED_OUTPATIENT_CLINIC_OR_DEPARTMENT_OTHER): Payer: Self-pay

## 2022-01-27 ENCOUNTER — Telehealth: Payer: Self-pay | Admitting: Neurology

## 2022-01-27 ENCOUNTER — Emergency Department (HOSPITAL_COMMUNITY): Payer: Medicare Other

## 2022-01-27 ENCOUNTER — Encounter (HOSPITAL_COMMUNITY): Payer: Self-pay | Admitting: Emergency Medicine

## 2022-01-27 DIAGNOSIS — G20 Parkinson's disease: Secondary | ICD-10-CM | POA: Diagnosis present

## 2022-01-27 DIAGNOSIS — Z20822 Contact with and (suspected) exposure to covid-19: Secondary | ICD-10-CM | POA: Diagnosis present

## 2022-01-27 DIAGNOSIS — R29898 Other symptoms and signs involving the musculoskeletal system: Secondary | ICD-10-CM

## 2022-01-27 DIAGNOSIS — G3184 Mild cognitive impairment, so stated: Secondary | ICD-10-CM | POA: Diagnosis present

## 2022-01-27 DIAGNOSIS — Z820 Family history of epilepsy and other diseases of the nervous system: Secondary | ICD-10-CM

## 2022-01-27 DIAGNOSIS — E871 Hypo-osmolality and hyponatremia: Principal | ICD-10-CM

## 2022-01-27 DIAGNOSIS — R451 Restlessness and agitation: Secondary | ICD-10-CM | POA: Diagnosis not present

## 2022-01-27 DIAGNOSIS — R509 Fever, unspecified: Secondary | ICD-10-CM | POA: Diagnosis present

## 2022-01-27 DIAGNOSIS — R7989 Other specified abnormal findings of blood chemistry: Secondary | ICD-10-CM

## 2022-01-27 DIAGNOSIS — Z79899 Other long term (current) drug therapy: Secondary | ICD-10-CM

## 2022-01-27 DIAGNOSIS — R4789 Other speech disturbances: Secondary | ICD-10-CM | POA: Diagnosis not present

## 2022-01-27 DIAGNOSIS — K219 Gastro-esophageal reflux disease without esophagitis: Secondary | ICD-10-CM | POA: Diagnosis not present

## 2022-01-27 DIAGNOSIS — R9389 Abnormal findings on diagnostic imaging of other specified body structures: Secondary | ICD-10-CM | POA: Diagnosis not present

## 2022-01-27 DIAGNOSIS — G9340 Encephalopathy, unspecified: Secondary | ICD-10-CM | POA: Diagnosis not present

## 2022-01-27 DIAGNOSIS — M6282 Rhabdomyolysis: Secondary | ICD-10-CM

## 2022-01-27 DIAGNOSIS — G9341 Metabolic encephalopathy: Secondary | ICD-10-CM | POA: Diagnosis present

## 2022-01-27 DIAGNOSIS — Z85030 Personal history of malignant carcinoid tumor of large intestine: Secondary | ICD-10-CM

## 2022-01-27 DIAGNOSIS — M1710 Unilateral primary osteoarthritis, unspecified knee: Secondary | ICD-10-CM | POA: Diagnosis present

## 2022-01-27 DIAGNOSIS — K760 Fatty (change of) liver, not elsewhere classified: Secondary | ICD-10-CM | POA: Diagnosis present

## 2022-01-27 DIAGNOSIS — Z781 Physical restraint status: Secondary | ICD-10-CM

## 2022-01-27 DIAGNOSIS — K5730 Diverticulosis of large intestine without perforation or abscess without bleeding: Secondary | ICD-10-CM | POA: Diagnosis present

## 2022-01-27 DIAGNOSIS — E861 Hypovolemia: Secondary | ICD-10-CM | POA: Diagnosis present

## 2022-01-27 DIAGNOSIS — R7401 Elevation of levels of liver transaminase levels: Secondary | ICD-10-CM

## 2022-01-27 DIAGNOSIS — G20A1 Parkinson's disease without dyskinesia, without mention of fluctuations: Secondary | ICD-10-CM | POA: Diagnosis present

## 2022-01-27 DIAGNOSIS — Z85038 Personal history of other malignant neoplasm of large intestine: Secondary | ICD-10-CM

## 2022-01-27 DIAGNOSIS — Z85060 Personal history of malignant carcinoid tumor of small intestine: Secondary | ICD-10-CM | POA: Diagnosis not present

## 2022-01-27 DIAGNOSIS — R410 Disorientation, unspecified: Principal | ICD-10-CM

## 2022-01-27 DIAGNOSIS — F05 Delirium due to known physiological condition: Secondary | ICD-10-CM | POA: Diagnosis not present

## 2022-01-27 DIAGNOSIS — Z9049 Acquired absence of other specified parts of digestive tract: Secondary | ICD-10-CM

## 2022-01-27 DIAGNOSIS — G4700 Insomnia, unspecified: Secondary | ICD-10-CM | POA: Insufficient documentation

## 2022-01-27 DIAGNOSIS — R2689 Other abnormalities of gait and mobility: Secondary | ICD-10-CM | POA: Diagnosis not present

## 2022-01-27 LAB — COMPREHENSIVE METABOLIC PANEL
ALT: 137 U/L — ABNORMAL HIGH (ref 0–44)
AST: 199 U/L — ABNORMAL HIGH (ref 15–41)
Albumin: 3.5 g/dL (ref 3.5–5.0)
Alkaline Phosphatase: 140 U/L — ABNORMAL HIGH (ref 38–126)
Anion gap: 6 (ref 5–15)
BUN: 13 mg/dL (ref 8–23)
CO2: 25 mmol/L (ref 22–32)
Calcium: 8.4 mg/dL — ABNORMAL LOW (ref 8.9–10.3)
Chloride: 98 mmol/L (ref 98–111)
Creatinine, Ser: 1.07 mg/dL (ref 0.61–1.24)
GFR, Estimated: >60 mL/min (ref >60)
Glucose, Bld: 123 mg/dL — ABNORMAL HIGH (ref 70–99)
Potassium: 4.3 mmol/L (ref 3.5–5.1)
Sodium: 129 mmol/L — ABNORMAL LOW (ref 135–145)
Total Bilirubin: 0.8 mg/dL (ref 0.3–1.2)
Total Protein: 6 g/dL — ABNORMAL LOW (ref 6.5–8.1)

## 2022-01-27 LAB — URINALYSIS, ROUTINE W REFLEX MICROSCOPIC
Bilirubin Urine: NEGATIVE
Glucose, UA: NEGATIVE mg/dL
Ketones, ur: NEGATIVE mg/dL
Leukocytes,Ua: NEGATIVE
Nitrite: NEGATIVE
Protein, ur: 30 mg/dL — AB
Specific Gravity, Urine: 1.018 (ref 1.005–1.030)
pH: 7 (ref 5.0–8.0)

## 2022-01-27 LAB — CBC WITH DIFFERENTIAL/PLATELET
Abs Immature Granulocytes: 0.03 10*3/uL (ref 0.00–0.07)
Basophils Absolute: 0 10*3/uL (ref 0.0–0.1)
Basophils Relative: 1 %
Eosinophils Absolute: 0 10*3/uL (ref 0.0–0.5)
Eosinophils Relative: 1 %
HCT: 40.8 % (ref 39.0–52.0)
Hemoglobin: 13.8 g/dL (ref 13.0–17.0)
Immature Granulocytes: 1 %
Lymphocytes Relative: 5 %
Lymphs Abs: 0.2 10*3/uL — ABNORMAL LOW (ref 0.7–4.0)
MCH: 31.9 pg (ref 26.0–34.0)
MCHC: 33.8 g/dL (ref 30.0–36.0)
MCV: 94.4 fL (ref 80.0–100.0)
Monocytes Absolute: 0.6 10*3/uL (ref 0.1–1.0)
Monocytes Relative: 12 %
Neutro Abs: 4.2 10*3/uL (ref 1.7–7.7)
Neutrophils Relative %: 80 %
Platelets: 139 10*3/uL — ABNORMAL LOW (ref 150–400)
RBC: 4.32 MIL/uL (ref 4.22–5.81)
RDW: 10.9 % — ABNORMAL LOW (ref 11.5–15.5)
WBC: 5.1 10*3/uL (ref 4.0–10.5)
nRBC: 0 % (ref 0.0–0.2)

## 2022-01-27 LAB — RESP PANEL BY RT-PCR (FLU A&B, COVID) ARPGX2
Influenza A by PCR: NEGATIVE
Influenza B by PCR: NEGATIVE
SARS Coronavirus 2 by RT PCR: NEGATIVE

## 2022-01-27 LAB — LACTIC ACID, PLASMA: Lactic Acid, Venous: 0.7 mmol/L (ref 0.5–1.9)

## 2022-01-27 LAB — PROTIME-INR
INR: 1 (ref 0.8–1.2)
Prothrombin Time: 12.8 seconds (ref 11.4–15.2)

## 2022-01-27 LAB — APTT: aPTT: 29 seconds (ref 24–36)

## 2022-01-27 MED ORDER — CEFTRIAXONE SODIUM 2 G IJ SOLR
2.0000 g | Freq: Once | INTRAMUSCULAR | Status: AC
  Administered 2022-01-27: 2 g via INTRAVENOUS
  Filled 2022-01-27: qty 20

## 2022-01-27 MED ORDER — POLYETHYLENE GLYCOL 3350 17 G PO PACK: 17.0000 g | PACK | Freq: Every day | ORAL | Status: DC | PRN

## 2022-01-27 MED ORDER — CARBIDOPA-LEVODOPA 25-100 MG PO TABS
1.0000 | ORAL_TABLET | Freq: Three times a day (TID) | ORAL | Status: DC
  Administered 2022-01-28 – 2022-01-30 (×8): 1 via ORAL
  Filled 2022-01-27 (×8): qty 1

## 2022-01-27 MED ORDER — SODIUM CHLORIDE 0.9 % IV SOLN: INTRAVENOUS | Status: DC

## 2022-01-27 MED ORDER — ACETAMINOPHEN 500 MG PO TABS
1000.0000 mg | ORAL_TABLET | Freq: Once | ORAL | Status: AC
  Administered 2022-01-27: 1000 mg via ORAL
  Filled 2022-01-27: qty 2

## 2022-01-27 MED ORDER — SODIUM CHLORIDE 0.9% FLUSH
3.0000 mL | Freq: Two times a day (BID) | INTRAVENOUS | Status: DC
  Administered 2022-01-27 – 2022-01-30 (×4): 3 mL via INTRAVENOUS

## 2022-01-27 MED ORDER — ACETAMINOPHEN 650 MG RE SUPP: 650.0000 mg | Freq: Four times a day (QID) | RECTAL | Status: DC | PRN

## 2022-01-27 MED ORDER — ENOXAPARIN SODIUM 40 MG/0.4ML IJ SOSY
40.0000 mg | PREFILLED_SYRINGE | INTRAMUSCULAR | Status: DC
  Administered 2022-01-28 – 2022-01-30 (×3): 40 mg via SUBCUTANEOUS
  Filled 2022-01-27 (×3): qty 0.4

## 2022-01-27 MED ORDER — ACETAMINOPHEN 325 MG PO TABS
650.0000 mg | ORAL_TABLET | Freq: Four times a day (QID) | ORAL | Status: DC | PRN
Start: 2022-01-27 — End: 2022-01-30

## 2022-01-27 MED ORDER — IOHEXOL 300 MG/ML  SOLN
100.0000 mL | Freq: Once | INTRAMUSCULAR | Status: AC | PRN
  Administered 2022-01-27: 100 mL via INTRAVENOUS

## 2022-01-27 MED ORDER — PANTOPRAZOLE SODIUM 40 MG PO TBEC
40.0000 mg | DELAYED_RELEASE_TABLET | Freq: Every day | ORAL | Status: DC
  Administered 2022-01-28 – 2022-01-30 (×3): 40 mg via ORAL
  Filled 2022-01-27 (×3): qty 1

## 2022-01-27 NOTE — Therapy (Signed)
[place] ?[facility] ?[address], [address] ?[place], [place], [address] ?Phone: [phone]   Fax:  [phone] ? ?Physical Therapy Treatment/REcert ? ? ?Patient Details  ?Name: Barry Pope ?MRN: [id number] ?Date of Birth: 01-30-1946 ?Referring Provider (PT): Alonza Bogus, DO ? ? ?Encounter Date: 01/26/2022 ? ? PT End of Session - 01/27/22 5643   ? ? Visit Number 4   ? Number of Visits 9   ? Date for PT Re-Evaluation 32/95/18   per recert 8/41/6606  ? Authorization Type BCBS Medicare   ? PT Start Time 0848   ? PT Stop Time 3016   ? PT Time Calculation (min) 44 min   ? Activity Tolerance Patient tolerated treatment well   ? Behavior During Therapy [facility] for tasks assessed/performed   ? ?  ?  ? ?  ? ? ?Past Medical History:  ?Diagnosis Date  ? 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 Allergic rhinitis   ? Dysphonia 08/27/2019  ? Gastroesophageal reflux disease 03/26/2019  ? History of malignant carcinoid tumor of large intestine   ? History of malignant carcinoid tumor of small intestine   ? Insomnia   ? Knee osteoarthritis   ? 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 Male erectile dysfunction, unspecified   ? 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 Rosacea, unspecified   ? 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 Vocal fold atrophy   ? ? ?Past Surgical History:  ?Procedure Laterality Date  ? APPENDECTOMY    ? COLONOSCOPY  2004/2009/2015  ? INGUINAL HERNIA REPAIR Right   ? KNEE SURGERY    ? PARTIAL COLECTOMY    ? ? ?There were no vitals filed for this visit. ? ? Subjective Assessment - 01/26/22 0852   ? ? Subjective Sometimes have pain in my back, especially after stooping and bending a lot.   Today I woke up with back pain.   ? Patient Stated Goals Pt's goals for therapy are to "cure my Parkinson's"   ? Currently in Pain? Yes   ? Pain Score 4    ? Pain Location Knee   ? Pain Orientation Right   ? Pain Descriptors / Indicators Aching   ? Pain Type Chronic pain   ? Pain Onset More than a month ago   ? Pain  Frequency Intermittent   ? Aggravating Factors  walking   ? Pain Relieving Factors lying down/sitting   ? Multiple Pain Sites Yes   ? Pain Score --   back pain resolved now, but was 4-5/10 upon waking  ? ?  ?  ? ?  ? ? ? ? ? ? ? ? ? ? ? ? ? ? ? ? ? ? ? ? [place] Adult PT Treatment/Exercise - 01/26/22 0850   ? ?  ? Transfers  ? Transfers Sit to Stand;Stand to Sit   ? Sit to Stand 6: Modified independent (Device/Increase time);Without upper extremity assist;From chair/3-in-1   ? Stand to Sit 6: Modified independent (Device/Increase time);Without upper extremity assist;To chair/3-in-1   ? Number of Reps Other reps (comment);1 set   5 reps  ? Transfer Cueing Cues for technique:  widened BOS, increased forward lean, PWR! Up posture in standing, then rock to prepare to step (simulating mornings when pt states he is off balance.   ?  ? Ambulation/Gait  ? Ambulation/Gait Yes   ? Ambulation/Gait Assistance 7: Independent   ? Ambulation Distance (Feet) 340 Feet   ? Assistive device None   ? Gait Pattern  Step-through pattern;Decreased arm swing - right;Decreased trunk rotation;Trunk flexed   ? Ambulation Surface Level;Indoor   ? Gait Comments Following postural awareness exercises, gait with cues for "tall as the wall" posture with relaxed arm swing, increased step length.  Pt tends to have same side stride/arm swing with gait, but improved posture.   ?  ? Exercises  ? Exercises Lumbar;Other Exercises   ? Other Exercises  STanding at doorframe, "tall as the wall" for posture awareness:  scapular squeezes x 10 reps, then cervical retraction/chin tucks x 10 reps.   ?  ? Lumbar Exercises: Stretches  ? Single Knee to Chest Stretch Right;Left;10 seconds;1 rep   ? Double Knee to Chest Stretch 3 reps;20 seconds   ? Lower Trunk Rotation 3 reps;30 seconds   ? Pelvic Tilt 5 reps;5 seconds   ?  ? Lumbar Exercises: Standing  ? Other Standing Lumbar Exercises Wide BOS anterior/posterior rocking for low back/hamstring/gastroc stretching at  counter, x 10 reps   ? ?  ?  ? ?  ? ? ? ? PWR [facility]) - 01/27/22 0831   ? ? PWR! exercises Moves in standing   ? PWR! Up x 5   for posture, then additional 5 reps for squat technique to address positioning for squatting, stooping for gardening tasks.  ? PWR! Rock x 5   through BLEs only, to simulate rock to get balance prior to initiating gait  ? ?  ?  ? ?  ? ? ? ? ? ? ? PT Education - 01/27/22 0837   ? ? Education Details HEP additions; connection/rationale for PWR! Moves in relation to fucntional activities; importance of daily HEP   ? Person(s) Educated Patient   ? Methods Explanation;Demonstration;Handout   ? Comprehension Verbalized understanding;Returned demonstration   ? ?  ?  ? ?  ? ? ? ? ? ? PT Long Term Goals - 01/27/22 0941   ? ?  ? PT LONG TERM GOAL #1  ? Title Pt will be independent with Parkinson's specific HEP for improved strength, balance, transfers, and gait.  TARGET 01/21/2022>02/25/2022   ? Time 5   ? Period Weeks   ? Status On-going   ?  ? PT LONG TERM GOAL #2  ? Title Pt will verbalize understanding of local, Parkinson's specific exercise and community fitness options.   ? Time 5   ? Period Weeks   ? Status On-going   ? ?  ?  ? ?  ? ? ? ? ? ? ? ? Plan - 01/27/22 0838   ? ? Clinical Impression Statement Recert completed today-goals ongoing, as pt's initial scheduling was delayed.  Pt has received initial HEP and additions provided this visit.  Pt has been attending new PWR! Moves [employer] PWR! Moves classes.  Focus of today's session was focusing on stiffness, postural abnormalities.  He also demonstrates decreased timing and coordination with gait.  He will continue to benefit from skilled PT towards goals for improved funcitonal mobility.   ? PT Frequency 1x / week   ? PT Duration 4 weeks   per recert 1/61/0960  ? PT Treatment/Interventions ADLs/Self Care Home Management;Gait training;Stair training;Functional mobility training;Therapeutic activities;Therapeutic exercise;Balance  training;Neuromuscular re-education;Manual techniques;Patient/family education;Passive range of motion   ? PT Next Visit Plan Review additions to HEP; gait with reciprocal arm swing; trunk rotation, arm swing/rocking exercises to work on engaging trunk; ask about additional visits   ? Consulted and Agree with Plan of Care Patient   ? ?  ?  ? ?  ? ? ?  Patient will benefit from skilled therapeutic intervention in order to improve the following deficits and impairments:  Abnormal gait, Decreased mobility, Postural dysfunction, Impaired flexibility ? ?Visit Diagnosis: ?Other abnormalities of gait and mobility ? ?Abnormal posture ? ? ? ? ?Problem List ?Patient Active Problem List  ? Diagnosis Date Noted  ? 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 Allergies   ? Dysphonia 08/27/2019  ? 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 Gastroesophageal reflux disease 03/26/2019  ? 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 ? ?Jeralyn Nolden W., PT ?01/27/2022, 9:47 AM ? ?[place] ?[facility] ?[address], [address] ?[place], [place], [address] ?Phone: [phone]   Fax:  [phone] ? ?Name: Barry Pope ?MRN: [id number] ?Date of Birth: Sep 07, 1946 ? ? ? ?

## 2022-01-27 NOTE — Telephone Encounter (Signed)
Called patients wife back to get more information. She said patient is not sleeping getting up around 3 AM. Patient is also having confusion and not able to find words. Patient feeling dizzy seeing things and overall not feeling like himself. Patient started taking amantadine last visit and patients wife feels it may be contributing to his confused state. She has kept patient from driving or going to exercise today do to him feeling dizzy and confused  ?

## 2022-01-27 NOTE — H&P (Signed)
?History and Physical  ? ?Barry Pope IOX:[id number] DOB: [birth year] DOA: 01/27/2022 ? ?PCP: Mayra Neer, MD  ? ?Patient coming from: Home ? ?Chief Complaint: Altered mental status ? ?HPI: Barry Pope is a 76 y.o. male with medical history significant of Parkinson disease, mild cognitive impairment, GERD, history of colon cancer, vocal cord disorder presenting with altered mental status. ? ?History obtained with assistance of family and chart review.  Patient has had 2 days of weakness and confusion.  Does have some baseline confusion in the setting of his Parkinson's disease but family reporting this is significantly increased.  Was seen at his neurologist office and was evaluated for possible UTI but this was negative and recommended patient be seen in the ED for further evaluation. ? ?Patient denies fevers, chills, chest pain, shortness of breath, abdominal pain, constipation, diarrhea, nausea, vomiting. ? ?ED Course: Vital signs in the ED significant for fever to 101, blood pressure in the 268T to 419Q systolic.  Lab work-up included CMP which showed sodium 129, glucose 123, calcium 8.4, protein 6.0, AST 199, ALT 137, alk phos 40.  CBC with platelets of 139.  PT, PTT, INR within normal limits.  Lactic acid normal with repeat pending.  Urinalysis with hemoglobin, protein, rare bacteria only.  Urine culture and blood culture pending.  Chest x-ray showed mild cardiomegaly and pulmonary vascular congestion and CT of the head showed no acute abnormality.  CT of the abdomen pelvis showed calcified mass at the mesentery suspicious for treated adenopathy but recommended follow-up imaging.  Patient also noted to be status post colectomy and ileectomy.  Diverticulosis and prominent liver with steatosis also noted.  Renal cyst and prostatomegaly also noted as well.  Patient received ceftriaxone Tylenol, IV fluids in the ED.  GI was consulted and recommend observation and will evaluate in the  morning. ? ?Review of Systems: As per HPI otherwise all other systems reviewed and are negative. ? ?Past Medical History:  ?Diagnosis Date  ? 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 Allergic rhinitis   ? Dysphonia 08/27/2019  ? Gastroesophageal reflux disease 03/26/2019  ? History of malignant carcinoid tumor of large intestine   ? History of malignant carcinoid tumor of small intestine   ? Insomnia   ? Knee osteoarthritis   ? 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 Male erectile dysfunction, unspecified   ? 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 Rosacea, unspecified   ? 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 Vocal fold atrophy   ? ? ?Past Surgical History:  ?Procedure Laterality Date  ? APPENDECTOMY    ? COLONOSCOPY  2004/2009/2015  ? INGUINAL HERNIA REPAIR Right   ? KNEE SURGERY    ? PARTIAL COLECTOMY    ? ? ?Social History ? reports that he has never smoked. He has never used smokeless tobacco. He reports current alcohol use of about 14.0 - 21.0 standard drinks per week. He reports that he does not currently use drugs after having used the following drugs: Marijuana. ? ?No Known Allergies ? ?Family History  ?Problem Relation Age of Onset  ? Pulmonary fibrosis Mother   ? Heart attack Father   ? Alcoholism Father   ? Parkinson's disease Father   ? CAD Father   ? Lung cancer Sister   ?Reviewed on admission ? ?Prior to Admission medications   ?Medication Sig Start Date End Date Taking? Authorizing Provider  ?acyclovir (ZOVIRAX) 200 MG capsule Take 200 mg by mouth 4 (four) times daily as needed. ?Patient not taking: Reported on  12/21/2021    [provider]  ?acyclovir (ZOVIRAX) 200 MG capsule 1 capusle Orally four times a day as needed 03/19/21     ?amantadine (SYMMETREL) 100 MG capsule Take 1 capsule (100 mg total) by mouth 2 (two) times daily. Take with first 2 dosages of levodopa 12/16/21   Tat, Eustace Quail, DO  ?carbidopa-levodopa (SINEMET IR) 25-100 MG tablet Take 1 tablet by mouth 3 (three) times daily. 12/16/21   Tat, Eustace Quail, DO   ?doxycycline (ORACEA) 40 MG capsule Take 40 mg by mouth every morning. As needed    [provider]  ?doxycycline (VIBRAMYCIN) 50 MG capsule Take 1 capsule (50 mg total) by mouth daily as needed for rosacea. 01/08/21   Mayra Neer, MD  ?omeprazole (PRILOSEC) 20 MG capsule Take 1 capsule (20 mg total) by mouth daily. 08/09/21     ?sertraline (ZOLOFT) 50 MG tablet Take 50 mg by mouth daily. ?Patient not taking: Reported on 12/21/2021    [provider]  ?sertraline (ZOLOFT) 50 MG tablet Take 1 tablet (50 mg total) by mouth every evening. 10/08/21     ?tamsulosin (FLOMAX) 0.4 MG CAPS capsule 1 capsule Orally Once a day in the evening for urinary frequency 30 day(s) 12/06/21     ?traZODone (DESYREL) 50 MG tablet Take 1/2 to 1 tablet daily at bedtime as needed. 11/25/20   Mayra Neer, MD  ?traZODone (DESYREL) 50 MG tablet TAKE 1/2-1 TABLET BY MOUTH AT BEDTIME AS NEEDED 12/10/21     ? ? ?Physical Exam: ?Vitals:  ? 01/27/22 2045 01/27/22 2111 01/27/22 2210 01/27/22 2300  ?BP: (!) 144/78  (!) 153/81 103/62  ?Pulse: 74 71 82 73  ?Resp: 14 20 (!) 24 20  ?Temp:      ?TempSrc:      ?SpO2: 98% 97% 96% 95%  ? ? ?Physical Exam ?Constitutional:   ?   General: He is not in acute distress. ?   Appearance: Normal appearance.  ?HENT:  ?   Head: Normocephalic and atraumatic.  ?   Mouth/Throat:  ?   Mouth: Mucous membranes are moist.  ?   Pharynx: Oropharynx is clear.  ?Eyes:  ?   Extraocular Movements: Extraocular movements intact.  ?   Pupils: Pupils are equal, round, and reactive to light.  ?Cardiovascular:  ?   Rate and Rhythm: Normal rate and regular rhythm.  ?   Pulses: Normal pulses.  ?   Heart sounds: Normal heart sounds.  ?Pulmonary:  ?   Effort: Pulmonary effort is normal. No respiratory distress.  ?   Breath sounds: Wheezing (RLL) present.  ?Abdominal:  ?   General: Bowel sounds are normal. There is no distension.  ?   Palpations: Abdomen is soft.  ?   Tenderness: There is no abdominal tenderness.   ?Musculoskeletal:     ?   General: No swelling or deformity.  ?Skin: ?   General: Skin is warm and dry.  ?Neurological:  ?   General: No focal deficit present.  ?   Comments: Alert and oriented   ? ?Labs on Admission: I have personally reviewed following labs and imaging studies ? ?CBC: ?Recent Labs  ?Lab 01/27/22 ?1735  ?WBC 5.1  ?NEUTROABS 4.2  ?HGB 13.8  ?HCT 40.8  ?MCV 94.4  ?PLT 139*  ? ? ?Basic Metabolic Panel: ?Recent Labs  ?Lab 01/27/22 ?1735  ?NA 129*  ?K 4.3  ?CL 98  ?CO2 25  ?GLUCOSE 123*  ?BUN 13  ?CREATININE 1.07  ?CALCIUM 8.4*  ? ? ?  GFR: ?CrCl cannot be calculated (Unknown ideal weight.). ? ?Liver Function Tests: ?Recent Labs  ?Lab 01/27/22 ?1735  ?AST 199*  ?ALT 137*  ?ALKPHOS 140*  ?BILITOT 0.8  ?PROT 6.0*  ?ALBUMIN 3.5  ? ? ?Urine analysis: ?   ?Component Value Date/Time  ? [place] YELLOW 01/27/2022 2005  ? APPEARANCEUR CLEAR 01/27/2022 2005  ? LABSPEC 1.018 01/27/2022 2005  ? PHURINE 7.0 01/27/2022 2005  ? [place] NEGATIVE 01/27/2022 2005  ? HGBUR SMALL (A) 01/27/2022 2005  ? [place] NEGATIVE 01/27/2022 2005  ? [place] NEGATIVE 01/27/2022 2005  ? PROTEINUR 30 (A) 01/27/2022 2005  ? NITRITE NEGATIVE 01/27/2022 2005  ? [place] NEGATIVE 01/27/2022 2005  ? ? ?Radiological Exams on Admission: ?CT Head Wo Contrast ? ?Result Date: 01/27/2022 ?CLINICAL DATA:  Increased confusion, dizziness and delirium. Low-grade fever. EXAM: CT HEAD WITHOUT CONTRAST TECHNIQUE: Contiguous axial images were obtained from the base of the skull through the vertex without intravenous contrast. RADIATION DOSE REDUCTION: This exam was performed according to the departmental dose-optimization program which includes automated exposure control, adjustment of the mA and/or kV according to patient size and/or use of iterative reconstruction technique. COMPARISON:  MRI brain 01/02/2020. FINDINGS: Brain: Again noted are mild cerebral atrophy with mild-to-moderate white matter small vessel disease and mild atrophic  ventriculomegaly. Cerebellum and brainstem are unremarkable and normal in volume. No new asymmetry is seen worrisome for acute infarct, hemorrhage or mass. There is no midline shift or basal cisternal crowding. Vascular: There are

## 2022-01-27 NOTE — ED Notes (Signed)
Patient transported to CT 

## 2022-01-27 NOTE — ED Provider Notes (Signed)
?[facility] ?Provider Note ? ? ?CSN: [id number] ?Arrival date & time: 01/27/22  1714 ? ?  ? ?History ? ?Chief Complaint  ?Patient presents with  ? Altered Mental Status  ? ? ?Barry Pope is a 76 y.o. male. ? ?76 yo M with a chief complaint of altered mental status.  Going on for the past couple days.  Has become increasingly weak and had significant confusion.  Has a history of Parkinson's and has some confusion at baseline but wife thinks this is significantly worse.  Was noted to be febrile in triage no noted fevers at home.  No cough or congestion.  He went to his neurologist office today and had a UA with concern for possible urinary tract infection but it was negative and was sent here for evaluation. ? ? ?Altered Mental Status ? ?  ? ?Home Medications ?Prior to Admission medications   ?Medication Sig Start Date End Date Taking? Authorizing Provider  ?acyclovir (ZOVIRAX) 200 MG capsule Take 200 mg by mouth 4 (four) times daily as needed. ?Patient not taking: Reported on 12/21/2021    [provider]  ?acyclovir (ZOVIRAX) 200 MG capsule 1 capusle Orally four times a day as needed 03/19/21     ?amantadine (SYMMETREL) 100 MG capsule Take 1 capsule (100 mg total) by mouth 2 (two) times daily. Take with first 2 dosages of levodopa 12/16/21   Tat, Eustace Quail, DO  ?carbidopa-levodopa (SINEMET IR) 25-100 MG tablet Take 1 tablet by mouth 3 (three) times daily. 12/16/21   Ludwig Clarks, DO  ?COVID-19 mRNA bivalent vaccine, Moderna, (MODERNA COVID-19 BIVAL BOOSTER) 50 MCG/0.5ML injection Inject into the muscle. 07/29/21     ?COVID-19 mRNA Vac-TriS, Pfizer, (PFIZER-BIONT COVID-19 VAC-TRIS) SUSP injection Inject into the muscle. 02/26/21   Carlyle Basques, MD  ?doxycycline (ORACEA) 40 MG capsule Take 40 mg by mouth every morning. As needed    [provider]  ?doxycycline (VIBRAMYCIN) 50 MG capsule Take 1 capsule (50 mg total) by mouth daily as needed for rosacea.  01/08/21   Mayra Neer, MD  ?influenza vaccine adjuvanted (FLUAD) 0.5 ML injection Inject into the muscle. 08/09/21   Carlyle Basques, MD  ?omeprazole (PRILOSEC) 20 MG capsule Take 1 capsule (20 mg total) by mouth daily. 08/09/21     ?sertraline (ZOLOFT) 50 MG tablet Take 50 mg by mouth daily. ?Patient not taking: Reported on 12/21/2021    [provider]  ?sertraline (ZOLOFT) 50 MG tablet Take 1 tablet (50 mg total) by mouth every evening. 10/08/21     ?tamsulosin (FLOMAX) 0.4 MG CAPS capsule 1 capsule Orally Once a day in the evening for urinary frequency 30 day(s) 12/06/21     ?traZODone (DESYREL) 50 MG tablet Take 1/2 to 1 tablet daily at bedtime as needed. 11/25/20   Mayra Neer, MD  ?traZODone (DESYREL) 50 MG tablet TAKE 1/2-1 TABLET BY MOUTH AT BEDTIME AS NEEDED 12/10/21     ?Zoster Vaccine Adjuvanted [facility]) injection Inject into the muscle. 05/19/21   Carlyle Basques, MD  ?Zoster Vaccine Adjuvanted [facility]) injection Inject into the muscle. 09/07/21   Carlyle Basques, MD  ?Zoster Vaccine Adjuvanted [facility]) injection Inject into the muscle. 10/11/21     ?   ? ?Allergies    ?Patient has no known allergies.   ? ?Review of Systems   ?Review of Systems ? ?Physical Exam ?Updated Vital Signs ?BP 103/62   Pulse 73   Temp (!) 101 ?F (38.3 ?C) (Oral)  Resp 20   SpO2 95%  ?Physical Exam ?Vitals and nursing note reviewed.  ?Constitutional:   ?   Appearance: He is well-developed.  ?HENT:  ?   Head: Normocephalic and atraumatic.  ?Eyes:  ?   Pupils: Pupils are equal, round, and reactive to light.  ?Neck:  ?   Vascular: No JVD.  ?Cardiovascular:  ?   Rate and Rhythm: Normal rate and regular rhythm.  ?   Heart sounds: No murmur heard. ?  No friction rub. No gallop.  ?Pulmonary:  ?   Effort: No respiratory distress.  ?   Breath sounds: No wheezing.  ?Abdominal:  ?   General: There is no distension.  ?   Tenderness: There is no abdominal tenderness. There is no guarding or rebound.  ?   Comments: Grimaces  with palpation of the right upper quadrant.  ?Musculoskeletal:     ?   General: Normal range of motion.  ?   Cervical back: Normal range of motion and neck supple.  ?Skin: ?   Coloration: Skin is not pale.  ?   Findings: No rash.  ?Neurological:  ?   Mental Status: He is alert and oriented to person, place, and time.  ?Psychiatric:     ?   Behavior: Behavior normal.  ? ? ?ED Results / Procedures / Treatments   ?Labs ?(all labs ordered are listed, but only abnormal results are displayed) ?Labs Reviewed  ?COMPREHENSIVE METABOLIC PANEL - Abnormal; Notable for the following components:  ?    Result Value  ? Sodium 129 (*)   ? Glucose, Bld 123 (*)   ? Calcium 8.4 (*)   ? Total Protein 6.0 (*)   ? AST 199 (*)   ? ALT 137 (*)   ? Alkaline Phosphatase 140 (*)   ? All other components within normal limits  ?CBC WITH DIFFERENTIAL/PLATELET - Abnormal; Notable for the following components:  ? RDW 10.9 (*)   ? Platelets 139 (*)   ? Lymphs Abs 0.2 (*)   ? All other components within normal limits  ?URINALYSIS, ROUTINE W REFLEX MICROSCOPIC - Abnormal; Notable for the following components:  ? Hgb urine dipstick SMALL (*)   ? Protein, ur 30 (*)   ? Bacteria, UA RARE (*)   ? All other components within normal limits  ?RESP PANEL BY RT-PCR (FLU A&B, COVID) ARPGX2  ?CULTURE, BLOOD (ROUTINE X 2)  ?CULTURE, BLOOD (ROUTINE X 2)  ?URINE CULTURE  ?LACTIC ACID, PLASMA  ?PROTIME-INR  ?APTT  ?LACTIC ACID, PLASMA  ? ? ?EKG ?EKG Interpretation ? ?Date/Time:  Thursday January 27 2022 20:04:02 EDT ?Ventricular Rate:  69 ?PR Interval:  179 ?QRS Duration: 114 ?QT Interval:  389 ?QTC Calculation: 417 ?R Axis:   -46 ?Text Interpretation: Sinus rhythm LAD, consider left anterior fascicular block No significant change since last tracing Confirmed by Deno Etienne [phone]) on 01/27/2022 8:20:52 PM ? ?Radiology ?CT Head Wo Contrast ? ?Result Date: 01/27/2022 ?CLINICAL DATA:  Increased confusion, dizziness and delirium. Low-grade fever. EXAM: CT HEAD WITHOUT  CONTRAST TECHNIQUE: Contiguous axial images were obtained from the base of the skull through the vertex without intravenous contrast. RADIATION DOSE REDUCTION: This exam was performed according to the departmental dose-optimization program which includes automated exposure control, adjustment of the mA and/or kV according to patient size and/or use of iterative reconstruction technique. COMPARISON:  MRI brain 01/02/2020. FINDINGS: Brain: Again noted are mild cerebral atrophy with mild-to-moderate white matter small vessel disease and mild atrophic ventriculomegaly.  Cerebellum and brainstem are unremarkable and normal in volume. No new asymmetry is seen worrisome for acute infarct, hemorrhage or mass. There is no midline shift or basal cisternal crowding. Vascular: There are scattered calcifications of the carotid siphons but no hyperdense central vessel. Skull: No fracture or significant skull lesion. Left frontal bone island is again noted just above left orbit. Sinuses/Orbits: Mild membrane thickening again noted in the ethmoid air cells. Other visualized sinuses, bilateral mastoid air cells are clear. There is right-sided deviation and spurring of the nasal septum. Other: There are benign dural calcifications in the frontal falx. IMPRESSION: No acute intracranial CT findings or interval changes. Stable atrophy with mild-to-moderate small vessel disease. Electronically Signed   By: Telford Nab M.D.   On: 01/27/2022 22:07  ? ?CT ABDOMEN PELVIS W CONTRAST ? ?Result Date: 01/27/2022 ?CLINICAL DATA:  Acute nonlocalized abdominal pain. Low-grade fever. 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 Previous partial colectomy. EXAM: CT ABDOMEN AND PELVIS WITH CONTRAST TECHNIQUE: Multidetector CT imaging of the abdomen and pelvis was performed using the standard protocol following bolus administration of intravenous contrast. RADIATION DOSE REDUCTION: This exam was performed according to the departmental  dose-optimization program which includes automated exposure control, adjustment of the mA and/or kV according to patient size and/or use of iterative reconstruction technique. CONTRAST:  1110m OMNIPAQUE IOHEXOL 300

## 2022-01-27 NOTE — Telephone Encounter (Signed)
Pt's wife called in stating the pt had an "episode" yesterday. She is afraid he may have had a mini stroke. He was having visual disturbances, was dizzy, was quiet, fumbled his words, and in general wasn't "right".  ?

## 2022-01-27 NOTE — Telephone Encounter (Signed)
Called patients wife with recommendations from dr. Carles Collet. Patients wife is going to call PCP to get patient screened for a UTI as well ?

## 2022-01-27 NOTE — ED Triage Notes (Signed)
Patient sent to ED by PCP for evaluation of increased confusion and dizziness. Patient is alert, temperature of 101F in triage, and in no apparent distress at this time. ?

## 2022-01-27 NOTE — ED Provider Triage Note (Signed)
Emergency Medicine Provider Triage Evaluation Note ? ?Barry Pope , a 76 y.o. male  was evaluated in triage.  Pt complains of worsening Parkinson symptoms.  Patient states that for the past 2 days tremors have been worse than usual, patient also endorses a change in the visual field in the inferior region of the right eye.  Patient states that Dr. Manuella Ghazi recommended to come to the emergency department for further evaluation ? ?Review of Systems  ?Positive: Tremors, visual disturbance ?Negative: Shortness of breath ? ?Physical Exam  ?BP (!) 152/77 (BP Location: Left Arm)   Pulse 84   Temp (!) 101 ?F (38.3 ?C) (Oral)   Resp 18   SpO2 99%  ?Gen:   Awake, no distress   ?Resp:  Normal effort  ?MSK:   Moves extremities without difficulty  ?Other:   ? ?Medical Decision Making  ?Medically screening exam initiated at 5:30 PM.  Appropriate orders placed.  [name] was informed that the remainder of the evaluation will be completed by another provider, this initial triage assessment does not replace that evaluation, and the importance of remaining in the ED until their evaluation is complete. ? ? ?  ?[name], PA-C ?01/27/22 1731 ? ?

## 2022-01-28 ENCOUNTER — Other Ambulatory Visit: Payer: Self-pay

## 2022-01-28 DIAGNOSIS — G9340 Encephalopathy, unspecified: Secondary | ICD-10-CM | POA: Diagnosis not present

## 2022-01-28 DIAGNOSIS — R9389 Abnormal findings on diagnostic imaging of other specified body structures: Secondary | ICD-10-CM | POA: Diagnosis not present

## 2022-01-28 DIAGNOSIS — E861 Hypovolemia: Secondary | ICD-10-CM | POA: Diagnosis present

## 2022-01-28 DIAGNOSIS — Z20822 Contact with and (suspected) exposure to covid-19: Secondary | ICD-10-CM | POA: Diagnosis present

## 2022-01-28 DIAGNOSIS — Z820 Family history of epilepsy and other diseases of the nervous system: Secondary | ICD-10-CM | POA: Diagnosis not present

## 2022-01-28 DIAGNOSIS — G9341 Metabolic encephalopathy: Secondary | ICD-10-CM | POA: Diagnosis present

## 2022-01-28 DIAGNOSIS — K760 Fatty (change of) liver, not elsewhere classified: Secondary | ICD-10-CM | POA: Diagnosis present

## 2022-01-28 DIAGNOSIS — G20 Parkinson's disease: Secondary | ICD-10-CM | POA: Diagnosis present

## 2022-01-28 DIAGNOSIS — Z781 Physical restraint status: Secondary | ICD-10-CM | POA: Diagnosis not present

## 2022-01-28 DIAGNOSIS — F05 Delirium due to known physiological condition: Secondary | ICD-10-CM | POA: Diagnosis not present

## 2022-01-28 DIAGNOSIS — E871 Hypo-osmolality and hyponatremia: Secondary | ICD-10-CM | POA: Diagnosis present

## 2022-01-28 DIAGNOSIS — Z85060 Personal history of malignant carcinoid tumor of small intestine: Secondary | ICD-10-CM | POA: Diagnosis not present

## 2022-01-28 DIAGNOSIS — R509 Fever, unspecified: Secondary | ICD-10-CM | POA: Diagnosis present

## 2022-01-28 DIAGNOSIS — K219 Gastro-esophageal reflux disease without esophagitis: Secondary | ICD-10-CM | POA: Diagnosis present

## 2022-01-28 DIAGNOSIS — R451 Restlessness and agitation: Secondary | ICD-10-CM | POA: Diagnosis not present

## 2022-01-28 DIAGNOSIS — Z79899 Other long term (current) drug therapy: Secondary | ICD-10-CM | POA: Diagnosis not present

## 2022-01-28 DIAGNOSIS — Z85038 Personal history of other malignant neoplasm of large intestine: Secondary | ICD-10-CM | POA: Diagnosis not present

## 2022-01-28 DIAGNOSIS — K5730 Diverticulosis of large intestine without perforation or abscess without bleeding: Secondary | ICD-10-CM | POA: Diagnosis present

## 2022-01-28 DIAGNOSIS — Z85030 Personal history of malignant carcinoid tumor of large intestine: Secondary | ICD-10-CM | POA: Diagnosis not present

## 2022-01-28 DIAGNOSIS — R410 Disorientation, unspecified: Secondary | ICD-10-CM | POA: Diagnosis present

## 2022-01-28 DIAGNOSIS — G3184 Mild cognitive impairment, so stated: Secondary | ICD-10-CM | POA: Diagnosis present

## 2022-01-28 DIAGNOSIS — Z9049 Acquired absence of other specified parts of digestive tract: Secondary | ICD-10-CM | POA: Diagnosis not present

## 2022-01-28 DIAGNOSIS — M1710 Unilateral primary osteoarthritis, unspecified knee: Secondary | ICD-10-CM | POA: Diagnosis present

## 2022-01-28 LAB — CBC
HCT: 37.5 % — ABNORMAL LOW (ref 39.0–52.0)
Hemoglobin: 13.2 g/dL (ref 13.0–17.0)
MCH: 31.5 pg (ref 26.0–34.0)
MCHC: 35.2 g/dL (ref 30.0–36.0)
MCV: 89.5 fL (ref 80.0–100.0)
Platelets: 135 10*3/uL — ABNORMAL LOW (ref 150–400)
RBC: 4.19 MIL/uL — ABNORMAL LOW (ref 4.22–5.81)
RDW: 11.1 % — ABNORMAL LOW (ref 11.5–15.5)
WBC: 4.3 10*3/uL (ref 4.0–10.5)
nRBC: 0 % (ref 0.0–0.2)

## 2022-01-28 LAB — HEPATITIS PANEL, ACUTE
HCV Ab: NONREACTIVE
Hep A IgM: NONREACTIVE
Hep B C IgM: NONREACTIVE
Hepatitis B Surface Ag: NONREACTIVE

## 2022-01-28 LAB — RESPIRATORY PANEL BY PCR

## 2022-01-28 LAB — COMPREHENSIVE METABOLIC PANEL
ALT: 188 U/L — ABNORMAL HIGH (ref 0–44)
AST: 148 U/L — ABNORMAL HIGH (ref 15–41)
Albumin: 3.1 g/dL — ABNORMAL LOW (ref 3.5–5.0)
Alkaline Phosphatase: 137 U/L — ABNORMAL HIGH (ref 38–126)
Anion gap: 7 (ref 5–15)
BUN: 10 mg/dL (ref 8–23)
CO2: 23 mmol/L (ref 22–32)
Calcium: 8.2 mg/dL — ABNORMAL LOW (ref 8.9–10.3)
Chloride: 99 mmol/L (ref 98–111)
Creatinine, Ser: 1.16 mg/dL (ref 0.61–1.24)
GFR, Estimated: >60 mL/min (ref >60)
Glucose, Bld: 161 mg/dL — ABNORMAL HIGH (ref 70–99)
Potassium: 3.7 mmol/L (ref 3.5–5.1)
Sodium: 129 mmol/L — ABNORMAL LOW (ref 135–145)
Total Bilirubin: 0.7 mg/dL (ref 0.3–1.2)
Total Protein: 5.4 g/dL — ABNORMAL LOW (ref 6.5–8.1)

## 2022-01-28 LAB — HEPATITIS B SURFACE ANTIGEN: Hepatitis B Surface Ag: NONREACTIVE

## 2022-01-28 LAB — IRON AND TIBC
Iron: 19 ug/dL — ABNORMAL LOW (ref 45–182)
Saturation Ratios: 7 % — ABNORMAL LOW (ref 17.9–39.5)
TIBC: 291 ug/dL (ref 250–450)
UIBC: 272 ug/dL

## 2022-01-28 LAB — LACTIC ACID, PLASMA: Lactic Acid, Venous: 1.3 mmol/L (ref 0.5–1.9)

## 2022-01-28 LAB — MAGNESIUM: Magnesium: 1.9 mg/dL (ref 1.7–2.4)

## 2022-01-28 LAB — TSH: TSH: 3.609 u[IU]/mL (ref 0.350–4.500)

## 2022-01-28 LAB — FERRITIN: Ferritin: 182 ng/mL (ref 24–336)

## 2022-01-28 LAB — BRAIN NATRIURETIC PEPTIDE: B Natriuretic Peptide: 86.4 pg/mL (ref 0.0–100.0)

## 2022-01-28 MED ORDER — SODIUM CHLORIDE 0.9 % IV SOLN: INTRAVENOUS | Status: DC

## 2022-01-28 NOTE — Hospital Course (Addendum)
Barry Pope is a 76 y.o. male with past medical history of Parkinson's disease, mild cognitive impairment, GERD, history of colectomy, vocal cord disorder presented to the hospital with altered mental status for 2 days with weakness.  Patient was recently seen in neurology office and was evaluated for possible UTI which was negative.  In the ED, patient was febrile with a temperature of 101F.  Labs showed hyponatremia with sodium of 129.  Urinalysis showed rare bacteria.  Urine culture and blood culture was sent from the ED.  Chest x-ray did not show any acute infiltrate but mild cardiomegaly and pulmonary vascular congestion.  CT head was unremarkable.  CT of the abdomen pelvis showed calcified mass at the mesentery suspicious for treated adenopathy but recommended follow-up imaging.  Patient with post colectomy and ileectomy.  Diverticulosis and prominent liver with steatosis also noted.  Patient received ceftriaxone Tylenol, IV fluids in the ED.  GI was consulted patient was admitted to the hospital for further evaluation and treatment. ? ?Assessment/Plan ? ?Principal Problem: ?  Acute metabolic encephalopathy ?Active Problems: ?  Parkinson's disease ?  Gastroesophageal reflux disease ?  History of malignant carcinoid tumor of small intestine ?  Mild cognitive impairment ?  Abnormal CXR ?  Abnormal CT scan ?  Hyponatremia ?  Transaminitis ?  ?Acute metabolic encephalopathy ?Elevated LFTs, hepatic steatosis ? ?Patient presented with acute on chronic confusion.    Has history of Parkinson's disease.  Respiratory viral panel was negative. COVID and influenza was negative.  Urine culture pending.  Blood cultures negative less than 12 hours.  We will continue to trend LFTs..  No leukocytosis.  Urinalysis notable for TSH within normal range.  GI on board for elevated LFTs.  Ferritin 182.  Hepatitis panel nonreactive.  Rest of the liver work-up was largely negative.  No fever or leukocytosis at this  time ? ?Hyponatremia ?Secondary to hypovolemia.   Initial sodium of 129.  Received normal saline during hospitalization.  Sodium of 134 today.   ? ?Abnormal chest x-ray ?With some cardiomegaly.  BNP at 86.  No oxygen requirement.   ? ?Parkinson's disease with possibility of underlying dementia. ?Continue Sinemet, sertraline, trazodone, on discharge. ? ?GERD ?Continue omeprazole ? ?History of colon cancer (carcinoid) ?Status post colectomy.  Denies GI symptoms. ? ?Abnormal CT abdomen. ?Noted to have calcified mass suspicious for treated adenopathy but due to clustering of nodes recommending further imaging per CT read.  Denied any abdominal pain nausea vomiting.  ? ?Deconditioning, debility.  Patient was seen by physical therapy who recommend home health PT OT at discharge ?

## 2022-01-28 NOTE — Progress Notes (Signed)
?  Transition of Care (TOC) Screening Note ? ? ?Patient Details  ?Name: Barry Pope ?Date of Birth: [birth year] ? ? ?Transition of Care (TOC) CM/SW Contact:    ?Pollie Friar, RN ?Phone Number: ?01/28/2022, 3:44 PM ? ? ? ?Transition of Care Department [facility]) has reviewed patient and no TOC needs have been identified at this time. We will continue to monitor patient advancement through interdisciplinary progression rounds. If new patient transition needs arise, please place a TOC consult. ?  ?

## 2022-01-28 NOTE — Progress Notes (Signed)
?PROGRESS NOTE ? ? ? ?Barry Pope  ZJI:[id number] DOB: 12/15/1945 DOA: 01/27/2022 ?PCP: Mayra Neer, MD  ? ? ?Brief Narrative:  ?Barry Pope is a 76 y.o. male with past medical history of Parkinson's disease, mild cognitive impairment, GERD, history of colectomy, vocal cord disorder presented to hospital with altered mental status for 2 days with weakness.  Patient was recently seen in neurology office and was evaluated for possible UTI which was negative.  In the ED patient was febrile with a temperature of 101.  Labs showed hyponatremia with sodium of 129.  Urinalysis showed rare bacteria.  Urine culture and blood culture was sent from the ED.  Chest x-ray did not show any acute infiltrate but mild cardiomegaly and pulmonary vascular congestion.  CT head was unremarkable.  CT of the abdomen pelvis showed calcified mass at the mesentery suspicious for treated adenopathy but recommended follow-up imaging.  Patient also noted to be status post colectomy and ileectomy.  Diverticulosis and prominent liver with steatosis also noted.  Renal cyst and prostatomegaly also noted as well.  Patient received ceftriaxone Tylenol, IV fluids in the ED.  GI was consulted and recommend observation and will evaluate in the morning. ? ?Assessment/Plan ? ?Acute metabolic encephalopathy with elevated LFT. ?Transaminitis, hepatic steatosis ?Presenting with acute on chronic confusion.  Rule out possible viral infection.  Has a history of Parkinson's disease.  Currently on supportive care.  Respiratory viral panel was negative.  COVID and influenza was negative.  Urine culture pending.  Blood cultures negative less than 12 hours.  We will continue to trend ALT AST.  AST of 148 and ALT of 188 today.  No leukocytosis.  Urinalysis was negative.  TSH within normal range.  GI has been consulted for elevated LFTs and liver work-up has been initiated. ? ?Hyponatremia ?Continue IV fluids normal saline at 100 mL/h..  Sodium of 129  from 129 on presentation.  We will continue to monitor BMP. ? ?Abnormal chest x-ray ?With some cardiomegaly.  BNP at 86.  No oxygen requirement.   ? ?Parkinson's disease ?Continue Sinemet.  Currently sertraline, trazodone, amantadine on hold. ? ?GERD ?Continue PPI ? ?History of colon cancer (carcinoid) ?Status post colectomy.  Denies recurrent GI symptoms. ? ?Abnormal CT abdomen. ?Noted to have calcified mass suspicious for treated adenopathy but due to clustering of nodes recommending further imaging per CT read.  Denies any abdominal pain nausea vomiting.  GI on board.  ? ? DVT prophylaxis: enoxaparin (LOVENOX) injection 40 mg Start: 01/28/22 1000 ? ? ?Code Status:   ?  Code Status: Full Code ? ?Disposition: Home ?Status is: Observation ?The patient will require care spanning > 2 midnights and should be moved to inpatient because: Acute on chronic confusion, possible viral infection, electrolyte imbalance, GI work-up ? ? Family Communication:  ?Spoke with  the patient's son at bedside. ? ?Consultants:  ?GI ? ?Procedures:  ?None ? ?Antimicrobials:  ?None ? ?Anti-infectives (From admission, onward)  ? ? Start     Dose/Rate Route Frequency Ordered Stop  ? 01/27/22 1945  cefTRIAXone (ROCEPHIN) 2 g in sodium chloride 0.9 % 100 mL IVPB       ? 2 g ?200 mL/hr over 30 Minutes Intravenous  Once 01/27/22 1944 01/27/22 2110  ? ?  ? ?Subjective: ?Today, patient was seen and examined at bedside.  Patient denies any nausea vomiting or abdominal pain.  Denies overt cough chest pain but had mild grade fever yesterday.  Has generalized weakness.  Patient's son  at bedside who states that he is still confused ? ?Objective: ?Vitals:  ? 01/27/22 2300 01/28/22 0015 01/28/22 0352 01/28/22 0715  ?BP: 103/62 135/68 126/69 139/83  ?Pulse: 73 72 65 78  ?Resp: 20 (!) '21 20 20  '$ ?Temp:  98.7 ?F (37.1 ?C) 98.2 ?F (36.8 ?C) 98.3 ?F (36.8 ?C)  ?TempSrc:  Oral Oral Oral  ?SpO2: 95% 96% 98% 99%  ?Weight:  73.2 kg    ?Height:  '5\' 9"'$  (1.753 m)     ? ? ?Intake/Output Summary (Last 24 hours) at 01/28/2022 0953 ?Last data filed at 01/28/2022 0923 ?Gross per 24 hour  ?Intake 609.48 ml  ?Output --  ?Net 609.48 ml  ? ?Filed Weights  ? 01/28/22 0015  ?Weight: 73.2 kg  ? ? ?Physical Examination: ? ?General:  Average built, not in obvious distress, alert awake and communicative ?HENT:   No scleral pallor or icterus noted. Oral mucosa is moist.  ?Chest:  Clear breath sounds.  Diminished breath sounds bilaterally. No crackles or wheezes.  ?CVS: S1 &S2 heard. No murmur.  Regular rate and rhythm. ?Abdomen: Soft, nontender, nondistended.  Bowel sounds are heard.   ?Extremities: No cyanosis, clubbing or edema.  Peripheral pulses are palpable. ?Psych: Alert, awake and oriented, normal mood ?CNS:  No cranial nerve deficits.  Power equal in all extremities.   ?Skin: Warm and dry.  No rashes noted. ? ?Data Reviewed:  ? ?CBC: ?Recent Labs  ?Lab 01/27/22 ?1735 01/28/22 ?0232  ?WBC 5.1 4.3  ?NEUTROABS 4.2  --   ?HGB 13.8 13.2  ?HCT 40.8 37.5*  ?MCV 94.4 89.5  ?PLT 139* 135*  ? ? ?Basic Metabolic Panel: ?Recent Labs  ?Lab 01/27/22 ?1735 01/28/22 ?0232  ?NA 129* 129*  ?K 4.3 3.7  ?CL 98 99  ?CO2 25 23  ?GLUCOSE 123* 161*  ?BUN 13 10  ?CREATININE 1.07 1.16  ?CALCIUM 8.4* 8.2*  ?MG  --  1.9  ? ? ?Liver Function Tests: ?Recent Labs  ?Lab 01/27/22 ?1735 01/28/22 ?0232  ?AST 199* 148*  ?ALT 137* 188*  ?ALKPHOS 140* 137*  ?BILITOT 0.8 0.7  ?PROT 6.0* 5.4*  ?ALBUMIN 3.5 3.1*  ? ? ? ?Radiology Studies: ?CT Head Wo Contrast ? ?Result Date: 01/27/2022 ?CLINICAL DATA:  Increased confusion, dizziness and delirium. Low-grade fever. EXAM: CT HEAD WITHOUT CONTRAST TECHNIQUE: Contiguous axial images were obtained from the base of the skull through the vertex without intravenous contrast. RADIATION DOSE REDUCTION: This exam was performed according to the departmental dose-optimization program which includes automated exposure control, adjustment of the mA and/or kV according to patient size and/or use  of iterative reconstruction technique. COMPARISON:  MRI brain 01/02/2020. FINDINGS: Brain: Again noted are mild cerebral atrophy with mild-to-moderate white matter small vessel disease and mild atrophic ventriculomegaly. Cerebellum and brainstem are unremarkable and normal in volume. No new asymmetry is seen worrisome for acute infarct, hemorrhage or mass. There is no midline shift or basal cisternal crowding. Vascular: There are scattered calcifications of the carotid siphons but no hyperdense central vessel. Skull: No fracture or significant skull lesion. Left frontal bone island is again noted just above left orbit. Sinuses/Orbits: Mild membrane thickening again noted in the ethmoid air cells. Other visualized sinuses, bilateral mastoid air cells are clear. There is right-sided deviation and spurring of the nasal septum. Other: There are benign dural calcifications in the frontal falx. IMPRESSION: No acute intracranial CT findings or interval changes. Stable atrophy with mild-to-moderate small vessel disease. Electronically Signed   By: Telford Nab  M.D.   On: 01/27/2022 22:07  ? ?CT ABDOMEN PELVIS W CONTRAST ? ?Result Date: 01/27/2022 ?CLINICAL DATA:  Acute nonlocalized abdominal pain. Low-grade fever. 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 Previous partial colectomy. EXAM: CT ABDOMEN AND PELVIS WITH CONTRAST TECHNIQUE: Multidetector CT imaging of the abdomen and pelvis was performed using the standard protocol following bolus administration of intravenous contrast. RADIATION DOSE REDUCTION: This exam was performed according to the departmental dose-optimization program which includes automated exposure control, adjustment of the mA and/or kV according to patient size and/or use of iterative reconstruction technique. CONTRAST:  147m OMNIPAQUE IOHEXOL 300 MG/ML  SOLN COMPARISON:  None. The last abdomen and pelvis CT here was 05/13/2003 and is no longer available in PACS FINDINGS: Lower  chest: Lung bases show scattered linear scarring and mild COPD change. No infiltrate is seen. The cardiac size is normal. Hepatobiliary: 20 cm length mildly steatotic liver. No mass enhancement. Gallbladder and

## 2022-01-28 NOTE — Plan of Care (Signed)
  Problem: Education: Goal: Knowledge of General Education information will improve Description: Including pain rating scale, medication(s)/side effects and non-pharmacologic comfort measures Outcome: Progressing   Problem: Coping: Goal: Level of anxiety will decrease Outcome: Progressing   Problem: Safety: Goal: Ability to remain free from injury will improve Outcome: Progressing   

## 2022-01-28 NOTE — Progress Notes (Signed)
Received patient from ED awake and oriented x4 accompanied by RN and his son via bed. Patient's handbook given. Placed call light within reach and place bed at lowest position.  ?

## 2022-01-28 NOTE — Consult Note (Signed)
[facility] Consult ? ?Referring Provider: [facility] hospitalist ?Primary Care Physician:  Mayra Neer, MD ?Primary Gastroenterologist: Dr. Annette [facility] ? ?Reason for Consultation: Abnormal LFTs ? ?HPI: Barry Pope is a 76 y.o. 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 ?History is obtained from the patient as well as his wife over the phone, Barry Pope. ?Patient was in his usual state of health until yesterday morning when he woke up with visual changes, vertigo, back pain, confusion and had a hard time talking.  He went to his primary care physician and was suspected to have a urinary tract infection, however urine analysis was within normal limits, he was sent to the ER for further evaluation. ? ?In the ED he had a temperature of 101F, was noted to have hyponatremia and slightly elevated LFTs. ?Patient has been taking omeprazole for acid reflux for many years. ?He denies difficulty swallowing, pain on swallowing, unintentional weight loss, has in fact gained a few pounds recently, he denies loss of appetite. ?He reports regular bowel movements, denies melena or hematochezia. ?Denies abdominal pain, nausea or vomiting or fever at home. ? ?Denies recent change in medications except he has been taking doxycycline for rosacea daily instead of every other day. ?Prior GI work-up: ?Ileal carcinoid discovered in colonoscopy from 2004 ?Status post ileocolonic resection and anastomosis, no evidence of metastatic disease ?Colonoscopy 2009: Unremarkable ?Colonoscopy 2015: Stable ileocolonic anastomosis, sigmoid diverticulosis ?No prior EGD ? ?Patient drinks 2 glasses of scotch daily, has been doing so for the last several years. ?He denies smoking. ? ?Past Medical History:  ?Diagnosis Date  ? 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 Allergic rhinitis   ? Dysphonia 08/27/2019  ? Gastroesophageal  reflux disease 03/26/2019  ? History of malignant carcinoid tumor of large intestine   ? History of malignant carcinoid tumor of small intestine   ? Insomnia   ? Knee osteoarthritis   ? 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 Male erectile dysfunction, unspecified   ? 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 Rosacea, unspecified   ? 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 Vocal fold atrophy   ? ? ?Past Surgical History:  ?Procedure Laterality Date  ? APPENDECTOMY    ? COLONOSCOPY  2004/2009/2015  ? INGUINAL HERNIA REPAIR Right   ? KNEE SURGERY    ? PARTIAL COLECTOMY    ? ? ?Prior to Admission medications   ?Medication Sig Start Date End Date Taking? Authorizing Provider  ?carbidopa-levodopa (SINEMET IR) 25-100 MG tablet Take 1 tablet by mouth 3 (three) times daily. 12/16/21  Yes Tat, Eustace Quail, DO  ?omeprazole (PRILOSEC) 20 MG capsule Take 1 capsule (20 mg total) by mouth daily. ?Patient taking differently: Take 20 mg by mouth every other day. 08/09/21  Yes   ?acyclovir (ZOVIRAX) 200 MG capsule Take 200 mg by mouth 4 (four) times daily as needed. ?Patient not taking: Reported on 12/21/2021    [provider]  ?acyclovir (ZOVIRAX) 200 MG capsule 1 capusle Orally four times a day as needed 03/19/21     ?amantadine (SYMMETREL) 100 MG capsule Take 1 capsule (100 mg total) by mouth 2 (two) times daily. Take with first 2 dosages of levodopa 12/16/21   Tat, Eustace Quail, DO  ?doxycycline (ORACEA) 40 MG capsule Take 40 mg by mouth every morning. As needed    [provider]  ?doxycycline (VIBRAMYCIN) 50 MG capsule Take 1 capsule (50 mg total) by mouth daily as needed for rosacea.  01/08/21   Mayra Neer, MD  ?sertraline (ZOLOFT) 50 MG tablet Take 50 mg by mouth daily. ?Patient not taking: Reported on 12/21/2021    [provider]  ?sertraline (ZOLOFT) 50 MG tablet Take 1 tablet (50 mg total) by mouth every evening. 10/08/21     ?tamsulosin (FLOMAX) 0.4 MG CAPS capsule 1 capsule Orally Once a day in the evening for urinary frequency 30  day(s) 12/06/21     ?traZODone (DESYREL) 50 MG tablet Take 1/2 to 1 tablet daily at bedtime as needed. 11/25/20   Mayra Neer, MD  ?traZODone (DESYREL) 50 MG tablet TAKE 1/2-1 TABLET BY MOUTH AT BEDTIME AS NEEDED 12/10/21     ? ? ?Current Facility-Administered Medications  ?Medication Dose Route Frequency Provider Last Rate Last Admin  ? 0.9 %  sodium chloride infusion   Intravenous Continuous Marcelyn Bruins, MD 75 mL/hr at 01/28/22 4193 Infusion Verify at 01/28/22 7902  ? acetaminophen (TYLENOL) tablet 650 mg  650 mg Oral Q6H PRN Marcelyn Bruins, MD      ? Or  ? acetaminophen (TYLENOL) suppository 650 mg  650 mg Rectal Q6H PRN Marcelyn Bruins, MD      ? carbidopa-levodopa (SINEMET IR) 25-100 MG per tablet immediate release 1 tablet  1 tablet Oral TID Marcelyn Bruins, MD      ? enoxaparin (LOVENOX) injection 40 mg  40 mg Subcutaneous Q24H Marcelyn Bruins, MD      ? pantoprazole (PROTONIX) EC tablet 40 mg  40 mg Oral Daily Marcelyn Bruins, MD      ? polyethylene glycol (MIRALAX / GLYCOLAX) packet 17 g  17 g Oral Daily PRN Marcelyn Bruins, MD      ? sodium chloride flush (NS) 0.9 % injection 3 mL  3 mL Intravenous Q12H Marcelyn Bruins, MD   3 mL at 01/27/22 2355  ? ? ?Allergies as of 01/27/2022  ? (No Known Allergies)  ? ? ?Family History  ?Problem Relation Age of Onset  ? Pulmonary fibrosis Mother   ? Heart attack Father   ? Alcoholism Father   ? Parkinson's disease Father   ? CAD Father   ? Lung cancer Sister   ? ? ?Social History  ? ?Socioeconomic History  ? Marital status: Married  ?  Spouse name: Barry Pope  ? Number of children: 2  ? Years of education: 75  ? Highest education level: Bachelor's degree (e.g., BA, AB, BS)  ?Occupational History  ? 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 Smoking status: Never  ? Smokeless tobacco: Never  ?Vaping Use  ? Vaping Use: Never used  ?Substance and Sexual Activity  ? Alcohol use: Yes  ?  Alcohol/week: 14.0 - 21.0 standard drinks  ?   Types: 14 - 21 Standard drinks or equivalent per week  ?  Comment: Couple ounces of liquor per night  ? Drug use: Not Currently  ?  Types: Marijuana  ?  Comment: Very rare "gummy" marijuana consumption  ? Sexual activity: Not on file  ?Other Topics Concern  ? Not on file  ?Social History Narrative  ? Drinks caffiene  ? Right handed   ? 2 story home  ? Lives with spouse  ? ?Social Determinants of Health  ? ?Financial Resource Strain: Not on file  ?Food Insecurity: Not on file  ?Transportation Needs: Not on file  ?Physical Activity: Not on file  ?Stress: Not on file  ?Social Connections: Not on  file  ?Intimate Partner Violence: Not on file  ? ? ?Review of Systems: Positive for: ?GI: Described in detail in HPI.    ?Gen: Denies any fever, chills, rigors, night sweats, anorexia, fatigue, weakness, malaise, involuntary weight loss, and sleep disorder ?CV: Denies chest pain, angina, palpitations, syncope, orthopnea, PND, peripheral edema, and claudication. ?Resp: Denies dyspnea, cough, sputum, wheezing, coughing up blood. ?GU : Denies urinary burning, blood in urine, urinary frequency, urinary hesitancy, nocturnal urination, and urinary incontinence. ?MS: Back pain, left shoulder pain ?Derm: Denies rash, itching, oral ulcerations, hives, unhealing ulcers.  ?Psych: confusion,memory loss, denies depression, anxiety, suicidal ideation, hallucinations.  ?Heme: Denies bruising, bleeding, and enlarged lymph nodes. ?Neuro:  dizziness, denies any headaches,  paresthesias. ?Endo:  Denies any problems with DM, thyroid, adrenal function. ? ?Physical Exam: ?Vital signs in last 24 hours: ?Temp:  [98.2 ?F (36.8 ?C)-101 ?F (38.3 ?C)] 98.3 ?F (36.8 ?C) (04/21 0715) ?Pulse Rate:  [65-84] 78 (04/21 0715) ?Resp:  [14-24] 20 (04/21 0715) ?BP: (103-163)/(62-87) 139/83 (04/21 0715) ?SpO2:  [95 %-100 %] 99 % (04/21 0715) ?Weight:  [73.2 kg] 73.2 kg (04/21 0015) ?Last BM Date : 01/26/22 ? ?General:   Alert,  Well-developed, well-nourished,  pleasant and cooperative in NAD ?Head:  Normocephalic and atraumatic. ?Eyes:  Sclera clear, no icterus.   Conjunctiva pink. ?Ears:  Normal auditory acuity. ?Nose:  No deformity, discharge,  or lesions. ?Mouth

## 2022-01-28 NOTE — Evaluation (Signed)
Physical Therapy Evaluation & Discharge ?Patient Details ?Name: Barry Pope ?MRN: [id number] ?DOB: 23-Dec-1945 ?Today's Date: 01/28/2022 ? ?History of Present Illness ? 76 y/o male presented to ED on 01/27/22 for increased confusion and dizziness. Admitted for hyponatremia and infection of unknown source. PMH: Parkinson's, hx of colon cancer, vocal cord disorder, mild cognitive impairment  ?Clinical Impression ? Patient admitted with the above. Patient functioning at modI for mobility with no AD. Patient feels at his baseline but generally unsteady due to Parkinson's disease. He has been active in exercise classes and OPPT recently. Encouraged continued mobility while hospitalized with wife. Patient with mild balance deficits noted during ambulation but no overt LOB noted and patient able to self monitor balance deficits. No further skilled PT needs identified acutely. Recommend continuation of OPPT at discharge for continued strengthening, balance, and compensatory strategies with Parkinson's.    ?   ? ?Recommendations for follow up therapy are one component of a multi-disciplinary discharge planning process, led by the attending physician.  Recommendations may be updated based on patient status, additional functional criteria and insurance authorization. ? ?Follow Up Recommendations No PT follow up (Continue OPPT as he was PTA) ? ?  ?Assistance Recommended at Discharge Frequent or constant Supervision/Assistance  ?Patient can return home with the following ?   ? ?  ?Equipment Recommendations None recommended by PT  ?Recommendations for Other Services ?    ?  ?Functional Status Assessment Patient has had a recent decline in their functional status and demonstrates the ability to make significant improvements in function in a reasonable and predictable amount of time.  ? ?  ?Precautions / Restrictions Precautions ?Precautions: Fall ?Precaution Comments: hx of Parkinson's ?Restrictions ?Weight Bearing Restrictions:  No  ? ?  ? ?Mobility ? Bed Mobility ?  ?  ?  ?  ?  ?  ?  ?General bed mobility comments: sitting in chair on arrival ?  ? ?Transfers ?Overall transfer level: Modified independent ?Equipment used: None ?  ?  ?  ?  ?  ?  ?  ?  ?  ? ?Ambulation/Gait ?Ambulation/Gait assistance: Modified independent (Device/Increase time) ?Gait Distance (Feet): 300 Feet ?Assistive device: None ?Gait Pattern/deviations: WFL(Within Functional Limits), Trunk flexed ?  ?Gait velocity interpretation: >4.37 ft/sec, indicative of normal walking speed ?  ?General Gait Details: Drifting L/R and no overt LOB noted. Patient feels close to baseline but generally unsteady ? ?Stairs ?Stairs: Yes ?Stairs assistance: Modified independent (Device/Increase time) ?Stair Management: No rails, Alternating pattern, Forwards ?Number of Stairs: 2 ?  ? ?Wheelchair Mobility ?  ? ?Modified Rankin (Stroke Patients Only) ?  ? ?  ? ?Balance Overall balance assessment: Mild deficits observed, not formally tested ?  ?  ?  ?  ?  ?  ?  ?  ?  ?  ?  ?  ?  ?  ?  ?  ?  ?  ?   ? ? ? ?Pertinent Vitals/Pain Pain Assessment ?Pain Assessment: No/denies pain  ? ? ?Home Living Family/patient expects to be discharged to:: Private residence ?Living Arrangements: Spouse/significant other ?Available Help at Discharge: Family ?Type of Home: House ?Home Access: Stairs to enter ?Entrance Stairs-Rails: Right ?Entrance Stairs-Number of Steps: 4-5 ?  ?Home Layout: Two level ?Home Equipment: None ?   ?  ?Prior Function Prior Level of Function : Independent/Modified Independent ?  ?  ?  ?  ?  ?  ?Mobility Comments: wife states he has been unsteady recently but no falls reported.  Patient states he still attends exercise classess as well as OPPT ?  ?  ? ? ?Hand Dominance  ?   ? ?  ?Extremity/Trunk Assessment  ? Upper Extremity Assessment ?Upper Extremity Assessment: Overall WFL for tasks assessed ?  ? ?Lower Extremity Assessment ?Lower Extremity Assessment: Overall WFL for tasks assessed ?   ? ?Cervical / Trunk Assessment ?Cervical / Trunk Assessment: Kyphotic  ?Communication  ? Communication: No difficulties  ?Cognition Arousal/Alertness: Awake/alert ?Behavior During Therapy: [facility] for tasks assessed/performed ?Overall Cognitive Status: History of cognitive impairments - at baseline ?  ?  ?  ?  ?  ?  ?  ?  ?  ?  ?  ?  ?  ?  ?  ?  ?General Comments: wife states he still seems confused but does have memory deficits at baseline from Parkinson's ?  ?  ? ?  ?General Comments   ? ?  ?Exercises    ? ?Assessment/Plan  ?  ?PT Assessment Patient does not need any further PT services  ?PT Problem List   ? ?   ?  ?PT Treatment Interventions     ? ?PT Goals (Current goals can be found in the Care Plan section)  ?Acute Rehab PT Goals ?Patient Stated Goal: to go home and get stronger ?PT Goal Formulation: All assessment and education complete, DC therapy ? ?  ?Frequency   ?  ? ? ?Co-evaluation   ?  ?  ?  ?  ? ? ?  ?AM-PAC PT "6 Clicks" Mobility  ?Outcome Measure Help needed turning from your back to your side while in a flat bed without using bedrails?: None ?Help needed moving from lying on your back to sitting on the side of a flat bed without using bedrails?: None ?Help needed moving to and from a bed to a chair (including a wheelchair)?: None ?Help needed standing up from a chair using your arms (e.g., wheelchair or bedside chair)?: None ?Help needed to walk in hospital room?: None ?Help needed climbing 3-5 steps with a railing? : None ?6 Click Score: 24 ? ?  ?End of Session   ?Activity Tolerance: Patient tolerated treatment well ?Patient left: in chair;with family/visitor present ?Nurse Communication: Mobility status ?PT Visit Diagnosis: Muscle weakness (generalized) (M62.81) ?  ? ?Time: 2446-9507 ?PT Time Calculation (min) (ACUTE ONLY): 24 min ? ? ?Charges:   PT Evaluation ?$PT Eval Moderate Complexity: 1 Mod ?PT Treatments ?$Gait Training: 8-22 mins ?  ?   ? ? ?Donis Pinder A. Gilford Rile, PT, DPT ?Acute Rehabilitation  Services ?Pager [phone] ?Office [phone] ? ? ?Latayna Ritchie A Matin Mattioli ?01/28/2022, 1:21 PM ? ?

## 2022-01-29 ENCOUNTER — Inpatient Hospital Stay (HOSPITAL_COMMUNITY): Payer: Medicare Other

## 2022-01-29 DIAGNOSIS — Z85060 Personal history of malignant carcinoid tumor of small intestine: Secondary | ICD-10-CM | POA: Diagnosis not present

## 2022-01-29 DIAGNOSIS — G9340 Encephalopathy, unspecified: Secondary | ICD-10-CM | POA: Diagnosis not present

## 2022-01-29 DIAGNOSIS — R9389 Abnormal findings on diagnostic imaging of other specified body structures: Secondary | ICD-10-CM | POA: Diagnosis not present

## 2022-01-29 DIAGNOSIS — K219 Gastro-esophageal reflux disease without esophagitis: Secondary | ICD-10-CM | POA: Diagnosis not present

## 2022-01-29 LAB — COMPREHENSIVE METABOLIC PANEL
ALT: 95 U/L — ABNORMAL HIGH (ref 0–44)
AST: 161 U/L — ABNORMAL HIGH (ref 15–41)
Albumin: 3 g/dL — ABNORMAL LOW (ref 3.5–5.0)
Alkaline Phosphatase: 177 U/L — ABNORMAL HIGH (ref 38–126)
Anion gap: 7 (ref 5–15)
BUN: 10 mg/dL (ref 8–23)
CO2: 22 mmol/L (ref 22–32)
Calcium: 8 mg/dL — ABNORMAL LOW (ref 8.9–10.3)
Chloride: 103 mmol/L (ref 98–111)
Creatinine, Ser: 0.96 mg/dL (ref 0.61–1.24)
GFR, Estimated: >60 mL/min (ref >60)
Glucose, Bld: 116 mg/dL — ABNORMAL HIGH (ref 70–99)
Potassium: 3.9 mmol/L (ref 3.5–5.1)
Sodium: 132 mmol/L — ABNORMAL LOW (ref 135–145)
Total Bilirubin: 1.1 mg/dL (ref 0.3–1.2)
Total Protein: 5.3 g/dL — ABNORMAL LOW (ref 6.5–8.1)

## 2022-01-29 LAB — CBC
HCT: 36.1 % — ABNORMAL LOW (ref 39.0–52.0)
Hemoglobin: 12.8 g/dL — ABNORMAL LOW (ref 13.0–17.0)
MCH: 31.8 pg (ref 26.0–34.0)
MCHC: 35.5 g/dL (ref 30.0–36.0)
MCV: 89.8 fL (ref 80.0–100.0)
Platelets: 137 10*3/uL — ABNORMAL LOW (ref 150–400)
RBC: 4.02 MIL/uL — ABNORMAL LOW (ref 4.22–5.81)
RDW: 11.2 % — ABNORMAL LOW (ref 11.5–15.5)
WBC: 4.5 10*3/uL (ref 4.0–10.5)
nRBC: 0 % (ref 0.0–0.2)

## 2022-01-29 LAB — HSV 2 ANTIBODY, IGG: HSV 2 Glycoprotein G Ab, IgG: <0.91 index (ref 0.00–0.90)

## 2022-01-29 LAB — MAGNESIUM: Magnesium: 1.9 mg/dL (ref 1.7–2.4)

## 2022-01-29 LAB — URINE CULTURE: Culture: NO GROWTH

## 2022-01-29 LAB — HCV INTERPRETATION

## 2022-01-29 LAB — CMV IGM: CMV IgM: <30 AU/mL (ref 0.0–29.9)

## 2022-01-29 LAB — ALPHA-1-ANTITRYPSIN: A-1 Antitrypsin, Ser: 213 mg/dL — ABNORMAL HIGH (ref 101–187)

## 2022-01-29 LAB — HCV AB W REFLEX TO QUANT PCR: HCV Ab: NONREACTIVE

## 2022-01-29 LAB — CERULOPLASMIN: Ceruloplasmin: 27.5 mg/dL (ref 16.0–31.0)

## 2022-01-29 LAB — HSV 1 ANTIBODY, IGG: HSV 1 Glycoprotein G Ab, IgG: 35.1 index — ABNORMAL HIGH (ref 0.00–0.90)

## 2022-01-29 MED ORDER — SERTRALINE HCL 50 MG PO TABS
50.0000 mg | ORAL_TABLET | Freq: Every day | ORAL | Status: DC
  Administered 2022-01-29: 50 mg via ORAL
  Filled 2022-01-29: qty 1

## 2022-01-29 MED ORDER — TRAZODONE HCL 50 MG PO TABS
50.0000 mg | ORAL_TABLET | Freq: Every day | ORAL | Status: DC
  Administered 2022-01-29: 50 mg via ORAL
  Filled 2022-01-29: qty 1

## 2022-01-29 MED ORDER — AMANTADINE HCL 100 MG PO CAPS
100.0000 mg | ORAL_CAPSULE | Freq: Two times a day (BID) | ORAL | Status: DC
  Administered 2022-01-29 – 2022-01-30 (×3): 100 mg via ORAL
  Filled 2022-01-29 (×3): qty 1

## 2022-01-29 MED ORDER — VITAMIN D 25 MCG (1000 UNIT) PO TABS
1000.0000 [IU] | ORAL_TABLET | Freq: Every day | ORAL | Status: DC
  Administered 2022-01-30: 1000 [IU] via ORAL
  Filled 2022-01-29: qty 1

## 2022-01-29 NOTE — Progress Notes (Signed)
[facility] night cross cover note: ? ?I was notified by RN that patient is agitated, pulling off his telemetry leads as well as pulling at his peripheral IV, with these behaviors refractory to attempts at verbal redirection.  In the setting of associated interference with ongoing medical treatment posing potential harm to himself, I have placed orders for soft bilateral wrist restraints. ? ? ? ? ?Babs Bertin, DO ?Hospitalist ? ?

## 2022-01-29 NOTE — Progress Notes (Signed)
OT Cancellation Note ? ?Patient Details ?Name: Barry Pope ?MRN: [id number] ?DOB: [birth year] ? ? ?Cancelled Treatment:    Reason Eval/Treat Not Completed: Other (comment) (Pt alseep upon arrival, per wife he did not sleep well last night. OT evaluation to f/u when pt is more alert.) ? ?Herndon Grill A [name] ?01/29/2022, 3:50 PM ?

## 2022-01-29 NOTE — Progress Notes (Signed)
Subjective: ?Patient is very confused. ?He is on soft restraints and mittens as he started pulling off his telemetry leads. ?Currently has a condom catheter. ?Appears comfortable and awake but confused. ? ?Objective: ?Vital signs in last 24 hours: ?Temp:  [97.9 ?F (36.6 ?C)-100.2 ?F (37.9 ?C)] 98.5 ?F (36.9 ?C) (04/22 0735) ?Pulse Rate:  [67-83] 83 (04/22 0735) ?Resp:  [18-20] 18 (04/22 0735) ?BP: (131-177)/(72-95) 153/84 (04/22 0735) ?SpO2:  [96 %-98 %] 97 % (04/22 0735) ?Weight change:  ?Last BM Date :  (PTA 4/19) ? ?PE: Appears stated age, no pallor, no icterus ?GENERAL: Able to speak in full sentences but not oriented to place or time  ?ABDOMEN: Soft, nondistended, nontender, normoactive bowel sounds ?EXTREMITIES: No deformity, no edema ? ?Lab Results: ?Results for orders placed or performed during the hospital encounter of 01/27/22 (from the past 48 hour(s))  ?Comprehensive metabolic panel     Status: Abnormal  ? Collection Time: 01/27/22  5:35 PM  ?Result Value Ref Range  ? Sodium 129 (L) 135 - 145 mmol/L  ? Potassium 4.3 3.5 - 5.1 mmol/L  ? Chloride 98 98 - 111 mmol/L  ? CO2 25 22 - 32 mmol/L  ? Glucose, Bld 123 (H) 70 - 99 mg/dL  ?  Comment: Glucose reference range applies only to samples taken after fasting for at least 8 hours.  ? BUN 13 8 - 23 mg/dL  ? Creatinine, Ser 1.07 0.61 - 1.24 mg/dL  ? Calcium 8.4 (L) 8.9 - 10.3 mg/dL  ? Total Protein 6.0 (L) 6.5 - 8.1 g/dL  ? Albumin 3.5 3.5 - 5.0 g/dL  ? AST 199 (H) 15 - 41 U/L  ? ALT 137 (H) 0 - 44 U/L  ? Alkaline Phosphatase 140 (H) 38 - 126 U/L  ? Total Bilirubin 0.8 0.3 - 1.2 mg/dL  ? GFR, Estimated >60 >60 mL/min  ?  Comment: (NOTE) ?Calculated using the CKD-EPI Creatinine Equation (2021) ?  ? Anion gap 6 5 - 15  ?  Comment: Performed at [facility], [place] [address] [place], [place] [address]  ?CBC with Differential     Status: Abnormal  ? Collection Time: 01/27/22  5:35 PM  ?Result Value Ref Range  ? WBC 5.1 4.0 - 10.5 K/uL  ? RBC 4.32 4.22 - 5.81  MIL/uL  ? Hemoglobin 13.8 13.0 - 17.0 g/dL  ? HCT 40.8 39.0 - 52.0 %  ? MCV 94.4 80.0 - 100.0 fL  ? MCH 31.9 26.0 - 34.0 pg  ? MCHC 33.8 30.0 - 36.0 g/dL  ? RDW 10.9 (L) 11.5 - 15.5 %  ? Platelets 139 (L) 150 - 400 K/uL  ?  Comment: REPEATED TO VERIFY  ? nRBC 0.0 0.0 - 0.2 %  ? Neutrophils Relative % 80 %  ? Neutro Abs 4.2 1.7 - 7.7 K/uL  ? Lymphocytes Relative 5 %  ? Lymphs Abs 0.2 (L) 0.7 - 4.0 K/uL  ? Monocytes Relative 12 %  ? Monocytes Absolute 0.6 0.1 - 1.0 K/uL  ? Eosinophils Relative 1 %  ? Eosinophils Absolute 0.0 0.0 - 0.5 K/uL  ? Basophils Relative 1 %  ? Basophils Absolute 0.0 0.0 - 0.1 K/uL  ? Immature Granulocytes 1 %  ? Abs Immature Granulocytes 0.03 0.00 - 0.07 K/uL  ?  Comment: Performed at [facility], [place] [address] [place],  [address]  ?Blood Culture (routine x 2)     Status: None (Preliminary result)  ? Collection Time: 01/27/22  7:44 PM  ? Specimen: BLOOD  ?Result Value Ref Range  ? Specimen Description BLOOD RIGHT ANTECUBITAL   ? Special Requests    ?  BOTTLES DRAWN AEROBIC AND ANAEROBIC Blood Culture adequate volume  ? Culture    ?  NO GROWTH < 12 HOURS ?Performed at [facility], [place] [address] [place], [place] [address] ?  ? Report Status PENDING   ?Lactic acid, plasma     Status: None  ? Collection Time: 01/27/22  8:05 PM  ?Result Value Ref Range  ? Lactic Acid, Venous 0.7 0.5 - 1.9 mmol/L  ?  Comment: Performed at [facility], [place] [address], [place], [place] [address]  ?Protime-INR     Status: None  ? Collection Time: 01/27/22  8:05 PM  ?Result Value Ref Range  ? Prothrombin Time 12.8 11.4 - 15.2 seconds  ? INR 1.0 0.8 - 1.2  ?  Comment: (NOTE) ?INR goal varies based on device and disease states. ?Performed at [facility], [place] [address], [place], [place] ?[address] ?  ?APTT     Status: None  ? Collection Time: 01/27/22  8:05 PM  ?Result Value Ref Range  ? aPTT 29 24 - 36 seconds  ?  Comment: Performed at [facility], [place] [address]  [place], [place] [address]  ?Blood Culture (routine x 2)     Status: None (Preliminary result)  ? Collection Time: 01/27/22  8:05 PM  ? Specimen: BLOOD LEFT ARM  ?Result Value Ref Range  ? Specimen Description BLOOD LEFT ARM   ? Special Requests    ?  BOTTLES DRAWN AEROBIC AND ANAEROBIC Blood Culture adequate volume  ? Culture    ?  NO GROWTH < 12 HOURS ?Performed at [facility], [place] [address], [place], [place] [address] ?  ? Report Status PENDING   ?Urinalysis, Routine w reflex microscopic     Status: Abnormal  ? Collection Time: 01/27/22  8:05 PM  ?Result Value Ref Range  ? Color, Urine YELLOW YELLOW  ? APPearance CLEAR CLEAR  ? Specific Gravity, Urine 1.018 1.005 - 1.030  ? pH 7.0 5.0 - 8.0  ? Glucose, UA NEGATIVE NEGATIVE mg/dL  ? Hgb urine dipstick SMALL (A) NEGATIVE  ? Bilirubin Urine NEGATIVE NEGATIVE  ? Ketones, ur NEGATIVE NEGATIVE mg/dL  ? Protein, ur 30 (A) NEGATIVE mg/dL  ? Nitrite NEGATIVE NEGATIVE  ? Leukocytes,Ua NEGATIVE NEGATIVE  ? RBC / HPF 0-5 0 - 5 RBC/hpf  ? WBC, UA 0-5 0 - 5 WBC/hpf  ? Bacteria, UA RARE (A) NONE SEEN  ? Squamous Epithelial / LPF 0-5 0 - 5  ? Mucus PRESENT   ?  Comment: Performed at [facility], [place] [address], [place], [place] [address]  ?Resp Panel by RT-PCR (Flu A&B, Covid)     Status: None  ? Collection Time: 01/27/22  8:05 PM  ? Specimen: Nasopharyngeal(NP) swabs in vial transport medium  ?Result Value Ref Range  ? SARS Coronavirus 2 by RT PCR NEGATIVE NEGATIVE  ?  Comment: (NOTE) ?SARS-CoV-2 target nucleic acids are NOT DETECTED. ? ?The SARS-CoV-2 RNA is generally detectable in upper respiratory ?specimens during the acute phase of infection. The lowest ?concentration of SARS-CoV-2 viral copies this assay can detect is ?138 copies/mL. A negative result does not preclude SARS-Cov-2 ?infection and should not be used as the sole basis for treatment or ?other patient management decisions. A negative result may occur with  ?  improper specimen collection/handling,  submission of specimen other ?than nasopharyngeal swab, presence of viral mutation(s) within the ?areas targeted by this assay, and inadequate number of viral ?copies(<138 copies/mL). A negative result must be combined with ?clinical observations, patient history, and epidemiological ?information. The expected result is Negative. ? ?Fact Sheet for Patients:  ?EntrepreneurPulse.com.au ? ?Fact Sheet for Healthcare Providers:  ?IncredibleEmployment.be ? ?This test is no t yet approved or cleared by the [place] FDA and  ?has been authorized for detection and/or diagnosis of SARS-CoV-2 by ?FDA under an Emergency Use Authorization (EUA). This EUA will remain  ?in effect (meaning this test can be used) for the duration of the ?COVID-19 declaration under Section 564(b)(1) of the Act, 21 ?U.S.C.section 360bbb-3(b)(1), unless the authorization is terminated  ?or revoked sooner.  ? ? ?  ? Influenza A by PCR NEGATIVE NEGATIVE  ? Influenza B by PCR NEGATIVE NEGATIVE  ?  Comment: (NOTE) ?The Xpert Xpress SARS-CoV-2/FLU/RSV plus assay is intended as an aid ?in the diagnosis of influenza from Nasopharyngeal swab specimens and ?should not be used as a sole basis for treatment. Nasal washings and ?aspirates are unacceptable for Xpert Xpress SARS-CoV-2/FLU/RSV ?testing. ? ?Fact Sheet for Patients: ?EntrepreneurPulse.com.au ? ?Fact Sheet for Healthcare Providers: ?IncredibleEmployment.be ? ?This test is not yet approved or cleared by the [place] FDA and ?has been authorized for detection and/or diagnosis of SARS-CoV-2 by ?FDA under an Emergency Use Authorization (EUA). This EUA will remain ?in effect (meaning this test can be used) for the duration of the ?COVID-19 declaration under Section 564(b)(1) of the Act, 21 U.S.C. ?section 360bbb-3(b)(1), unless the authorization is terminated or ?revoked. ? ?Performed at [facility], [place] [address],  [place], [place] ?[address] ?  ?Respiratory (~20 pathogens) panel by PCR     Status: None  ? Collection Time: 01/28/22 12:10 AM  ? Specimen: Nasopharyngeal Swab; Respiratory  ?Result Value Ref Range  ? Adenovirus NOT D

## 2022-01-29 NOTE — Progress Notes (Signed)
?PROGRESS NOTE ? ? ? ?Barry Pope  ACZ:[id number] DOB: 1946-06-01 DOA: 01/27/2022 ?PCP: Mayra Neer, MD  ? ? ?Brief Narrative:  ?Barry Pope is a 76 y.o. male with past medical history of Parkinson's disease, mild cognitive impairment, GERD, history of colectomy, vocal cord disorder presented to the hospital with altered mental status for 2 days with weakness.  Patient was recently seen in neurology office and was evaluated for possible UTI which was negative.  In the ED, patient was febrile with a temperature of 101F.  Labs showed hyponatremia with sodium of 129.  Urinalysis showed rare bacteria.  Urine culture and blood culture was sent from the ED.  Chest x-ray did not show any acute infiltrate but mild cardiomegaly and pulmonary vascular congestion.  CT head was unremarkable.  CT of the abdomen pelvis showed calcified mass at the mesentery suspicious for treated adenopathy but recommended follow-up imaging.  Patient with post colectomy and ileectomy.  Diverticulosis and prominent liver with steatosis also noted.  Patient received ceftriaxone Tylenol, IV fluids in the ED.  GI was consulted patient was admitted to the hospital for further evaluation and treatment. ? ?Assessment/Plan ? ?Principal Problem: ?  Acute metabolic encephalopathy ?Active Problems: ?  Parkinson's disease ?  Gastroesophageal reflux disease ?  History of malignant carcinoid tumor of small intestine ?  Mild cognitive impairment ?  Abnormal CXR ?  Abnormal CT scan ?  Hyponatremia ?  Transaminitis ?  ?Acute metabolic encephalopathy ?Elevated LFTs, hepatic steatosis ? ?Presenting with acute on chronic confusion.  Rule out possible viral infection.  Has a history of Parkinson's disease.  Respiratory viral panel was negative. COVID and influenza was negative.  Urine culture pending.  Blood cultures negative less than 12 hours.  We will continue to trend LFTs..  No leukocytosis.  Urinalysis was negative.  TSH within normal range.  GI  on board for elevated LFTs.  Ferritin 182.  Hepatitis panel nonreactive.  Rest of the liver work-up has been sent which is pending.  Temperature max of 100.2 F.  No leukocytosis.  We will continue to trend for fever.  Patient was administered overnight.  Did not sleep well.  We will restart his home medication including trazodone starting tonight.  Likely hospital induced delirium.  Spoke with GI regarding LFTs. ? ?Hyponatremia ?Secondary to hypovolemia.  Improving.  Initial sodium of 129.  Received normal saline.  Sodium of 132 today.  We will continue to monitor.  ? ?Abnormal chest x-ray ?With some cardiomegaly.  BNP at 86.  No oxygen requirement.   ? ?Parkinson's disease with possibility of underlying dementia. ?Continue Sinemet.  Currently sertraline, trazodone, amantadine on hold due to altered mental status.  We will restart since he was unable to sleep and appears to be more confused and disoriented, on restraints this morning.  Continue delirium precautions.  Patient follows up with his neurologist as outpatient ? ?GERD ?Continue PPI ? ?History of colon cancer (carcinoid) ?Status post colectomy.  Denies GI symptoms. ? ?Abnormal CT abdomen. ?Noted to have calcified mass suspicious for treated adenopathy but due to clustering of nodes recommending further imaging per CT read.  Denies any abdominal pain nausea vomiting.  GI on board.  ? ? DVT prophylaxis: enoxaparin (LOVENOX) injection 40 mg Start: 01/28/22 1000 ? ? ?Code Status:   ?  Code Status: Full Code ? ?Disposition: Home ? ?Status is: Inpatient ? ?The patient is inpatient because: Acute on chronic confusion, possible viral infection, electrolyte imbalance, GI work-up ? ?  Family Communication:  ?Spoke with  the patient's son at bedside and updated him about the clinical condition of the patient. ? ?Consultants:  ?GI ? ?Procedures:  ?None ? ?Antimicrobials:  ?None ? ?Anti-infectives (From admission, onward)  ? ? Start     Dose/Rate Route Frequency Ordered  Stop  ? 01/27/22 1945  cefTRIAXone (ROCEPHIN) 2 g in sodium chloride 0.9 % 100 mL IVPB       ? 2 g ?200 mL/hr over 30 Minutes Intravenous  Once 01/27/22 1944 01/27/22 2110  ? ?  ? ?Subjective: ?Today, patient was seen and examined at bedside.  Appears to be confused disoriented today.  No distress.  Denies any pain, nausea, vomiting  ? ?Objective: ?Vitals:  ? 01/29/22 0022 01/29/22 0312 01/29/22 0735 01/29/22 1124  ?BP: (!) 177/95 (!) 144/80 (!) 153/84 129/84  ?Pulse: 79 68 83 84  ?Resp: '20 20 18 18  '$ ?Temp: 98.2 ?F (36.8 ?C) 98.5 ?F (36.9 ?C) 98.5 ?F (36.9 ?C) 98.3 ?F (36.8 ?C)  ?TempSrc: Oral Oral Oral Oral  ?SpO2: 97% 97% 97% 98%  ?Weight:      ?Height:      ? ? ?Intake/Output Summary (Last 24 hours) at 01/29/2022 1347 ?Last data filed at 01/29/2022 1110 ?Gross per 24 hour  ?Intake 760 ml  ?Output 1750 ml  ?Net -990 ml  ? ?Filed Weights  ? 01/28/22 0015  ?Weight: 73.2 kg  ? ? ?Physical Examination: ? ?General:  Average built, not in obvious distress alert awake and communicative ?HENT:   No scleral pallor or icterus noted. Oral mucosa is moist.  ?Chest:  Clear breath sounds.  Diminished breath sounds bilaterally. No crackles or wheezes.  ?CVS: S1 &S2 heard. No murmur.  Regular rate and rhythm. ?Abdomen: Soft, nontender, nondistended.  Bowel sounds are heard.   ?Extremities: No cyanosis, clubbing or edema.  Peripheral pulses are palpable.  On restraints. ?Psych: Alert, awake confused and disoriented  ?CNS:  No cranial nerve deficits.  Power equal in all extremities.   ?Skin: Warm and dry.  No rashes noted. ? ? ?Data Reviewed:  ? ?CBC: ?Recent Labs  ?Lab 01/27/22 ?1735 01/28/22 ?0232 01/29/22 ?0232  ?WBC 5.1 4.3 4.5  ?NEUTROABS 4.2  --   --   ?HGB 13.8 13.2 12.8*  ?HCT 40.8 37.5* 36.1*  ?MCV 94.4 89.5 89.8  ?PLT 139* 135* 137*  ? ? ?Basic Metabolic Panel: ?Recent Labs  ?Lab 01/27/22 ?1735 01/28/22 ?0232 01/29/22 ?0232  ?NA 129* 129* 132*  ?K 4.3 3.7 3.9  ?CL 98 99 103  ?CO2 '25 23 22  '$ ?GLUCOSE 123* 161* 116*  ?BUN '13  10 10  '$ ?CREATININE 1.07 1.16 0.96  ?CALCIUM 8.4* 8.2* 8.0*  ?MG  --  1.9 1.9  ? ? ?Liver Function Tests: ?Recent Labs  ?Lab 01/27/22 ?1735 01/28/22 ?0232 01/29/22 ?0232  ?AST 199* 148* 161*  ?ALT 137* 188* 95*  ?ALKPHOS 140* 137* 177*  ?BILITOT 0.8 0.7 1.1  ?PROT 6.0* 5.4* 5.3*  ?ALBUMIN 3.5 3.1* 3.0*  ? ? ? ?Radiology Studies: ?CT Head Wo Contrast ? ?Result Date: 01/27/2022 ?CLINICAL DATA:  Increased confusion, dizziness and delirium. Low-grade fever. EXAM: CT HEAD WITHOUT CONTRAST TECHNIQUE: Contiguous axial images were obtained from the base of the skull through the vertex without intravenous contrast. RADIATION DOSE REDUCTION: This exam was performed according to the departmental dose-optimization program which includes automated exposure control, adjustment of the mA and/or kV according to patient size and/or use of iterative reconstruction technique. COMPARISON:  MRI brain  01/02/2020. FINDINGS: Brain: Again noted are mild cerebral atrophy with mild-to-moderate white matter small vessel disease and mild atrophic ventriculomegaly. Cerebellum and brainstem are unremarkable and normal in volume. No new asymmetry is seen worrisome for acute infarct, hemorrhage or mass. There is no midline shift or basal cisternal crowding. Vascular: There are scattered calcifications of the carotid siphons but no hyperdense central vessel. Skull: No fracture or significant skull lesion. Left frontal bone island is again noted just above left orbit. Sinuses/Orbits: Mild membrane thickening again noted in the ethmoid air cells. Other visualized sinuses, bilateral mastoid air cells are clear. There is right-sided deviation and spurring of the nasal septum. Other: There are benign dural calcifications in the frontal falx. IMPRESSION: No acute intracranial CT findings or interval changes. Stable atrophy with mild-to-moderate small vessel disease. Electronically Signed   By: Telford Nab M.D.   On: 01/27/2022 22:07  ? ?CT ABDOMEN PELVIS  W CONTRAST ? ?Result Date: 01/27/2022 ?CLINICAL DATA:  Acute nonlocalized abdominal pain. Low-grade fever. 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 Previous partial colectomy. EXAM: C

## 2022-01-29 NOTE — Progress Notes (Addendum)
Patient`s son requested to speak with MD for updates, MD text paged and Son was given an update. Patient son stated that he would like MRI done while patient is at the hospital. He has discussed this with MD. Patient son states that MD in not in favor of MRI being done. Patient son requested me to document in the chart that he (Patient son -Barry Pope)would like MRI done while patient is  admitted. ?

## 2022-01-30 DIAGNOSIS — R9389 Abnormal findings on diagnostic imaging of other specified body structures: Secondary | ICD-10-CM | POA: Diagnosis not present

## 2022-01-30 DIAGNOSIS — G9341 Metabolic encephalopathy: Secondary | ICD-10-CM

## 2022-01-30 DIAGNOSIS — Z85060 Personal history of malignant carcinoid tumor of small intestine: Secondary | ICD-10-CM | POA: Diagnosis not present

## 2022-01-30 DIAGNOSIS — K219 Gastro-esophageal reflux disease without esophagitis: Secondary | ICD-10-CM | POA: Diagnosis not present

## 2022-01-30 DIAGNOSIS — R29898 Other symptoms and signs involving the musculoskeletal system: Secondary | ICD-10-CM

## 2022-01-30 DIAGNOSIS — M6282 Rhabdomyolysis: Secondary | ICD-10-CM

## 2022-01-30 LAB — CBC
HCT: 39.2 % (ref 39.0–52.0)
Hemoglobin: 14.1 g/dL (ref 13.0–17.0)
MCH: 32.5 pg (ref 26.0–34.0)
MCHC: 36 g/dL (ref 30.0–36.0)
MCV: 90.3 fL (ref 80.0–100.0)
Platelets: 152 10*3/uL (ref 150–400)
RBC: 4.34 MIL/uL (ref 4.22–5.81)
RDW: 11 % — ABNORMAL LOW (ref 11.5–15.5)
WBC: 5.1 10*3/uL (ref 4.0–10.5)
nRBC: 0 % (ref 0.0–0.2)

## 2022-01-30 LAB — BASIC METABOLIC PANEL
Anion gap: 11 (ref 5–15)
BUN: 9 mg/dL (ref 8–23)
CO2: 22 mmol/L (ref 22–32)
Calcium: 8.5 mg/dL — ABNORMAL LOW (ref 8.9–10.3)
Chloride: 101 mmol/L (ref 98–111)
Creatinine, Ser: 1.01 mg/dL (ref 0.61–1.24)
GFR, Estimated: >60 mL/min (ref >60)
Glucose, Bld: 107 mg/dL — ABNORMAL HIGH (ref 70–99)
Potassium: 4 mmol/L (ref 3.5–5.1)
Sodium: 134 mmol/L — ABNORMAL LOW (ref 135–145)

## 2022-01-30 LAB — MAGNESIUM: Magnesium: 1.9 mg/dL (ref 1.7–2.4)

## 2022-01-30 LAB — MITOCHONDRIAL ANTIBODIES: Mitochondrial M2 Ab, IgG: <20 U (ref 0.0–20.0)

## 2022-01-30 LAB — ANTI-SMOOTH MUSCLE ANTIBODY, IGG: F-Actin IgG: 21 U — ABNORMAL HIGH (ref 0–19)

## 2022-01-30 LAB — EPSTEIN-BARR VIRUS VCA, IGM: EBV VCA IgM: 39.9 U/mL — ABNORMAL HIGH (ref 0.0–35.9)

## 2022-01-30 NOTE — Discharge Summary (Addendum)
Physician Discharge Summary   Patient: Barry Pope MRN: [id number] DOB: 1946-01-16  Admit date:     01/27/2022  Discharge date: 01/30/22  Discharge Physician: Joycelyn Das   PCP: [name], MD   Recommendations at discharge:   Follow-up with your primary care physician in 1 week.   Check CBC ,BMP, magnesium, phosphorus and LFT in the next visit.  Discharge Diagnoses: Principal Problem:   Acute metabolic encephalopathy Active Problems:   Parkinson's disease   Gastroesophageal reflux disease   History of malignant carcinoid tumor of small intestine   Mild cognitive impairment   Abnormal CXR   Abnormal CT scan   Hyponatremia   Transaminitis   Muscular deconditioning  Resolved Problems:   * No resolved hospital problems. *  Hospital Course: Barry Pope is a 76 y.o. male with past medical history of Parkinson's disease, mild cognitive impairment, GERD, history of colectomy, vocal cord disorder presented to the hospital with altered mental status for 2 days with weakness.  Patient was recently seen in neurology office and was evaluated for possible UTI which was negative.  In the ED, patient was febrile with a temperature of 101F.  Labs showed hyponatremia with sodium of 129.  Urinalysis showed rare bacteria.  Urine culture and blood culture was sent from the ED.  Chest x-ray did not show any acute infiltrate but mild cardiomegaly and pulmonary vascular congestion.  CT head was unremarkable.  CT of the abdomen pelvis showed calcified mass at the mesentery suspicious for treated adenopathy but recommended follow-up imaging.  Patient with post colectomy and ileectomy.  Diverticulosis and prominent liver with steatosis also noted.  Patient received ceftriaxone Tylenol, IV fluids in the ED.  GI was consulted patient was admitted to the hospital for further evaluation and treatment.  Assessment/Plan  Principal Problem:   Acute metabolic encephalopathy Active Problems:    Parkinson's disease   Gastroesophageal reflux disease   History of malignant carcinoid tumor of small intestine   Mild cognitive impairment   Abnormal CXR   Abnormal CT scan   Hyponatremia   Transaminitis   Acute metabolic encephalopathy Elevated LFTs, hepatic steatosis  Patient presented with acute on chronic confusion likely multifactorial from hyponatremia, hypovolemia and fever.    Has history of Parkinson's disease.  Respiratory viral panel was negative. COVID and influenza was negative.  Urine culture pending.  Blood cultures negative less than 12 hours.  We will continue to trend LFTs..  No leukocytosis.  Urinalysis notable for TSH within normal range.  GI on board for elevated LFTs.  Ferritin 182.  Hepatitis panel nonreactive.  Rest of the liver work-up was largely negative.  No fever or leukocytosis at this time  Hyponatremia Secondary to hypovolemia.   Initial sodium of 129.  Received normal saline during hospitalization.  Sodium of 134 today.    Abnormal chest x-ray With some cardiomegaly.  BNP at 86.  No oxygen requirement.    Parkinson's disease with possibility of underlying dementia. Continue Sinemet, sertraline, trazodone, on discharge.  GERD Continue omeprazole  History of colon cancer (carcinoid) Status post colectomy.  Denies GI symptoms.  Abnormal CT abdomen. Noted to have calcified mass suspicious for treated adenopathy but due to clustering of nodes recommending further imaging per CT read.  Denied any abdominal pain nausea vomiting.   Deconditioning, debility.  Patient was seen by physical therapy who recommend home health PT OT at discharge  Consultants:  GI  Procedures performed: None  Disposition: Home health. Spoke with  the patient's son at bedside.  Diet recommendation:  Discharge Diet Orders (From admission, onward)     Start     Ordered   01/30/22 0000  Diet - low sodium heart healthy        01/30/22 1413           Cardiac  diet DISCHARGE MEDICATION: Allergies as of 01/30/2022   No Known Allergies      Medication List     STOP taking these medications    amantadine 100 MG capsule Commonly known as: SYMMETREL       TAKE these medications    acyclovir 200 MG capsule Commonly known as: ZOVIRAX Take 1 capsule by mouth 4 times daily as needed (1 capusle Orally four times a day as needed) What changed:  how much to take how to take this when to take this reasons to take this   carbidopa-levodopa 25-100 MG tablet Commonly known as: SINEMET IR Take 1 tablet by mouth 3 (three) times daily. What changed:  when to take this additional instructions   cholecalciferol 25 MCG (1000 UNIT) tablet Commonly known as: VITAMIN D3 Take 1,000 Units by mouth daily.   doxycycline 50 MG capsule Commonly known as: VIBRAMYCIN Take 1 capsule (50 mg total) by mouth daily as needed for rosacea. What changed: when to take this   ibuprofen 200 MG tablet Commonly known as: ADVIL Take 400 mg by mouth every 6 (six) hours as needed for mild pain.   MELATONIN PO Take 1 tablet by mouth at bedtime.   omeprazole 20 MG capsule Commonly known as: PRILOSEC Take 1 capsule (20 mg total) by mouth daily.   sertraline 50 MG tablet Commonly known as: ZOLOFT Take 1 tablet (50 mg total) by mouth every evening. What changed:  how much to take how to take this when to take this   tamsulosin 0.4 MG Caps capsule Commonly known as: Flomax Take 1 capsule by mouth once daily in the evening for urinary frequency. (1 capsule Orally Once a day in the evening for urinary frequency 30 day(s)) What changed:  how much to take when to take this   traZODone 50 MG tablet Commonly known as: DESYREL Take 0.5 to 1 tablet by mouth at bedtime as needed. (TAKE 1/2-1 TABLET BY MOUTH AT BEDTIME AS NEEDED) What changed:  how much to take how to take this when to take this       Subjective:  Today, patient was seen and examined  bedside.  Patient is more alert awake and oriented.  He wants to go home today.  Discharge Exam: Filed Weights   01/28/22 0015  Weight: 73.2 kg      01/30/2022   12:25 PM 01/30/2022    8:11 AM 01/30/2022    4:00 AM  Vitals with BMI  Systolic 127 129 829  Diastolic 76 75 83  Pulse 70 82 60    General:  Average built, not in obvious distress HENT:   No scleral pallor or icterus noted. Oral mucosa is moist.  Chest:  Clear breath sounds.  Diminished breath sounds bilaterally. No crackles or wheezes.  CVS: S1 &S2 heard. No murmur.  Regular rate and rhythm. Abdomen: Soft, nontender, nondistended.  Bowel sounds are heard.   Extremities: No cyanosis, clubbing or edema.  Peripheral pulses are palpable. Psych: Alert, awake and oriented to place and person. CNS:  No cranial nerve deficits.  Power equal in all extremities.   Skin: Warm and dry.  No  rashes noted.   Condition at discharge: good  The results of significant diagnostics from this hospitalization (including imaging, microbiology, ancillary and laboratory) are listed below for reference.   Imaging Studies: CT Head Wo Contrast  Result Date: 01/27/2022 CLINICAL DATA:  Increased confusion, dizziness and delirium. Low-grade fever. EXAM: CT HEAD WITHOUT CONTRAST TECHNIQUE: Contiguous axial images were obtained from the base of the skull through the vertex without intravenous contrast. RADIATION DOSE REDUCTION: This exam was performed according to the departmental dose-optimization program which includes automated exposure control, adjustment of the mA and/or kV according to patient size and/or use of iterative reconstruction technique. COMPARISON:  MRI brain 01/02/2020. FINDINGS: Brain: Again noted are mild cerebral atrophy with mild-to-moderate white matter small vessel disease and mild atrophic ventriculomegaly. Cerebellum and brainstem are unremarkable and normal in volume. No new asymmetry is seen worrisome for acute infarct, hemorrhage  or mass. There is no midline shift or basal cisternal crowding. Vascular: There are scattered calcifications of the carotid siphons but no hyperdense central vessel. Skull: No fracture or significant skull lesion. Left frontal bone island is again noted just above left orbit. Sinuses/Orbits: Mild membrane thickening again noted in the ethmoid air cells. Other visualized sinuses, bilateral mastoid air cells are clear. There is right-sided deviation and spurring of the nasal septum. Other: There are benign dural calcifications in the frontal falx. IMPRESSION: No acute intracranial CT findings or interval changes. Stable atrophy with mild-to-moderate small vessel disease. Electronically Signed   By: Almira Bar M.D.   On: 01/27/2022 22:07   MR BRAIN WO CONTRAST  Result Date: 01/29/2022 CLINICAL DATA:  Provided history: Encephalopathy. EXAM: MRI HEAD WITHOUT CONTRAST TECHNIQUE: Multiplanar, multiecho pulse sequences of the brain and surrounding structures were obtained without intravenous contrast. COMPARISON:  Head CT 01/27/2022. Brain MRI 01/02/2020. FINDINGS: Mild intermittent motion degradation. Brain: Mild generalized parenchymal atrophy. Mild-to-moderate multifocal T2 FLAIR hyperintense signal abnormality within the cerebral white matter, nonspecific but compatible with chronic small vessel ischemic disease. A 2 mm nodular lesion is questioned along the course of the cochlear nerve within the right internal auditory canal (appreciated on the coronal T2 TSE sequence only, series 17, image 15). There is no acute infarct. No chronic intracranial blood products. No extra-axial fluid collection. No midline shift. Vascular: Maintained flow voids within the proximal large arterial vessels. Skull and upper cervical spine: No focal suspicious marrow lesion. Sinuses/Orbits: Visualized orbits show no acute finding. Mild mucosal thickening within the bilateral ethmoid sinuses. IMPRESSION: No evidence of acute  infarction. A 2 mm nodular lesion is questioned along the course of the cochlear nerve within the right internal auditory canal (versus volume averaging of the bony IAC). This could reflect a small vestibular schwannoma, and a non-emergent internal auditory canal protocol brain MRI with contrast is recommended for further evaluation. Mild-to-moderate chronic small vessel ischemic changes within the cerebral white matter, similar to the prior brain MRI of 01/02/2020. Mild generalized parenchymal atrophy. Mild mucosal thickening within the bilateral ethmoid sinuses. Electronically Signed   By: Jackey Loge D.O.   On: 01/29/2022 19:28   CT ABDOMEN PELVIS W CONTRAST  Result Date: 01/27/2022 CLINICAL DATA:  Acute nonlocalized abdominal pain. Low-grade fever. 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 Previous partial colectomy. EXAM: CT ABDOMEN AND PELVIS WITH CONTRAST TECHNIQUE: Multidetector CT imaging of the abdomen and pelvis was performed using the standard protocol following bolus administration of intravenous contrast. RADIATION DOSE REDUCTION: This exam was performed according to the departmental dose-optimization program which  includes automated exposure control, adjustment of the mA and/or kV according to patient size and/or use of iterative reconstruction technique. CONTRAST:  OMNIPAQUE IOHEXOL 300 MG/ML  SOLN COMPARISON:  None. The last abdomen and pelvis CT here was 05/13/2003 and is no longer available in PACS FINDINGS: Lower chest: Lung bases show scattered linear scarring and mild COPD change. No infiltrate is seen. The cardiac size is normal. Hepatobiliary: 20 cm length mildly steatotic liver. No mass enhancement. Gallbladder and bile ducts unremarkable. Pancreas: Unremarkable. Spleen: Slightly prominent spleen 12.7 cm length without mass enhancement. Adrenals/Urinary Tract: There is no adrenal mass. There is no renal cortical mass enhancement. There is a 2.4 cm parapelvic cyst  in the superior pole of the left kidney of 7.1 Hounsfield units. Calculus, hydroureteronephrosis or bladder thickening. Stomach/Bowel: Unremarkable stomach and unopacified small bowel. There has been a partial right hemicolectomy with primary ileocolic anastomosis in the subhepatic area. Moderate stool retention is seen in the ascending colonic remnant and transverse colon. There are left colonic diverticula but no evidence of colitis or acute diverticulitis. Vascular/Lymphatic: Mild aortoiliac atherosclerosis without AAA. There is a calcified 2.6 x 1.3 cm mass in the left mid abdominal mesentery, with subcentimeter in short axis lymph nodes clustered around this mass measuring up to 6 mm in short axis. There are shotty subcentimeter in short axis retroperitoneal nodes. No pelvic adenopathy is seen. There are no visible lymph nodes which are enlarged by strict short axis size criteria. Reproductive: Prostatomegaly with transverse prostate diameter 5 cm. Other: Surgical changes of prior right inguinal hernia repair. No recurrent hernia is seen. No incarcerated abdominal wall hernia. 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 There is bilateral mild hip DJD. Multilevel lumbar foraminal stenosis greatest at L3-4 and L4-5. No lytic bone lesion is seen. IMPRESSION: 1. No acute abnormality is seen in the abdomen or the pelvis. 2. 2.6 x 1.3 cm calcified mesenteric mass left mid abdomen. This was not described in the report of the 2004 study. This is most likely treated adenopathy but there are lymph nodes clustered around this measuring up to 6 mm in short axis. The lymph nodes are not pathologic by strict size criteria but the clustering is somewhat concerning. Consider PET-CT follow-up. 3. Since the  2004 exam, interval partial right hemicolectomy and presumably terminal ileectomy, with constipation and uncomplicated diverticulosis. No bowel obstruction or inflammation. 4. Mildly prominent liver, with mild steatosis. Slight splenomegaly. 5. 2.4 cm left upper pole parapelvic renal cyst. 6. Prostatomegaly. 7. Aortic atherosclerosis. Electronically Signed   By: Almira Bar M.D.   On: 01/27/2022 22:25   DG Chest Port 1 View  Result Date: 01/27/2022 CLINICAL DATA:  Sepsis EXAM: PORTABLE CHEST 1 VIEW COMPARISON:  06/06/2013 FINDINGS: Mild cardiomegaly. No focal airspace consolidation or pulmonary edema. Mild pulmonary vascular congestion. Normal pleural spaces. IMPRESSION: Mild cardiomegaly and pulmonary vascular congestion. Electronically Signed   By: Deatra Robinson M.D.   On: 01/27/2022 20:27    Microbiology: Results for orders placed or performed during the hospital encounter of 01/27/22  Blood Culture (routine x 2)     Status: None (Preliminary result)   Collection Time: 01/27/22  7:44 PM   Specimen: BLOOD  Result Value Ref Range Status   Specimen Description BLOOD RIGHT ANTECUBITAL  Final   Special Requests   Final  BOTTLES DRAWN AEROBIC AND ANAEROBIC Blood Culture adequate volume   Culture   Final    NO GROWTH 3 DAYS Performed at [facility], [address], [place], [place] [address]    Report Status PENDING  Incomplete  Blood Culture (routine x 2)     Status: None (Preliminary result)   Collection Time: 01/27/22  8:05 PM   Specimen: BLOOD LEFT ARM  Result Value Ref Range Status   Specimen Description BLOOD LEFT ARM  Final   Special Requests   Final    BOTTLES DRAWN AEROBIC AND ANAEROBIC Blood Culture adequate volume   Culture   Final    NO GROWTH 3 DAYS Performed at [facility], [address] [place], [place] [address]    Report Status PENDING  Incomplete  Urine Culture     Status: None   Collection Time: 01/27/22  8:05 PM   Specimen: Urine, Clean Catch   Result Value Ref Range Status   Specimen Description URINE, CLEAN CATCH  Final   Special Requests NONE  Final   Culture   Final    NO GROWTH Performed at [facility], [address], [place], [place] [address]    Report Status 01/29/2022 FINAL  Final  Resp Panel by RT-PCR (Flu A&B, Covid)     Status: None   Collection Time: 01/27/22  8:05 PM   Specimen: Nasopharyngeal(NP) swabs in vial transport medium  Result Value Ref Range Status   SARS Coronavirus 2 by RT PCR NEGATIVE NEGATIVE Final    Comment: (NOTE) SARS-CoV-2 target nucleic acids are NOT DETECTED.  The SARS-CoV-2 RNA is generally detectable in upper respiratory specimens during the acute phase of infection. The lowest concentration of SARS-CoV-2 viral copies this assay can detect is 138 copies/mL. A negative result does not preclude SARS-Cov-2 infection and should not be used as the sole basis for treatment or other patient management decisions. A negative result may occur with  improper specimen collection/handling, submission of specimen other than nasopharyngeal swab, presence of viral mutation(s) within the areas targeted by this assay, and inadequate number of viral copies(<138 copies/mL). A negative result must be combined with clinical observations, patient history, and epidemiological information. The expected result is Negative.  Fact Sheet for Patients:  BloggerCourse.com  Fact Sheet for Healthcare Providers:  SeriousBroker.it  This test is no t yet approved or cleared by the [place] FDA and  has been authorized for detection and/or diagnosis of SARS-CoV-2 by FDA under an Emergency Use Authorization (EUA). This EUA will remain  in effect (meaning this test can be used) for the duration of the COVID-19 declaration under Section 564(b)(1) of the Act, 21 U.S.C.section 360bbb-3(b)(1), unless the authorization is terminated  or revoked sooner.        Influenza A by PCR NEGATIVE NEGATIVE Final   Influenza B by PCR NEGATIVE NEGATIVE Final    Comment: (NOTE) The Xpert Xpress SARS-CoV-2/FLU/RSV plus assay is intended as an aid in the diagnosis of influenza from Nasopharyngeal swab specimens and should not be used as a sole basis for treatment. Nasal washings and aspirates are unacceptable for Xpert Xpress SARS-CoV-2/FLU/RSV testing.  Fact Sheet for Patients: BloggerCourse.com  Fact Sheet for Healthcare Providers: SeriousBroker.it  This test is not yet approved or cleared by the [place] FDA and has been authorized for detection and/or diagnosis of SARS-CoV-2 by FDA under an Emergency Use Authorization (EUA). This EUA will remain in effect (meaning this test can be  used) for the duration of the COVID-19 declaration under Section 564(b)(1) of the Act, 21 U.S.C. section 360bbb-3(b)(1), unless the authorization is terminated or revoked.  Performed at [facility], [address], [place], [place] [address]   Respiratory (~20 pathogens) panel by PCR     Status: None   Collection Time: 01/28/22 12:10 AM   Specimen: Nasopharyngeal Swab; Respiratory  Result Value Ref Range Status   Adenovirus NOT DETECTED NOT DETECTED Final   Coronavirus 229E NOT DETECTED NOT DETECTED Final    Comment: (NOTE) The Coronavirus on the Respiratory Panel, DOES NOT test for the novel  Coronavirus (2019 nCoV)    Coronavirus HKU1 NOT DETECTED NOT DETECTED Final   Coronavirus NL63 NOT DETECTED NOT DETECTED Final   Coronavirus OC43 NOT DETECTED NOT DETECTED Final   Metapneumovirus NOT DETECTED NOT DETECTED Final   Rhinovirus / Enterovirus NOT DETECTED NOT DETECTED Final   Influenza A NOT DETECTED NOT DETECTED Final   Influenza B NOT DETECTED NOT DETECTED Final   Parainfluenza Virus 1 NOT DETECTED NOT DETECTED Final   Parainfluenza Virus 2 NOT DETECTED NOT DETECTED Final   Parainfluenza Virus 3  NOT DETECTED NOT DETECTED Final   Parainfluenza Virus 4 NOT DETECTED NOT DETECTED Final   Respiratory Syncytial Virus NOT DETECTED NOT DETECTED Final   Bordetella pertussis NOT DETECTED NOT DETECTED Final   Bordetella Parapertussis NOT DETECTED NOT DETECTED Final   Chlamydophila pneumoniae NOT DETECTED NOT DETECTED Final   Mycoplasma pneumoniae NOT DETECTED NOT DETECTED Final    Comment: Performed at [facility], [address], [place], [place] [address]    Labs: CBC: Recent Labs  Lab 01/27/22 1735 01/28/22 0232 01/29/22 0232 01/30/22 0406  WBC 5.1 4.3 4.5 5.1  NEUTROABS 4.2  --   --   --   HGB 13.8 13.2 12.8* 14.1  HCT 40.8 37.5* 36.1* 39.2  MCV 94.4 89.5 89.8 90.3  PLT 139* 135* 137* 152   Basic Metabolic Panel: Recent Labs  Lab 01/27/22 1735 01/28/22 0232 01/29/22 0232 01/30/22 0406  NA 129* 129* 132* 134*  K 4.3 3.7 3.9 4.0  CL 98 99 103 101  CO2 25 23 22 22   GLUCOSE 123* 161* 116* 107*  BUN 13 10 10 9   CREATININE 1.07 1.16 0.96 1.01  CALCIUM 8.4* 8.2* 8.0* 8.5*  MG  --  1.9 1.9 1.9   Liver Function Tests: Recent Labs  Lab 01/27/22 1735 01/28/22 0232 01/29/22 0232  AST 199* 148* 161*  ALT 137* 188* 95*  ALKPHOS 140* 137* 177*  BILITOT 0.8 0.7 1.1  PROT 6.0* 5.4* 5.3*  ALBUMIN 3.5 3.1* 3.0*   CBG: No results for input(s): GLUCAP in the last 168 hours.  Discharge time spent: greater than 30 minutes.  Signed: Joycelyn Das, MD [facility] 01/30/2022

## 2022-01-30 NOTE — TOC Transition Note (Signed)
Transition of Care (TOC) - CM/SW Discharge Note ? ? ?Patient Details  ?Name: Barry Pope ?MRN: [id number] ?Date of Birth: 08-05-1946 ? ?Transition of Care (TOC) CM/SW Contact:  ?Carles Collet, RN ?Phone Number: ?01/30/2022, 2:41 PM ? ? ?Clinical Narrative:    ?Spoke w patient's son to discuss DC plans. ?Patient will return home. Has Needed DME, would like HH, no preference, [facility] able to accept with [facility] w/in 48 hours.  ?No other TOC needs identified.  ? ? ? ?Final next level of care: [place] ?Barriers to Discharge: No Barriers Identified ? ? ?Patient Goals and CMS Choice ?Patient states their goals for this hospitalization and ongoing recovery are:: to go home ?CMS Medicare.gov Compare Post Acute Care list provided to:: Patient ?Choice offered to / list presented to : Patient ? ?Discharge Placement ?  ?           ?  ?  ?  ?  ? ?Discharge Plan and Services ?  ?  ?           ?DME Arranged: N/A ?  ?  ?  ?  ?HH Arranged: PT, OT ?[place] Agency: [facility] ([place]) ?Date HH Agency Contacted: 01/30/22 ?Time [place]: [id number] ?Representative spoke with at [place]: Corene Cornea ? ?Social Determinants of Health (SDOH) Interventions ?  ? ? ?Readmission Risk Interventions ?   ? View : No data to display.  ?  ?  ?  ? ? ? ? ? ?

## 2022-01-30 NOTE — Progress Notes (Signed)
Nursing Discharge Note ?  ?Admit Date: 01/27/2022 ? ?Discharge date: 01/30/2022 ? ?Barry Pope is to be discharged home per MD order.  AVS completed. Reviewed with patient and son at bedside. Highlighted copy provided for patient to take home.  ?Patient able to verbalize understanding of discharge instructions. PIV removed. Patient stable upon discharge.  ? ?Discharge Instructions   ? ? Call MD for:  temperature >100.4   Complete by: As directed ?  ? Diet - low sodium heart healthy   Complete by: As directed ?  ? Discharge instructions   Complete by: As directed ?  ? Follow-up with your primary care physician in 1 week.  Check blood work at that time.  Seek medical attention for worsening symptoms.  ? Increase activity slowly   Complete by: As directed ?  ? ?  ?  ?Allergies as of 01/30/2022   ?No Known Allergies ?  ? ?  ?Medication List  ?  ? ?STOP taking these medications   ? ?amantadine 100 MG capsule ?Commonly known as: SYMMETREL ?  ? ?  ? ?TAKE these medications   ? ?acyclovir 200 MG capsule ?Commonly known as: ZOVIRAX ?Take 1 capsule by mouth 4 times daily as needed ?(1 capusle Orally four times a day as needed) ?What changed:  ?how much to take ?how to take this ?when to take this ?reasons to take this ?  ?carbidopa-levodopa 25-100 MG tablet ?Commonly known as: SINEMET IR ?Take 1 tablet by mouth 3 (three) times daily. ?What changed:  ?when to take this ?additional instructions ?  ?cholecalciferol 25 MCG (1000 UNIT) tablet ?Commonly known as: VITAMIN D3 ?Take 1,000 Units by mouth daily. ?  ?doxycycline 50 MG capsule ?Commonly known as: VIBRAMYCIN ?Take 1 capsule (50 mg total) by mouth daily as needed for rosacea. ?What changed: when to take this ?  ?ibuprofen 200 MG tablet ?Commonly known as: ADVIL ?Take 400 mg by mouth every 6 (six) hours as needed for mild pain. ?  ?MELATONIN PO ?Take 1 tablet by mouth at bedtime. ?  ?omeprazole 20 MG capsule ?Commonly known as: PRILOSEC ?Take 1 capsule (20 mg total) by  mouth daily. ?  ?sertraline 50 MG tablet ?Commonly known as: ZOLOFT ?Take 1 tablet (50 mg total) by mouth every evening. ?What changed:  ?how much to take ?how to take this ?when to take this ?  ?tamsulosin 0.4 MG Caps capsule ?Commonly known as: Flomax ?Take 1 capsule by mouth once daily in the evening for urinary frequency. ?(1 capsule Orally Once a day in the evening for urinary frequency 30 day(s)) ?What changed:  ?how much to take ?when to take this ?  ?traZODone 50 MG tablet ?Commonly known as: DESYREL ?Take 0.5 to 1 tablet by mouth at bedtime as needed. ?(TAKE 1/2-1 TABLET BY MOUTH AT BEDTIME AS NEEDED) ?What changed:  ?how much to take ?how to take this ?when to take this ?  ? ?  ?  ?Discharge Instructions/ Education: ?Discharge instructions given to patient/family with verbalized understanding. ?Discharge education completed with patient and son at bedside including: follow up instructions, medication list, discharge activities, and limitations if indicated.  ?Patient escorted via wheelchair to lobby and discharged home via private automobile.  ? ?

## 2022-01-30 NOTE — Evaluation (Signed)
Occupational Therapy Evaluation ?Patient Details ?Name: Barry Pope ?MRN: [id number] ?DOB: Sep 30, 1946 ?Today's Date: 01/30/2022 ? ? ?History of Present Illness 76 y/o male presented to ED on 01/27/22 for increased confusion and dizziness. Admitted for hyponatremia and infection of unknown source. PMH: Parkinson's, hx of colon cancer, vocal cord disorder, mild cognitive impairment  ? ?Clinical Impression ?  ?Barry Pope was evaluated s/p the above admission list. He is generally indep at baseline, per chart he has been a little unsteady recently but is able to drive himself to OPPT and exercise classes. He lives with his wife who is home 24/7 and has supportive family who can assist as needed. Upon evaluation pt demonstrated supervision level ADLs, min G given for LB tasks but anticipate he is more supervision-mod I level. He completed functional ambulation with head turns and dual tasking without AD or overt LOB. Pt did demonstrate impaired cognition needing increased time for processing, poor problem solving, and required cues multi-tasking. However he did great with immediate and delayed recall. Pt would benefit from OT acutely. Recommend d/c to home with HHOT to address safety, fall risk and higher level cognition in the home setting. ?   ? ?Recommendations for follow up therapy are one component of a multi-disciplinary discharge planning process, led by the attending physician.  Recommendations may be updated based on patient status, additional functional criteria and insurance authorization.  ? ?Follow Up Recommendations ? Home health OT  ?  ?Assistance Recommended at Discharge Intermittent Supervision/Assistance  ?Patient can return home with the following Assist for transportation;Direct supervision/assist for medications management ? ?  ?Functional Status Assessment ? Patient has had a recent decline in their functional status and demonstrates the ability to make significant improvements in function in a  reasonable and predictable amount of time.  ?Equipment Recommendations ? Tub/shower seat  ?  ?Recommendations for Other Services   ? ? ?  ?Precautions / Restrictions Precautions ?Precautions: Fall ?Precaution Comments: hx of Parkinson's ?Restrictions ?Weight Bearing Restrictions: No  ? ?  ? ?Mobility Bed Mobility ?  ?  ?  ?  ?  ?  ?  ?General bed mobility comments: in chair upon arrival - anticipate mod I ?  ? ?Transfers ?Overall transfer level: Needs assistance ?Equipment used: None ?Transfers: Sit to/from Stand ?Sit to Stand: Supervision ?  ?  ?  ?  ?  ?General transfer comment: for safety only ?  ? ?  ?Balance Overall balance assessment: Mild deficits observed, not formally tested ?  ?  ?  ?  ?  ?  ?  ?  ?  ?  ?  ?  ?  ?  ?  ?  ?  ?  ?   ? ?ADL either performed or assessed with clinical judgement  ? ?ADL Overall ADL's : Needs assistance/impaired ?  ?  ?  ?  ?  ?  ?  ?  ?  ?  ?  ?  ?  ?  ?  ?  ?  ?  ?  ?General ADL Comments: minG-supervision for all ADLs, he seemed to be concentrating extremely hard on balance, gait and safety awareness.  ? ? ? ?Vision Baseline Vision/History: 1 Wears glasses ?Ability to See in Adequate Light: 0 Adequate ?   ?   ?Perception   ?  ?Praxis   ?  ? ?Pertinent Vitals/Pain Pain Assessment ?Pain Assessment: No/denies pain  ? ? ? ?Hand Dominance Right ?  ?Extremity/Trunk Assessment Upper Extremity Assessment ?Upper Extremity  Assessment: Overall WFL for tasks assessed ?  ?Lower Extremity Assessment ?Lower Extremity Assessment: Defer to PT evaluation ?  ?  ?  ?Communication Communication ?Communication: No difficulties ?  ?Cognition Arousal/Alertness: Awake/alert ?Behavior During Therapy: [facility] for tasks assessed/performed ?Overall Cognitive Status: History of cognitive impairments - at baseline ?  ?  ?  ?  ?  ?  ?  ?  ?  ?  ?  ?  ?  ?  ?  ?  ?General Comments: pt required significantly increased time to recall "April," during a dual tasking. He demonstrated good immediate and delayed recall,  and required 1 verbal cue to accurately draw a clock with the appropriate time. ?  ?  ?General Comments  VSS on RA, sons present and supportive ? ?  ?Exercises   ?  ?Shoulder Instructions    ? ? ?Home Living Family/patient expects to be discharged to:: Private residence ?Living Arrangements: Spouse/significant other ?Available Help at Discharge: Family ?Type of Home: House ?Home Access: Stairs to enter ?Entrance Stairs-Number of Steps: 4-5 ?Entrance Stairs-Rails: Right ?Home Layout: Two level ?  ?  ?Bathroom Shower/Tub: Walk-in shower ?  ?Bathroom Toilet: Standard ?  ?  ?Home Equipment: None ?  ?  ?  ? ?  ?Prior Functioning/Environment Prior Level of Function : Independent/Modified Independent ?  ?  ?  ?  ?  ?  ?Mobility Comments: wife states he has been unsteady recently but no falls reported. Patient states he still attends exercise classess as well as OPPT ?ADLs Comments: indep, drives himself to OPPT and exercise classes ?  ? ?  ?  ?OT Problem List: Decreased activity tolerance;Impaired balance (sitting and/or standing);Decreased cognition ?  ?   ?OT Treatment/Interventions: Therapeutic exercise;Self-care/ADL training;Balance training;Patient/family education;Therapeutic activities  ?  ?OT Goals(Current goals can be found in the care plan section) Acute Rehab OT Goals ?Patient Stated Goal: home asap ?OT Goal Formulation: With patient ?Time For Goal Achievement: 02/13/22 ?Potential to Achieve Goals: Good  ?OT Frequency: Min 2X/week ?  ? ?Co-evaluation   ?  ?  ?  ?  ? ?  ?AM-PAC OT "6 Clicks" Daily Activity     ?Outcome Measure Help from another person eating meals?: None ?Help from another person taking care of personal grooming?: None ?Help from another person toileting, which includes using toliet, bedpan, or urinal?: A Little ?Help from another person bathing (including washing, rinsing, drying)?: A Little ?Help from another person to put on and taking off regular upper body clothing?: None ?Help from another  person to put on and taking off regular lower body clothing?: A Little ?6 Click Score: 21 ?  ?End of Session Equipment Utilized During Treatment: Gait belt ? ?Activity Tolerance: Patient tolerated treatment well ?Patient left: in chair;with call bell/phone within reach;with chair alarm set ? ?OT Visit Diagnosis: Unsteadiness on feet (R26.81);Muscle weakness (generalized) (M62.81)  ?              ?Time: 2637-8588 ?OT Time Calculation (min): 22 min ?Charges:  OT General Charges ?$OT Visit: 1 Visit ?OT Evaluation ?$OT Eval Moderate Complexity: 1 Mod ? ? ?Yusuf Yu A Aithana Kushner ?01/30/2022, 12:18 PM ?

## 2022-01-30 NOTE — Progress Notes (Addendum)
Physical Therapy Treatment ?Patient Details ?Name: Barry Pope ?MRN: [id number] ?DOB: 09-23-1946 ?Today's Date: 01/30/2022 ? ? ?History of Present Illness 76 y/o male presented to ED on 01/27/22 for increased confusion and dizziness. Admitted for hyponatremia and infection of unknown source. PMH: Parkinson's, hx of colon cancer, vocal cord disorder, mild cognitive impairment ? ?  ?PT Comments  ? ? Pt pleasant and cooperative with PT session; pt son present to observe as well. Pt ambulating 450 ft with no assistive device at a min guard assist level and negotiated 9 stairs with and without railings. Scoring 18/24 on the Dynamic Gait Index, indicating he is at high risk for falls. Demonstrates impaired dynamic balance and decreased safety awareness. Needs cueing and supervision for safely negotiating obstacles and stairs. Has excellent family support. Due to deficits and changes in cognition/mentation during inpatient stay, recommend HHPT safety evaluation with hopefully quick transition back to OPPT for resumption of services.  ?   ?Recommendations for follow up therapy are one component of a multi-disciplinary discharge planning process, led by the attending physician.  Recommendations may be updated based on patient status, additional functional criteria and insurance authorization. ? ?Follow Up Recommendations ? Home health PT ?  ?  ?Assistance Recommended at Discharge Frequent or constant Supervision/Assistance  ?Patient can return home with the following Assistance with cooking/housework;Assist for transportation;Help with stairs or ramp for entrance ?  ?Equipment Recommendations ? None recommended by PT  ?  ?Recommendations for Other Services   ? ? ?  ?Precautions / Restrictions Precautions ?Precautions: Fall ?Precaution Comments: hx of Parkinson's ?Restrictions ?Weight Bearing Restrictions: No  ?  ? ?Mobility ? Bed Mobility ?  ?  ?  ?  ?  ?  ?  ?General bed mobility comments: in chair upon arrival ?   ? ?Transfers ?Overall transfer level: Independent ?Equipment used: None ?  ?  ?  ?  ?  ?  ?  ?  ?  ? ?Ambulation/Gait ?Ambulation/Gait assistance: Supervision, Min guard ?Gait Distance (Feet): 450 Feet ?Assistive device: None ?Gait Pattern/deviations: WFL(Within Functional Limits), Trunk flexed ?  ?  ?  ?General Gait Details: Pt with overall good balance with level surface negotiation, requires min guard assist for challenge i.e. stepping over obstacles. unable to significantly adjust speed ? ? ?Stairs ?Stairs: Yes ?Stairs assistance: Min assist ?Stair Management: No rails, Two rails ?Number of Stairs: 9 ?General stair comments: Pt requiring up to minA due to LOB with descending, cues for use of rail and step by step for safety ? ? ?Wheelchair Mobility ?  ? ?Modified Rankin (Stroke Patients Only) ?  ? ? ?  ?Balance Overall balance assessment: Mild deficits observed, not formally tested ?  ?  ?  ?  ?  ?  ?  ?  ?  ?  ?  ?  ?  ?  ?  ?Standardized Balance Assessment ?Standardized Balance Assessment : Dynamic Gait Index ?  ?Dynamic Gait Index ?Level Surface: Normal ?Change in Gait Speed: Moderate Impairment ?Gait with Horizontal Head Turns: Normal ?Gait with Vertical Head Turns: Normal ?Gait and Pivot Turn: Normal ?Step Over Obstacle: Mild Impairment ?Step Around Obstacles: Mild Impairment ?Steps: Moderate Impairment ?Total Score: 18 ?  ? ?  ?Cognition Arousal/Alertness: Awake/alert ?Behavior During Therapy: [facility] for tasks assessed/performed ?Overall Cognitive Status: History of cognitive impairments - at baseline ?  ?  ?  ?  ?  ?  ?  ?  ?  ?  ?  ?  ?  ?  ?  ?  ?  General Comments: Pt incorrectly stating day of week was Tuesday. Able to follow 2 to 3 step commands, wayfind and navigate in hallway. Needs cues for safety awareness ?  ?  ? ?  ?Exercises   ? ?  ?General Comments General comments (skin integrity, edema, etc.): VSS on RA, sons present and supportive ?  ?  ? ?Pertinent Vitals/Pain Pain Assessment ?Pain  Assessment: No/denies pain  ? ? ?Home Living Family/patient expects to be discharged to:: Private residence ?Living Arrangements: Spouse/significant other ?Available Help at Discharge: Family ?Type of Home: House ?Home Access: Stairs to enter ?Entrance Stairs-Rails: Right ?Entrance Stairs-Number of Steps: 4-5 ?  ?Home Layout: Two level ?Home Equipment: None ?   ?  ?Prior Function    ?  ?  ?   ? ?PT Goals (current goals can now be found in the care plan section) Acute Rehab PT Goals ?Patient Stated Goal: to go home ?PT Goal Formulation: All assessment and education complete, DC therapy ?Progress towards PT goals: Progressing toward goals ? ?  ?Frequency ? ? ? Min 4X/week ? ? ? ?  ?PT Plan Discharge plan needs to be updated  ? ? ?Co-evaluation   ?  ?  ?  ?  ? ?  ?AM-PAC PT "6 Clicks" Mobility   ?Outcome Measure ? Help needed turning from your back to your side while in a flat bed without using bedrails?: None ?Help needed moving from lying on your back to sitting on the side of a flat bed without using bedrails?: None ?Help needed moving to and from a bed to a chair (including a wheelchair)?: None ?Help needed standing up from a chair using your arms (e.g., wheelchair or bedside chair)?: None ?Help needed to walk in hospital room?: A Little ?  ?6 Click Score: 19 ? ?  ?End of Session Equipment Utilized During Treatment: Gait belt ?Activity Tolerance: Patient tolerated treatment well ?Patient left: in chair;with call bell/phone within reach;with family/visitor present ?Nurse Communication: Mobility status ?PT Visit Diagnosis: Muscle weakness (generalized) (M62.81) ?  ? ? ?Time: 6599-3570 ?PT Time Calculation (min) (ACUTE ONLY): 17 min ? ?Charges:  $Therapeutic Activity: 8-22 mins          ?          ? ?Wyona Almas, PT, DPT ?Acute Rehabilitation Services ?Pager [phone] ?Office [phone] ? ? ? ?Carloine Margo Aye ?01/30/2022, 2:06 PM ? ?

## 2022-01-31 ENCOUNTER — Telehealth: Payer: Self-pay | Admitting: Neurology

## 2022-01-31 ENCOUNTER — Other Ambulatory Visit (HOSPITAL_BASED_OUTPATIENT_CLINIC_OR_DEPARTMENT_OTHER): Payer: Self-pay

## 2022-01-31 DIAGNOSIS — E871 Hypo-osmolality and hyponatremia: Secondary | ICD-10-CM | POA: Diagnosis not present

## 2022-01-31 DIAGNOSIS — G4700 Insomnia, unspecified: Secondary | ICD-10-CM | POA: Diagnosis not present

## 2022-01-31 DIAGNOSIS — M179 Osteoarthritis of knee, unspecified: Secondary | ICD-10-CM | POA: Diagnosis not present

## 2022-01-31 DIAGNOSIS — G3184 Mild cognitive impairment, so stated: Secondary | ICD-10-CM | POA: Diagnosis not present

## 2022-01-31 DIAGNOSIS — L719 Rosacea, unspecified: Secondary | ICD-10-CM | POA: Diagnosis not present

## 2022-01-31 DIAGNOSIS — K760 Fatty (change of) liver, not elsewhere classified: Secondary | ICD-10-CM | POA: Diagnosis not present

## 2022-01-31 DIAGNOSIS — K219 Gastro-esophageal reflux disease without esophagitis: Secondary | ICD-10-CM | POA: Diagnosis not present

## 2022-01-31 DIAGNOSIS — R7401 Elevation of levels of liver transaminase levels: Secondary | ICD-10-CM | POA: Diagnosis not present

## 2022-01-31 DIAGNOSIS — Z9181 History of falling: Secondary | ICD-10-CM | POA: Diagnosis not present

## 2022-01-31 DIAGNOSIS — K5730 Diverticulosis of large intestine without perforation or abscess without bleeding: Secondary | ICD-10-CM | POA: Diagnosis not present

## 2022-01-31 DIAGNOSIS — N281 Cyst of kidney, acquired: Secondary | ICD-10-CM | POA: Diagnosis not present

## 2022-01-31 DIAGNOSIS — G9341 Metabolic encephalopathy: Secondary | ICD-10-CM | POA: Diagnosis not present

## 2022-01-31 DIAGNOSIS — Z85030 Personal history of malignant carcinoid tumor of large intestine: Secondary | ICD-10-CM | POA: Diagnosis not present

## 2022-01-31 DIAGNOSIS — Z9049 Acquired absence of other specified parts of digestive tract: Secondary | ICD-10-CM | POA: Diagnosis not present

## 2022-01-31 DIAGNOSIS — R5381 Other malaise: Secondary | ICD-10-CM | POA: Diagnosis not present

## 2022-01-31 DIAGNOSIS — G20 Parkinson's disease: Secondary | ICD-10-CM | POA: Diagnosis not present

## 2022-01-31 NOTE — Telephone Encounter (Signed)
The following message was left with [employer] on 01/29/22 at 1:38 PM. ? ?Caller states her husband was admitted to the hospital Thurdsay. 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 He doesn't know their son. He has had tests done but caller wants an MRI ordered as well. ? ?Caller advised by nurse to communicate with the hospitalist currently overseeing patient's care at this time. Caller verbalized understanding. ? ?See detailed report in doctor's inbox for more details. ?

## 2022-01-31 NOTE — Telephone Encounter (Signed)
Pt was in hospital at time of phone call.  Nothing further to do. ?

## 2022-02-01 ENCOUNTER — Telehealth: Payer: Self-pay

## 2022-02-01 LAB — CULTURE, BLOOD (ROUTINE X 2)
Culture: NO GROWTH
Culture: NO GROWTH
Special Requests: ADEQUATE
Special Requests: ADEQUATE

## 2022-02-01 NOTE — Progress Notes (Deleted)
Assessment/Plan:   1.  Parkinsons Disease  -His genetic testing was negative.  -continue carbidopa/levodopa 25/100 tid  -he has facial dyskinesia.  We discussed amantadine along with r/b/se.  We will start with 100 mg bid with first 2 dosages of levodopa.  They will let me know if causes confusion.    -We discussed that it used to be thought that levodopa would increase risk of melanoma but now it is believed that Parkinsons itself likely increases risk of melanoma. he is to get regular skin checks.  He is following regularly with dermatology.  -We will send a referral to physical therapy.  He is otherwise doing a great job with exercise.  2.  Memory change  -Neurocognitive testing in April, 2021 was completely normal in April, 2021, but wife expresses continued concerns.  This is part of the reason they need to watch amantadine closely.  3.  Depression  -on sertraline, 50 mg daily.     4.   RBD  -following with Dr. Maxwell Caul  -On melatonin, 10 mg nightly. 5.  B12 deficiency  -pt with evidence of B12 deficiency.  Discussed with the patient that neurologically, would like to see B12 levels greater than 400.  Would recommend oral B12 supplementation, 1000 mcg daily.  He is on this, and they state that Dr. Brigitte Pulse recently gave him injection as well.  6.  RLS  -will consider adding carbidopa/levodopa 50/200 at bedtime but decided to make a f/u in 6-8 weeks to evaluate that since I do not want to start 2 medications at once.    Subjective:   Barry Pope was seen today in follow up for Parkinsons disease.  My previous records were reviewed prior to todays visit as well as outside records available to me. Noting little more tremor.  Little more trouble with fine motor skills. Pt denies falls.  Pt denies lightheadedness, near syncope.  No hallucinations.  Mood has been fair and finding himself more frustrated.  Continues to exercise regularly.  Following with dermatology.  Last visit, B12  deficiency identified.  Oral B12 recommended.  Patient reports that he is doing that.  He notes some RLS.  He notes some pulling in the face  Current prescribed movement disorder medications: carbidopa/levodopa 25/100, 7am/11am/4pm B12  PREVIOUS MEDICATIONS: none to date  ALLERGIES:  No Known Allergies  CURRENT MEDICATIONS:  Outpatient Encounter Medications as of 02/03/2022  Medication Sig   acyclovir (ZOVIRAX) 200 MG capsule 1 capusle Orally four times a day as needed (Patient taking differently: Take 200 mg by mouth 4 (four) times daily as needed (flare up).)   carbidopa-levodopa (SINEMET IR) 25-100 MG tablet Take 1 tablet by mouth 3 (three) times daily. (Patient taking differently: Take 1 tablet by mouth See admin instructions. Three times daily - 1 tablet upon wakening, 1 tablet 1100, and 1 tablet 1600)   cholecalciferol (VITAMIN D3) 25 MCG (1000 UNIT) tablet Take 1,000 Units by mouth daily.   doxycycline (VIBRAMYCIN) 50 MG capsule Take 1 capsule (50 mg total) by mouth daily as needed for rosacea. (Patient taking differently: Take 50 mg by mouth every other day.)   ibuprofen (ADVIL) 200 MG tablet Take 400 mg by mouth every 6 (six) hours as needed for mild pain.   MELATONIN PO Take 1 tablet by mouth at bedtime.   omeprazole (PRILOSEC) 20 MG capsule Take 1 capsule (20 mg total) by mouth daily.   sertraline (ZOLOFT) 50 MG tablet Take 1 tablet (50 mg total)  by mouth every evening. (Patient taking differently: Take 50 mg by mouth at bedtime.)   tamsulosin (FLOMAX) 0.4 MG CAPS capsule 1 capsule Orally Once a day in the evening for urinary frequency 30 day(s) (Patient taking differently: Take 0.4 mg by mouth at bedtime.)   traZODone (DESYREL) 50 MG tablet TAKE 1/2-1 TABLET BY MOUTH AT BEDTIME AS NEEDED (Patient taking differently: Take 25-50 mg by mouth at bedtime.)   No facility-administered encounter medications on file as of 02/03/2022.    Objective:   PHYSICAL EXAMINATION:    VITALS:    There were no vitals filed for this visit.     GEN:  The patient appears stated age and is in NAD. HEENT:  Normocephalic, atraumatic.  The mucous membranes are moist. The superficial temporal arteries are without ropiness or tenderness. CV:  RRR Lungs:  CTAB Neck/HEME:  There are no carotid bruits bilaterally.  Neurological examination:  Orientation: The patient is alert and oriented x3. Cranial nerves: There is good facial symmetry with mild facial hypomimia. The speech is fluent and clear. Soft palate rises symmetrically and there is no tongue deviation. Hearing is intact to conversational tone. Sensation: Sensation is intact to light touch throughout Motor: Strength is at least antigravity x4.  Movement examination: Tone: There is  normal tone in the upper and lower extremities. Abnormal movements:  there is facial dyskinesia Coordination:  There is min decremation on the R Gait and Station: The patient has no difficulty arising out of a deep-seated chair without the use of the hands. The patient's stride length is good with purposeful arm swing bilaterally.  He has some start hesitation  I have reviewed and interpreted the following labs independently   Lab Results  Component Value Date   TSH 3.609 01/28/2022   Lab Results  Component Value Date   VITAMINB12 216 06/15/2021     Total time spent on today's visit was 32 minutes, including both face-to-face time and nonface-to-face time.  Time included that spent on review of records (prior notes available to me/labs/imaging if pertinent), discussing treatment and goals, answering patient's questions and coordinating care.  Cc:  Mayra Neer, MD

## 2022-02-01 NOTE — Telephone Encounter (Signed)
How have you been since you were released from the hospital?  Feeling okay ? ?Do you understand why you were in the hospital? Yes  ? ?Do you understanding the discharge instructions? Yes and discharge summary was review ? ?Items reviewed: ?Medications:Carbidopa-levodopa 25-'100mg'$  1 tab tid ?Allergies:NKA ?Dietary changes:Regular food ?Referrals reviewed: Home Health for OT and PT ? ?Functional questionare: ?ADLs (independent/dependent) Dependent ? Ambulation? Yes with Assistance ? Bathing/hygiene? Dependent ? Feeding/Food prep?Dependent ? Continence? Yes, Bladder ? Grooming/dressing? Dependent ? ?Transportation ?Any patient concerns? Mental confusion, slow processing thoughts, will discuss on follow up ? ?Confirmed appointments and set up follow up with Dr. Wells Guiles Tat at [facility] Friday April 28,2023 at 1:30pm. ? ?Confirmed with patient if condition begins to worsen call PCP or go to the ER. Pt. Was given the office number and encouraged to call back with questions or concerns.   ?

## 2022-02-02 ENCOUNTER — Ambulatory Visit: Payer: Medicare Other | Admitting: Physical Therapy

## 2022-02-02 DIAGNOSIS — G20 Parkinson's disease: Secondary | ICD-10-CM | POA: Diagnosis not present

## 2022-02-02 DIAGNOSIS — R748 Abnormal levels of other serum enzymes: Secondary | ICD-10-CM | POA: Diagnosis not present

## 2022-02-02 DIAGNOSIS — E871 Hypo-osmolality and hyponatremia: Secondary | ICD-10-CM | POA: Diagnosis not present

## 2022-02-02 DIAGNOSIS — R5383 Other fatigue: Secondary | ICD-10-CM | POA: Diagnosis not present

## 2022-02-02 DIAGNOSIS — E538 Deficiency of other specified B group vitamins: Secondary | ICD-10-CM | POA: Diagnosis not present

## 2022-02-02 DIAGNOSIS — D696 Thrombocytopenia, unspecified: Secondary | ICD-10-CM | POA: Diagnosis not present

## 2022-02-03 ENCOUNTER — Ambulatory Visit: Payer: Medicare Other | Admitting: Neurology

## 2022-02-03 NOTE — Progress Notes (Signed)
? ? ?Assessment/Plan:  ? ?1.  Parkinsons Disease ? -His genetic testing was negative. ? -continue carbidopa/levodopa 25/100 tid ? -We discussed that it used to be thought that levodopa would increase risk of melanoma but now it is believed that Parkinsons itself likely increases risk of melanoma. he is to get regular skin checks.  He is following regularly with dermatology. ? ? ?2.  Memory change ? 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  Discussed neurocognitive testing, but ultimately they decided to hold on that.  Did discuss our extensive wait for that if they decided to change their mind. ? ?3.  Depression ? -on sertraline, 50 mg daily.    ? ?4.   RBD and insomnia ? -following with Dr. Maxwell Caul ? -On melatonin, 10 mg nightly. ? -They state that they were told to ask me about more sleep medication.  He is already taking trazodone, low-dose, without success.  I really do not want to add anything further given the state of his liver enzymes.  They do not disagree.  We did discuss sleep hygiene extensively and the timing of his naps. ? ?5.  B12 deficiency ? -On supplementation ? ?6.  RLS ? -will consider adding carbidopa/levodopa 50/200 at bedtime but am not making that change right now given liver enzymes ? ?7.  Confusion/MS change ? -Amantadine has been discontinued, but I am not convinced at all that his mental status change was from Parkinson's or even related to Parkinson's.  He presented to the hospital with fever, hyponatremia and significantly elevated liver enzymes which continue to climb.  Nonetheless, mental status has markedly improved per patient and family.  His work-up thus far has been negative by GI.  He is awaiting an appointment at the liver clinic.   ? ? ? ?Subjective:  ? ?Barry Pope was seen today in follow up for hospital f/u/TCM visit (but see PCP did TCM visit already so did regular f/u).  My previous  records were reviewed prior to todays visit as well as outside records available to me.  Wife supplements hx.  Hospital records reviewed.  Started amantadine last visit, which was only March 9.  His wife called [place] on April 20 stating that the patient was confused and mumbling his words and wondered if he was having a stroke.  We told them to go to the emergency room.  They ended up going to the primary care physician and were screened for urinary tract infection and that was negative and was sent to the emergency room.  In the emergency room, he was found to have a fever of 101.  He was hyponatremic to 129.  He was treated with normal saline in the hospital and this resolved.  Liver function tests were elevated with AST 199 and ALT 137 and alkaline phosphatase 140.  Gastroenterology was consulted.  Liver work-up was largely negative.  Amantadine was stopped in the hospital.  GI felt that his ongoing confusion was related to Parkinson's disease.  He followed up with his primary care physician earlier this week.  Liver enzymes were rechecked and were worse.  ALT was 228, AST 314, alkaline phosphatase 419.  He was referred to the liver clinic.  Wife state that pt was very confused in the hospital but he has trouble processing his thoughts - pt states the only problem is when I am "under pressure."  Wife and patient admit that  he is markedly better than when he was in the hospital, but not quite to baseline in terms of cognitive status, but nearly.  He is not driving.  Pt is not sleeping.  Wondering at night and they ask about sleep meds.   ? ?Current prescribed movement disorder medications: ?carbidopa/levodopa 25/100, 7am/11am/4pm ?B12 ? ?PREVIOUS MEDICATIONS: Amantadine (stopped when he was in the hospital for confusion, but he had a lot of reasons to have confusion at that time, including hyponatremia, significantly elevated liver enzymes, fever) ? ?ALLERGIES:  No Known Allergies ? ?CURRENT MEDICATIONS:  ?Outpatient  Encounter Medications as of 02/04/2022  ?Medication Sig  ? acyclovir (ZOVIRAX) 200 MG capsule 1 capusle Orally four times a day as needed (Patient taking differently: Take 200 mg by mouth 4 (four) times daily as needed (flare up).)  ? carbidopa-levodopa (SINEMET IR) 25-100 MG tablet Take 1 tablet by mouth 3 (three) times daily. (Patient taking differently: Take 1 tablet by mouth See admin instructions. Three times daily - 1 tablet upon wakening, 1 tablet 1100, and 1 tablet 1600)  ? cholecalciferol (VITAMIN D3) 25 MCG (1000 UNIT) tablet Take 1,000 Units by mouth daily.  ? doxycycline (VIBRAMYCIN) 50 MG capsule Take 1 capsule (50 mg total) by mouth daily as needed for rosacea. (Patient taking differently: Take 50 mg by mouth every other day.)  ? ibuprofen (ADVIL) 200 MG tablet Take 400 mg by mouth every 6 (six) hours as needed for mild pain.  ? MELATONIN PO Take 1 tablet by mouth at bedtime.  ? omeprazole (PRILOSEC) 20 MG capsule Take 1 capsule (20 mg total) by mouth daily.  ? sertraline (ZOLOFT) 50 MG tablet Take 1 tablet (50 mg total) by mouth every evening. (Patient taking differently: Take 50 mg by mouth at bedtime.)  ? tamsulosin (FLOMAX) 0.4 MG CAPS capsule 1 capsule Orally Once a day in the evening for urinary frequency 30 day(s) (Patient taking differently: Take 0.4 mg by mouth at bedtime.)  ? traZODone (DESYREL) 50 MG tablet TAKE 1/2-1 TABLET BY MOUTH AT BEDTIME AS NEEDED (Patient taking differently: Take 25-50 mg by mouth at bedtime.)  ? ?No facility-administered encounter medications on file as of 02/04/2022.  ? ? ?Objective:  ? ?PHYSICAL EXAMINATION:   ? ?VITALS:   ?Vitals:  ? 02/04/22 1330  ?BP: 123/78  ?Pulse: 71  ?SpO2: 96%  ?Weight: 157 lb 12.8 oz (71.6 kg)  ?Height: '5\' 9"'$  (1.753 m)  ? ? ? ? ? ?GEN:  The patient appears stated age and is in NAD. ?HEENT:  Normocephalic, atraumatic.  The mucous membranes are moist. The superficial temporal arteries are without ropiness or tenderness. ?CV:  RRR ?Lungs:   CTAB ?Neck/HEME:  There are no carotid bruits bilaterally. ? ?Neurological examination: ? ?Orientation: The patient is alert and oriented x3. ?Cranial nerves: There is good facial symmetry with mild facial hypomimia. The speech is fluent and clear.  He is hypophonic.  Soft palate rises symmetrically and there is no tongue deviation. Hearing is intact to conversational tone. ?Sensation: Sensation is intact to light touch throughout ?Motor: Strength is at least antigravity x4. ? ?Movement examination: ?Tone: There is  normal tone in the upper and lower extremities. 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  There is no asterixis. ?Coordination:  There is normal rapid alternating movements today, with the exception of mild decreased toe taps on the right. ?Gait and Station: The patient has no difficulty arising out of  a deep-seated chair without the use of the hands. The patient's stride length is good with good arm swing bilaterally.  He has some minimal start hesitation ? ?I have reviewed and interpreted the following labs independently ? ? ?Lab Results  ?Component Value Date  ? TSH 3.609 01/28/2022  ? ?Lab Results  ?Component Value Date  ? [id number] 216 06/15/2021  ? ? ? ?Total time spent on today's visit was 45 minutes, including both face-to-face time and nonface-to-face time.  Time included that spent on review of records (prior notes available to me/labs/imaging if pertinent), discussing treatment and goals, answering patient's questions and coordinating care. ? ?Cc:  Mayra Neer, MD ?

## 2022-02-04 ENCOUNTER — Ambulatory Visit: Payer: Medicare Other | Admitting: Neurology

## 2022-02-04 ENCOUNTER — Encounter: Payer: Self-pay | Admitting: Neurology

## 2022-02-04 VITALS — BP 123/78 | HR 71 | Ht 69.0 in | Wt 157.8 lb

## 2022-02-04 DIAGNOSIS — G249 Dystonia, unspecified: Secondary | ICD-10-CM | POA: Diagnosis not present

## 2022-02-04 DIAGNOSIS — G20 Parkinson's disease: Secondary | ICD-10-CM | POA: Diagnosis not present

## 2022-02-04 DIAGNOSIS — R413 Other amnesia: Secondary | ICD-10-CM

## 2022-02-04 NOTE — Patient Instructions (Signed)
It was so good to see you today!  From a Parkinsons Disease standpoint, you look pretty good! ?I am worried about your liver enzymes and am glad that Dr. Brigitte Pulse referred you to the liver clinic.  Please keep me updated! ?We decided to hold off on cognitive testing for now ?Work hard in Physical therapy! ?Local and Online Resources for Power over Parkinson's Group ?April 2023 ? ?LOCAL [place] PARKINSON'S GROUPS  ?Power over Parkinson's Group:   ?Power Over Parkinson's Patient Education Group will be Wednesday, April 12th-*Hybrid meting*- in person at [facility] location and via [facility] at 2:00 pm.   ?Upcoming Power over Parkinson's Meetings:  2nd Wednesdays of the month at 2 pm:  April 12th, May 10th ?Contact Amy Marriott at [email or web address] if interested in participating in this group ?Parkinson's Care Partners Group:    3rd Mondays, Contact Misty Paladino ?Atypical Parkinsonian Patient Group:   4th Wednesdays, Contact Misty Paladino ?If you are interested in participating in these groups with Misty, please contact her directly for how to join those meetings.  Her contact information is [email or web address].   ? ?LOCAL EVENTS AND NEW OFFERINGS ?Dine out at [employer].  Celebrate Parkinson's disease Awareness Month and Support the Parkinson's Movement Disorder Fund.   Wednesday, April 19th 4-6 pm at [place], [employer].  (Give receipt to cashier and 20% will be donated) ?[place]!  Play [place]!  Join [place] for home game for a fun evening to bring awareness of Parkinson's and raise funds for our Movement Disorder Funds. April 28th 6:30 pm [address]. To purchase tickets:  [email or web address] ?Parkinson's T-shirts for sale!  Designed by a local group member, with funds going to [place].  $20.00  Contact Misty to purchase (see email above) ?New PWR! Moves [employer] Instructor-Led Class offering at  [employer]! Starting Wednesdays 1-2 pm, starting April 12th.   Contact Bryson Dames, Acupuncturist at [employer].  [email or web address] [email or web address] ? ?ONLINE EDUCATION AND SUPPORT ?[place]:  www.parkinson.org ?PD Health at Home continues:  Mindfulness Mondays, Expert Briefing Tuesdays, Wellness Wednesdays, Take Time Thursdays, Fitness Fridays  ?Upcoming Education:  ?Freezing and Fall Prevention in Parkinson's.  Wednesday, April 12th at 1:00 pm ?Understanding Gene and Cell-Based Therapies in Parkinson's.  Wednesday, May 10th at 1:00 pm ?Register for expert briefings (webinars) at [email or web address] ?Please check out their website to sign up for emails and see their full online offerings ? ? ?[place]:  [email or web address]  ?Third Thursday Webinars:  On the third Thursday of every month at 12 p.m. ET, join our free live webinars to learn about various aspects of living with Parkinson's disease and our work to speed medical breakthroughs. ?Upcoming Webinar:  Brainwaves:  The Potential of Focused Ultrasound and Deep Brain Stimulation in PD.  Thursday, April 20th at  12 noon. ?Check out additional information on their website to see their full online offerings ? ?[place]:  www.[email or web address] ?Upcoming Webinar:   Stay tuned ?Webinar Series:  Living with Parkinson's Meetup.   Third Thursdays each month, 3 pm ?Care Partner Monthly Meetup.  With Robin Searing Phinney.  First Tuesday of each month, 2 pm ?Check out additional information to Live Well Today on their website ? ?Parkinson and Movement Disorders (PMD) Alliance:  www.pmdalliance.org ?[employer] Online:  Online Education Events ?Sign up for emails, which are sent weekly to give you updates on programming and online offerings ? ?Parkinson's Association of the [place]:  www.parkinsonassociation.org ?Information on online support groups,  education events,  and online exercises including Yoga, Parkinson's exercises and more-LOTS of information on links to PD resources and online events ?Virtual Support Group through [employer] of the [place]; next one is scheduled for Wednesday, April 5th at 2 pm. (These are typically scheduled for the 1st Wednesday of the month at 2 pm).  Visit website for details. ?MOVEMENT AND EXERCISE OPPORTUNITIES ?Parkinson's DRUMMING Classes/Music Therapy with Doylene Canning:  This is a returning class and it's FREE!  2nd Mondays, continuing April 10th  , 11:00 at the [place].  Contact *Misty Taylor-Paladino at [email or web address] or Doylene Canning at [phone] or [email or web address]  ?PWR! Moves Classes at [place].  Wednesdays 10 and 11 am.   Contact Amy Marriott, PT [email or web address] if interested. ?NEW PWR! Moves Class offering at [employer].  Wednesdays 1-2 pm, starting April 12th.  Contact Bryson Dames, Acupuncturist at [employer].  [email or web address] [email or web address] ?Here is a link to the PWR!Moves classes on Zoom from [place] - Daily Mon-Sat at 10:00. Via Zoom, FREE and open to all.  There is also a link below via Facebook if you use that platform. ? ?[email or web address].si ?https://www.PrepaidParty.no ? ?Parkinson's Wellness Recovery (PWR! Moves)  www.pwr4life.org ?Info on the PWR! Virtual Experience:  You will have access to our expertise through self-assessment, guided plans that start with the PD-specific fundamentals, educational content, tips, Q&A with an expert, and a growing Art therapist of PD-specific pre-recorded and live exercise classes of varying types and intensity - both physical and cognitive! If that is not  enough, we offer 1:1 wellness consultations (in-person or virtual) to personalize your PWR! Research scientist (medical).  ?[employer] Fridays:  ?As part of the PD Health @ Home program, this free video series focuses each week on one aspect of fitness designed to support people living with Parkinson's.  These weekly videos highlight the [place] recent fitness guidelines for people with Parkinson's disease. ?www.KVTVnet.com.cy ?Dance for PD website is offering free, live-stream classes throughout the week, as well as links to [employer] of classes:  https://danceforparkinsons.org/ ?Dance for Parkinson's in-person class.  February 1-April 26, Wednesdays 4-5 pm.  Free class for people with Parkinson's disease, at [address], [place], [place].  Contact [phone] or [email or web address] .org to register ?Virtual dance and Pilates for Parkinson's classes: Click on the Community Tab> Parkinson's Movement Initiative Tab.  To register for classes and for more information, visit www.SeekAlumni.co.za and click the ?community? tab.  ?[employer] Parkinson's Cycling Classes  ?[employer]:  Thursdays @ Noon-Live classes at [employer] ([employer] at [email or web address] or ([phone]) ?[employer]: Virtual Classes Mondays and Thursdays Jeanette Caprice classes Tuesday, Wednesday and Thursday (contact [place] at [email or web address] .[email or web address]  or [phone]) ?[employer] ?Varied levels of classes are offered Mondays, Tuesdays and Thursdays at [employer].  ?Stretching with Verdis Frederickson weekly class is also offered for people with Parkinson's ?To observe a class or for more information, call [phone] or email Hezzie Bump at info'[email or web address] .[email or web address] ?ADDITIONAL SUPPORT AND RESOURCES ?[employer]:Online Caregiver Education Opportunities:   www.well-springsolutions.org/caregiver-education/caregiver-support-group.  You may also contact Vickki Muff at [email or web address] or [phone].    ?Well-Spring Navigator:  Just1Navigator program, a free service to help individuals and families thro

## 2022-02-07 ENCOUNTER — Other Ambulatory Visit (HOSPITAL_BASED_OUTPATIENT_CLINIC_OR_DEPARTMENT_OTHER): Payer: Self-pay

## 2022-02-10 DIAGNOSIS — K760 Fatty (change of) liver, not elsewhere classified: Secondary | ICD-10-CM | POA: Diagnosis not present

## 2022-02-10 DIAGNOSIS — R7401 Elevation of levels of liver transaminase levels: Secondary | ICD-10-CM | POA: Diagnosis not present

## 2022-02-11 ENCOUNTER — Other Ambulatory Visit (HOSPITAL_BASED_OUTPATIENT_CLINIC_OR_DEPARTMENT_OTHER): Payer: Self-pay

## 2022-02-11 MED ORDER — SERTRALINE HCL 50 MG PO TABS
ORAL_TABLET | ORAL | 11 refills | Status: DC
  Filled 2022-02-11: qty 30, 30d supply, fill #0
  Filled 2022-03-13: qty 30, 30d supply, fill #1
  Filled 2022-04-10: qty 30, 30d supply, fill #2
  Filled 2022-05-04: qty 30, 30d supply, fill #3
  Filled 2022-06-07: qty 30, 30d supply, fill #4
  Filled 2022-07-06: qty 30, 30d supply, fill #5
  Filled 2022-08-07: qty 30, 30d supply, fill #6
  Filled 2022-09-13: qty 30, 30d supply, fill #7
  Filled 2022-10-08 – 2022-10-13 (×2): qty 30, 30d supply, fill #8
  Filled 2022-11-15: qty 30, 30d supply, fill #9
  Filled 2022-12-12: qty 30, 30d supply, fill #10

## 2022-02-28 ENCOUNTER — Other Ambulatory Visit (HOSPITAL_BASED_OUTPATIENT_CLINIC_OR_DEPARTMENT_OTHER): Payer: Self-pay

## 2022-03-02 ENCOUNTER — Other Ambulatory Visit (HOSPITAL_BASED_OUTPATIENT_CLINIC_OR_DEPARTMENT_OTHER): Payer: Self-pay

## 2022-03-08 DIAGNOSIS — B178 Other specified acute viral hepatitis: Secondary | ICD-10-CM | POA: Diagnosis not present

## 2022-03-08 DIAGNOSIS — K760 Fatty (change of) liver, not elsewhere classified: Secondary | ICD-10-CM | POA: Diagnosis not present

## 2022-03-08 DIAGNOSIS — B2709 Gammaherpesviral mononucleosis with other complications: Secondary | ICD-10-CM | POA: Diagnosis not present

## 2022-03-14 ENCOUNTER — Other Ambulatory Visit (HOSPITAL_BASED_OUTPATIENT_CLINIC_OR_DEPARTMENT_OTHER): Payer: Self-pay

## 2022-03-16 ENCOUNTER — Other Ambulatory Visit (HOSPITAL_BASED_OUTPATIENT_CLINIC_OR_DEPARTMENT_OTHER): Payer: Self-pay

## 2022-03-23 ENCOUNTER — Other Ambulatory Visit (HOSPITAL_BASED_OUTPATIENT_CLINIC_OR_DEPARTMENT_OTHER): Payer: Self-pay

## 2022-04-04 ENCOUNTER — Other Ambulatory Visit (HOSPITAL_BASED_OUTPATIENT_CLINIC_OR_DEPARTMENT_OTHER): Payer: Self-pay

## 2022-04-11 ENCOUNTER — Other Ambulatory Visit (HOSPITAL_BASED_OUTPATIENT_CLINIC_OR_DEPARTMENT_OTHER): Payer: Self-pay

## 2022-04-20 ENCOUNTER — Ambulatory Visit: Payer: Medicare Other | Admitting: Orthopedic Surgery

## 2022-04-20 ENCOUNTER — Ambulatory Visit (INDEPENDENT_AMBULATORY_CARE_PROVIDER_SITE_OTHER): Payer: Medicare Other

## 2022-04-20 DIAGNOSIS — M545 Low back pain, unspecified: Secondary | ICD-10-CM

## 2022-04-23 ENCOUNTER — Encounter: Payer: Self-pay | Admitting: Orthopedic Surgery

## 2022-04-23 NOTE — Progress Notes (Signed)
Office Visit Note   Patient: Barry Pope           Date of Birth: 01-09-1946           MRN: [id number] Visit Date: 04/20/2022 Requested by: Mayra Neer, MD [address] [place],  [place] [address] PCP: Mayra Neer, MD  Subjective: Chief Complaint  Patient presents with   Lower Back - Pain    HPI: Barry Pope is a 76 year old patient with back pain.  Pain starts across his low back but mostly affects the right side into the gluteal region.  Been going on for 2 to 3 months but it is getting worse.  Denies any history of injury.  Reports increased pain with activity.  Does have some relief after sitting for a while.  Denies much in the way of leg pain and also does not report any numbness and tingling in his legs.  No prior back surgery.  He has been diagnosed with Parkinson's since his last visit.  That does limit his standing endurance.  He likes to exercise about 5 days a week.  The pain is not sharp but it is constant.  Medications have not given him minimal relief.  He is okay with sleep and lying down helps his symptoms.              ROS: All systems reviewed are negative as they relate to the chief complaint within the history of present illness.  Patient denies  fevers or chills.   Assessment & Plan: Visit Diagnoses:  1. Low back pain, unspecified back pain laterality, unspecified chronicity, unspecified whether sciatica present     Plan: Impression is low back pain with significant arthritis on plain radiographs.  I think Remus would be a good candidate for epidural steroid injections.  Does not really behave by history like spinal stenosis but that is a consideration as well.  Plan is MRI lumbar spine to evaluate spinal stenosis.  He exercises 5 days a week which is essentially physical therapy and that is not helping.  Follow-Up Instructions: Return for after MRI.   Orders:  Orders Placed This Encounter  Procedures   XR Lumbar Spine 2-3 Views   MR  Lumbar Spine w/o contrast   No orders of the defined types were placed in this encounter.     Procedures: No procedures performed   Clinical Data: No additional findings.  Objective: Vital Signs: There were no vitals taken for this visit.  Physical Exam:   Constitutional: Patient appears well-developed HEENT:  Head: Normocephalic Eyes:EOM are normal Neck: Normal range of motion Cardiovascular: Normal rate Pulmonary/chest: Effort normal Neurologic: Patient is alert Skin: Skin is warm Psychiatric: Patient has normal mood and affect   Ortho Exam: Ortho exam demonstrates full active and passive range of motion of the knees and hips.  Mild pain with forward flexion.  Reflexes symmetric 0 to 1+ out of 4 bilateral patella and Achilles.  Pedal pulses palpable.  No paresthesias L1-S1 bilaterally.  No trochanteric tenderness is noted.  No other masses lymphadenopathy or skin changes noted in that back region.  Specialty Comments:  No specialty comments available.  Imaging: No results found.   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   Past Medical History:  Diagnosis Date   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   Allergic rhinitis    Dysphonia 08/27/2019   Gastroesophageal reflux disease 03/26/2019   History of malignant carcinoid tumor of large intestine    History of malignant carcinoid tumor of small  intestine    Insomnia    Knee osteoarthritis    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   Male erectile dysfunction, unspecified    Parkinson's disease 12/09/2019   Rosacea, unspecified    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   Vocal fold atrophy     Family History  Problem Relation Age of Onset   Pulmonary fibrosis Mother    Heart attack Father    Alcoholism Father    Parkinson's disease Father    CAD Father    Lung cancer Sister     Past Surgical History:  Procedure Laterality Date   APPENDECTOMY     COLONOSCOPY  2004/2009/2015   INGUINAL HERNIA REPAIR Right    KNEE SURGERY     PARTIAL COLECTOMY     Social History   Occupational History   Occupation: Retired    Comment: IT  Tobacco Use   Smoking status: Never   Smokeless tobacco: Never  Vaping Use   Vaping Use: Never used  Substance and Sexual Activity   Alcohol use: Yes    Alcohol/week: 14.0 - 21.0 standard drinks of alcohol    Types: 14 - 21 Standard drinks or equivalent per week    Comment: Couple ounces of liquor per night   Drug use: Not Currently    Types: Marijuana    Comment: Very rare "gummy" marijuana consumption   Sexual activity: Not on file

## 2022-04-24 ENCOUNTER — Ambulatory Visit
Admission: RE | Admit: 2022-04-24 | Discharge: 2022-04-24 | Disposition: A | Discharge status: Home / Self Care | Payer: Medicare Other | Source: Ambulatory Visit | Attending: Orthopedic Surgery | Admitting: Orthopedic Surgery

## 2022-04-24 DIAGNOSIS — M545 Low back pain, unspecified: Secondary | ICD-10-CM

## 2022-04-26 NOTE — Progress Notes (Signed)
Hi Lauren.  I called [facility]on about his MRI scan.  He does not need follow-up appointment with [place].  Would you mind sending him on to Dr. Ernestina Patches for lumbar spine ESI x2.  Thanks

## 2022-04-27 ENCOUNTER — Other Ambulatory Visit: Payer: Self-pay

## 2022-04-27 DIAGNOSIS — M545 Low back pain, unspecified: Secondary | ICD-10-CM

## 2022-04-28 ENCOUNTER — Telehealth: Payer: Self-pay | Admitting: Physical Medicine and Rehabilitation

## 2022-04-28 NOTE — Telephone Encounter (Signed)
Patient returned call asked for a call back   [phone]

## 2022-04-29 ENCOUNTER — Telehealth: Payer: Self-pay | Admitting: Physical Medicine and Rehabilitation

## 2022-04-29 NOTE — Telephone Encounter (Signed)
Patient called. Returning a call to [facility]

## 2022-05-01 ENCOUNTER — Other Ambulatory Visit (HOSPITAL_BASED_OUTPATIENT_CLINIC_OR_DEPARTMENT_OTHER): Payer: Self-pay

## 2022-05-02 ENCOUNTER — Other Ambulatory Visit (HOSPITAL_BASED_OUTPATIENT_CLINIC_OR_DEPARTMENT_OTHER): Payer: Self-pay

## 2022-05-02 MED ORDER — ACYCLOVIR 200 MG PO CAPS
ORAL_CAPSULE | ORAL | 2 refills | Status: DC
  Filled 2022-05-02: qty 30, 7d supply, fill #0
  Filled 2022-10-08 – 2022-10-13 (×2): qty 30, 7d supply, fill #1

## 2022-05-05 ENCOUNTER — Other Ambulatory Visit (HOSPITAL_BASED_OUTPATIENT_CLINIC_OR_DEPARTMENT_OTHER): Payer: Self-pay

## 2022-05-19 ENCOUNTER — Encounter: Payer: Self-pay | Admitting: Physical Medicine and Rehabilitation

## 2022-05-19 ENCOUNTER — Ambulatory Visit: Payer: Medicare Other | Admitting: Physical Medicine and Rehabilitation

## 2022-05-19 ENCOUNTER — Ambulatory Visit: Payer: Self-pay

## 2022-05-19 VITALS — BP 158/77 | HR 75

## 2022-05-19 DIAGNOSIS — M5416 Radiculopathy, lumbar region: Secondary | ICD-10-CM

## 2022-05-19 MED ORDER — METHYLPREDNISOLONE ACETATE 80 MG/ML IJ SUSP
80.0000 mg | Freq: Once | INTRAMUSCULAR | Status: AC
  Administered 2022-05-19: 80 mg

## 2022-05-19 NOTE — Progress Notes (Signed)
Pt state mid back pain. Pt state bending and cleaning house makes the pain worse. Pt state he not able to take anything for the pain.  Numeric Pain Rating Scale and Functional Assessment Average Pain 3   In the last MONTH (on 0-10 scale) has pain interfered with the following?  1. General activity like being  able to carry out your everyday physical activities such as walking, climbing stairs, carrying groceries, or moving a chair?  Rating(7)   +Driver, -BT, -Dye Allergies.

## 2022-05-19 NOTE — Patient Instructions (Signed)

## 2022-05-20 ENCOUNTER — Ambulatory Visit: Payer: Medicare Other | Admitting: Orthopedic Surgery

## 2022-05-23 ENCOUNTER — Other Ambulatory Visit (HOSPITAL_BASED_OUTPATIENT_CLINIC_OR_DEPARTMENT_OTHER): Payer: Self-pay

## 2022-05-23 MED ORDER — AREXVY 120 MCG/0.5ML IM SUSR
INTRAMUSCULAR | 0 refills | Status: DC
  Filled 2022-05-23: qty 1, 1d supply, fill #0

## 2022-05-25 ENCOUNTER — Encounter: Payer: Self-pay | Admitting: Physical Therapy

## 2022-05-25 NOTE — Therapy (Signed)
[facility] [address], [place], [place], [address] Phone: [phone]   Fax:  [phone]  Patient Details  Name: Barry Pope MRN: [id number] Date of Birth: 09/30/1946 Referring Provider:  No ref. provider found  Encounter Date: 05/25/2022  PHYSICAL THERAPY DISCHARGE SUMMARY  Visits from Start of Care: 4  Current functional level related to goals / functional outcomes: See PT note-not able to fully assess goals, as pt did not return to PT   Remaining deficits: Balance, posture   Education / Equipment: Initial education in HEP   Patient agrees to discharge. Patient goals were not met. Patient is being discharged due to not returning since the last visit.   Messi Twedt W., PT 05/25/2022, 8:15 AM  [facility] [address] [place], [place], [address] Phone: [phone]   Fax:  [phone]

## 2022-05-26 ENCOUNTER — Other Ambulatory Visit: Payer: Self-pay | Admitting: Neurology

## 2022-05-27 ENCOUNTER — Other Ambulatory Visit (HOSPITAL_BASED_OUTPATIENT_CLINIC_OR_DEPARTMENT_OTHER): Payer: Self-pay

## 2022-05-27 MED ORDER — CARBIDOPA-LEVODOPA 25-100 MG PO TABS
1.0000 | ORAL_TABLET | Freq: Three times a day (TID) | ORAL | 1 refills | Status: DC
  Filled 2022-05-27: qty 270, 90d supply, fill #0
  Filled 2022-08-07: qty 270, 90d supply, fill #1

## 2022-05-28 NOTE — Procedures (Signed)
Lumbosacral Transforaminal Epidural Steroid Injection - Sub-Pedicular Approach with Fluoroscopic Guidance  Patient: Barry Pope      Date of Birth: 11/30/1945 MRN: [id number] PCP: Mayra Neer, MD      Visit Date: 05/19/2022   Universal Protocol:    Date/Time: 05/19/2022  Consent Given By: the patient  Position: PRONE  Additional Comments: Vital signs were monitored before and after the procedure. Patient was prepped and draped in the usual sterile fashion. The correct patient, procedure, and site was verified.   Injection Procedure Details:   Procedure diagnoses: Lumbar radiculopathy [M54.16]    Meds Administered:  Meds ordered this encounter  Medications   methylPREDNISolone acetate (DEPO-MEDROL) injection 80 mg    Laterality: Right  Location/Site: L3  Needle:5.0 in., 22 ga.  Short bevel or Quincke spinal needle  Needle Placement: Transforaminal  Findings:    -Comments: Excellent flow of contrast along the nerve, nerve root and into the epidural space.  Procedure Details: After squaring off the end-plates to get a true AP view, the C-arm was positioned so that an oblique view of the foramen as noted above was visualized. The target area is just inferior to the "nose of the scotty dog" or sub pedicular. The soft tissues overlying this structure were infiltrated with 2-3 ml. of 1% Lidocaine without Epinephrine.  The spinal needle was inserted toward the target using a "trajectory" view along the fluoroscope beam.  Under AP and lateral visualization, the needle was advanced so it did not puncture dura and was located close the 6 O'Clock position of the pedical in AP tracterory. Biplanar projections were used to confirm position. Aspiration was confirmed to be negative for CSF and/or blood. A 1-2 ml. volume of Isovue-250 was injected and flow of contrast was noted at each level. Radiographs were obtained for documentation purposes.   After attaining the desired  flow of contrast documented above, a 0.5 to 1.0 ml test dose of 0.25% Marcaine was injected into each respective transforaminal space.  The patient was observed for 90 seconds post injection.  After no sensory deficits were reported, and normal lower extremity motor function was noted,   the above injectate was administered so that equal amounts of the injectate were placed at each foramen (level) into the transforaminal epidural space.   Additional Comments:  The patient tolerated the procedure well Dressing: 2 x 2 sterile gauze and Band-Aid    Post-procedure details: Patient was observed during the procedure. Post-procedure instructions were reviewed.  Patient left the clinic in stable condition.

## 2022-05-28 NOTE — Progress Notes (Signed)
Barry Pope - 76 y.o. male MRN [id number]  Date of birth: 07-11-1946  Office Visit Note: Visit Date: 05/19/2022 PCP: Mayra Neer, MD Referred by: Mayra Neer, MD  Subjective: Chief Complaint  Patient presents with   Middle Back - Pain   HPI:  Barry Pope is a 76 y.o. male who comes in today at the request of Dr. Anderson Malta for planned Right L3-4 Lumbar Transforaminal epidural steroid injection with fluoroscopic guidance.  The patient has failed conservative care including home exercise, medications, time and activity modification.  This injection will be diagnostic and hopefully therapeutic.  Please see requesting physician notes for further details and justification.   ROS Otherwise per HPI.  Assessment & Plan: Visit Diagnoses:    ICD-10-CM   1. Lumbar radiculopathy  M54.16 XR C-ARM NO REPORT    Epidural Steroid injection    methylPREDNISolone acetate (DEPO-MEDROL) injection 80 mg      Plan: No additional findings.   Meds & Orders:  Meds ordered this encounter  Medications   methylPREDNISolone acetate (DEPO-MEDROL) injection 80 mg    Orders Placed This Encounter  Procedures   XR C-ARM NO REPORT   Epidural Steroid injection    Follow-up: Return for visit to requesting provider as needed.   Procedures: No procedures performed  Lumbosacral Transforaminal Epidural Steroid Injection - Sub-Pedicular Approach with Fluoroscopic Guidance  Patient: Barry Pope      Date of Birth: 05/20/1946 MRN: [id number] PCP: Mayra Neer, MD      Visit Date: 05/19/2022   Universal Protocol:    Date/Time: 05/19/2022  Consent Given By: the patient  Position: PRONE  Additional Comments: Vital signs were monitored before and after the procedure. Patient was prepped and draped in the usual sterile fashion. The correct patient, procedure, and site was verified.   Injection Procedure Details:   Procedure diagnoses: Lumbar radiculopathy [M54.16]     Meds Administered:  Meds ordered this encounter  Medications   methylPREDNISolone acetate (DEPO-MEDROL) injection 80 mg    Laterality: Right  Location/Site: L3  Needle:5.0 in., 22 ga.  Short bevel or Quincke spinal needle  Needle Placement: Transforaminal  Findings:    -Comments: Excellent flow of contrast along the nerve, nerve root and into the epidural space.  Procedure Details: After squaring off the end-plates to get a true AP view, the C-arm was positioned so that an oblique view of the foramen as noted above was visualized. The target area is just inferior to the "nose of the scotty dog" or sub pedicular. The soft tissues overlying this structure were infiltrated with 2-3 ml. of 1% Lidocaine without Epinephrine.  The spinal needle was inserted toward the target using a "trajectory" view along the fluoroscope beam.  Under AP and lateral visualization, the needle was advanced so it did not puncture dura and was located close the 6 O'Clock position of the pedical in AP tracterory. Biplanar projections were used to confirm position. Aspiration was confirmed to be negative for CSF and/or blood. A 1-2 ml. volume of Isovue-250 was injected and flow of contrast was noted at each level. Radiographs were obtained for documentation purposes.   After attaining the desired flow of contrast documented above, a 0.5 to 1.0 ml test dose of 0.25% Marcaine was injected into each respective transforaminal space.  The patient was observed for 90 seconds post injection.  After no sensory deficits were reported, and normal lower extremity motor function was noted,   the above injectate was administered  so that equal amounts of the injectate were placed at each foramen (level) into the transforaminal epidural space.   Additional Comments:  The patient tolerated the procedure well Dressing: 2 x 2 sterile gauze and Band-Aid    Post-procedure details: Patient was observed during the  procedure. Post-procedure instructions were reviewed.  Patient left the clinic in stable condition.    Clinical History: MRI LUMBAR SPINE WITHOUT CONTRAST   TECHNIQUE: Multiplanar, multisequence MR imaging of the lumbar spine was performed. No intravenous contrast was administered.   COMPARISON:  Lumbar radiography from 4 days ago   FINDINGS: Segmentation: Transitional lumbosacral vertebra numbered S1 based on the lowest visible ribs.   Alignment:  Mild dextroscoliosis   Vertebrae: Discogenic endplate edema at U5-4 and L5-S1. No evidence of aggressive bone lesion or fracture. Remote superior endplate fracture at L2.   Conus medullaris and cauda equina: Conus extends to the L2 level. Conus and cauda equina appear normal.   Paraspinal and other soft tissues: Negative for perispinal mass or inflammation.   Disc levels:   L2-L3: Disc space narrowing and bulging with endplate spurring.   L3-L4: Right eccentric disc narrowing with endplate degeneration and spurring. Rightward predominant disc bulging especially at the foramen and far lateral space. Right subarticular recess narrowing without static L4 compression   L4-L5: Disc narrowing with endplate and facet spurring eccentric to the left. Noncompressive left foraminal and subarticular recess narrowing.   L5-S1:Disc collapse with endplate ridging and facet spurring on both sides. Circumferential disc bulging. Biforaminal impingement, worse on the left. Mild triangular narrowing of the thecal sac.   S1-2: Rudimentary disc space. No degeneration or neural impingement.   IMPRESSION: 1. Transitional lumbosacral vertebra numbered S1. 2. Generalized lumbar spine degeneration with scoliosis. 3. Foraminal impingement on the left more than right at L5-S1.     Electronically Signed   By: Jorje Guild M.D.   On: 04/25/2022 14:16     Objective:  VS:  HT:    WT:   BMI:     BP:(!) 158/77  HR:75bpm  TEMP: ( )   RESP:  Physical Exam Vitals and nursing note reviewed.  Constitutional:      General: He is not in acute distress.    Appearance: Normal appearance. He is not ill-appearing.  HENT:     Head: Normocephalic and atraumatic.     Right Ear: External ear normal.     Left Ear: External ear normal.     Nose: No congestion.  Eyes:     Extraocular Movements: Extraocular movements intact.  Cardiovascular:     Rate and Rhythm: Normal rate.     Pulses: Normal pulses.  Pulmonary:     Effort: Pulmonary effort is normal. No respiratory distress.  Abdominal:     General: There is no distension.     Palpations: Abdomen is soft.  Musculoskeletal:        General: No tenderness or signs of injury.     Cervical back: Neck supple.     Right lower leg: No edema.     Left lower leg: No edema.     Comments: Patient has good distal strength without clonus.  Skin:    Findings: No erythema or rash.  Neurological:     General: No focal deficit present.     Mental Status: He is alert and oriented to person, place, and time.     Sensory: No sensory deficit.     Motor: No weakness or abnormal muscle tone.  Coordination: Coordination normal.  Psychiatric:        Mood and Affect: Mood normal.        Behavior: Behavior normal.      Imaging: No results found.

## 2022-05-30 DIAGNOSIS — H2513 Age-related nuclear cataract, bilateral: Secondary | ICD-10-CM | POA: Diagnosis not present

## 2022-05-30 DIAGNOSIS — H35372 Puckering of macula, left eye: Secondary | ICD-10-CM | POA: Diagnosis not present

## 2022-05-30 DIAGNOSIS — G20 Parkinson's disease: Secondary | ICD-10-CM | POA: Diagnosis not present

## 2022-06-01 ENCOUNTER — Ambulatory Visit: Payer: Medicare Other | Admitting: Orthopedic Surgery

## 2022-06-01 ENCOUNTER — Encounter: Payer: Self-pay | Admitting: Orthopedic Surgery

## 2022-06-01 DIAGNOSIS — M545 Low back pain, unspecified: Secondary | ICD-10-CM

## 2022-06-01 DIAGNOSIS — M5416 Radiculopathy, lumbar region: Secondary | ICD-10-CM | POA: Diagnosis not present

## 2022-06-01 NOTE — Progress Notes (Signed)
Office Visit Note   Patient: Barry Pope           Date of Birth: 04/19/1946           MRN: [id number] Visit Date: 06/01/2022 Requested by: Mayra Neer, MD [address] [place],  [place] [address] PCP: Mayra Neer, MD  Subjective: Chief Complaint  Patient presents with   Other    Scan review    HPI: Barry Pope is a 76 year old patient with primarily back pain.  Does not report much leg pain.  He had an injection with Dr. Ernestina Patches and he is 50% better.  The pain tends to build up during the day.  Not taking any medication.  Likes to do gardening and chores with those of the things that hurt him the most in terms of bending over.  Working out is not particularly painful.              ROS: All systems reviewed are negative as they relate to the chief complaint within the history of present illness.  Patient denies  fevers or chills.   Assessment & Plan: Visit Diagnoses:  1. Lumbar radiculopathy   2. Low back pain, unspecified back pain laterality, unspecified chronicity, unspecified whether sciatica present     Plan: Impression is nonoperative issues in the lumbar spine but he does have generalized lumbar spine degeneration with scoliosis and foraminal impingement on the left more than the right at L5-S1.  He also has fairly significant disc space narrowing at L4-5.  Since he only had about 50% relief I think it is reasonable to try 1 more injection to get him over the hump.  Discussed with him that he can get up to 6 injections a year.  He is right at that threshold between receiving and not receiving another injection.  In general for his activity level and fitness level I think would be reasonable to try 1 more injection with Dr. Ernestina Patches and then follow-up as needed.  Follow-Up Instructions: No follow-ups on file.   Orders:  Orders Placed This Encounter  Procedures   Ambulatory referral to Physical Medicine Rehab   No orders of the defined types were placed  in this encounter.     Procedures: No procedures performed   Clinical Data: No additional findings.  Objective: Vital Signs: There were no vitals taken for this visit.  Physical Exam:   Constitutional: Patient appears well-developed HEENT:  Head: Normocephalic Eyes:EOM are normal Neck: Normal range of motion Cardiovascular: Normal rate Pulmonary/chest: Effort normal Neurologic: Patient is alert Skin: Skin is warm Psychiatric: Patient has normal mood and affect   Ortho Exam: Ortho exam demonstrates no nerve root tension signs.  No groin pain with internal/external Tatian of the leg.  Ankle dorsiflexion plantarflexion quad hamstring strength 5+ out of 5 bilaterally.  Has some pain on the midline as well as in the SI joints bilaterally.  Forward lateral bending is mildly tender.  No paresthesias L1-S1 bilaterally  Specialty Comments:  MRI LUMBAR SPINE WITHOUT CONTRAST   TECHNIQUE: Multiplanar, multisequence MR imaging of the lumbar spine was performed. No intravenous contrast was administered.   COMPARISON:  Lumbar radiography from 4 days ago   FINDINGS: Segmentation: Transitional lumbosacral vertebra numbered S1 based on the lowest visible ribs.   Alignment:  Mild dextroscoliosis   Vertebrae: Discogenic endplate edema at P2-9 and L5-S1. No evidence of aggressive bone lesion or fracture. Remote superior endplate fracture at L2.   Conus medullaris and cauda  equina: Conus extends to the L2 level. Conus and cauda equina appear normal.   Paraspinal and other soft tissues: Negative for perispinal mass or inflammation.   Disc levels:   L2-L3: Disc space narrowing and bulging with endplate spurring.   L3-L4: Right eccentric disc narrowing with endplate degeneration and spurring. Rightward predominant disc bulging especially at the foramen and far lateral space. Right subarticular recess narrowing without static L4 compression   L4-L5: Disc narrowing with endplate  and facet spurring eccentric to the left. Noncompressive left foraminal and subarticular recess narrowing.   L5-S1:Disc collapse with endplate ridging and facet spurring on both sides. Circumferential disc bulging. Biforaminal impingement, worse on the left. Mild triangular narrowing of the thecal sac.   S1-2: Rudimentary disc space. No degeneration or neural impingement.   IMPRESSION: 1. Transitional lumbosacral vertebra numbered S1. 2. Generalized lumbar spine degeneration with scoliosis. 3. Foraminal impingement on the left more than right at L5-S1.     Electronically Signed   By: Jorje Guild M.D.   On: 04/25/2022 14:16  Imaging: No results found.   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   Past Medical History:  Diagnosis Date   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   Allergic rhinitis    Dysphonia 08/27/2019   Gastroesophageal reflux disease 03/26/2019   History of malignant carcinoid tumor of large intestine    History of malignant carcinoid tumor of small intestine    Insomnia    Knee osteoarthritis    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   Male erectile dysfunction, unspecified    Parkinson's disease 12/09/2019   Rosacea, unspecified    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   Vocal fold atrophy     Family History  Problem Relation Age of Onset   Pulmonary fibrosis Mother    Heart attack Father    Alcoholism  Father    Parkinson's disease Father    CAD Father    Lung cancer Sister     Past Surgical History:  Procedure Laterality Date   APPENDECTOMY     COLONOSCOPY  2004/2009/2015   INGUINAL HERNIA REPAIR Right    KNEE SURGERY     PARTIAL COLECTOMY     Social History   Occupational History   Occupation: Retired    Comment: IT  Tobacco Use   Smoking status: Never   Smokeless tobacco: Never  Vaping Use   Vaping Use: Never used  Substance and Sexual Activity   Alcohol use: Yes    Alcohol/week: 14.0 - 21.0 standard drinks of alcohol    Types: 14 - 21 Standard drinks or equivalent per week    Comment: Couple ounces of liquor per night   Drug use: Not Currently    Types: Marijuana    Comment: Very rare "gummy" marijuana consumption   Sexual activity: Not on file

## 2022-06-02 ENCOUNTER — Other Ambulatory Visit (HOSPITAL_BASED_OUTPATIENT_CLINIC_OR_DEPARTMENT_OTHER): Payer: Self-pay

## 2022-06-08 ENCOUNTER — Other Ambulatory Visit (HOSPITAL_BASED_OUTPATIENT_CLINIC_OR_DEPARTMENT_OTHER): Payer: Self-pay

## 2022-06-20 ENCOUNTER — Encounter: Payer: Self-pay | Admitting: Physical Medicine and Rehabilitation

## 2022-06-20 ENCOUNTER — Ambulatory Visit: Payer: Self-pay

## 2022-06-20 ENCOUNTER — Ambulatory Visit: Payer: Medicare Other | Admitting: Physical Medicine and Rehabilitation

## 2022-06-20 VITALS — BP 148/80 | HR 77

## 2022-06-20 DIAGNOSIS — M5416 Radiculopathy, lumbar region: Secondary | ICD-10-CM | POA: Diagnosis not present

## 2022-06-20 MED ORDER — METHYLPREDNISOLONE ACETATE 80 MG/ML IJ SUSP
40.0000 mg | Freq: Once | INTRAMUSCULAR | Status: AC
  Administered 2022-06-20: 40 mg

## 2022-06-20 NOTE — Patient Instructions (Signed)

## 2022-06-20 NOTE — Progress Notes (Signed)
Numeric Pain Rating Scale and Functional Assessment Average Pain 6   In the last MONTH (on 0-10 scale) has pain interfered with the following?  1. General activity like being  able to carry out your everyday physical activities such as walking, climbing stairs, carrying groceries, or moving a chair?  Rating(6)   +Driver, -BT, -Dye Allergies.   Right sided lbp. No leg pain

## 2022-06-24 ENCOUNTER — Other Ambulatory Visit (HOSPITAL_BASED_OUTPATIENT_CLINIC_OR_DEPARTMENT_OTHER): Payer: Self-pay

## 2022-06-24 MED ORDER — TAMSULOSIN HCL 0.4 MG PO CAPS
0.4000 mg | ORAL_CAPSULE | Freq: Every evening | ORAL | 6 refills | Status: DC
  Filled 2022-06-24 – 2022-07-13 (×2): qty 30, 30d supply, fill #0
  Filled 2022-08-07: qty 30, 30d supply, fill #1
  Filled 2022-09-06: qty 30, 30d supply, fill #2
  Filled 2022-10-08 – 2022-10-13 (×2): qty 30, 30d supply, fill #3
  Filled 2022-11-02 – 2022-11-05 (×4): qty 30, 30d supply, fill #4
  Filled 2022-12-07: qty 30, 30d supply, fill #5
  Filled 2023-01-10: qty 17, 17d supply, fill #6
  Filled 2023-01-10: qty 13, 13d supply, fill #6

## 2022-06-27 DIAGNOSIS — R140 Abdominal distension (gaseous): Secondary | ICD-10-CM | POA: Diagnosis not present

## 2022-06-27 DIAGNOSIS — R7989 Other specified abnormal findings of blood chemistry: Secondary | ICD-10-CM | POA: Diagnosis not present

## 2022-06-27 DIAGNOSIS — R945 Abnormal results of liver function studies: Secondary | ICD-10-CM | POA: Diagnosis not present

## 2022-06-27 DIAGNOSIS — R935 Abnormal findings on diagnostic imaging of other abdominal regions, including retroperitoneum: Secondary | ICD-10-CM | POA: Diagnosis not present

## 2022-06-27 DIAGNOSIS — C7A012 Malignant carcinoid tumor of the ileum: Secondary | ICD-10-CM | POA: Diagnosis not present

## 2022-06-29 ENCOUNTER — Other Ambulatory Visit: Payer: Self-pay | Admitting: Neurology

## 2022-06-29 ENCOUNTER — Telehealth: Payer: Self-pay | Admitting: Physician Assistant

## 2022-06-29 ENCOUNTER — Other Ambulatory Visit (HOSPITAL_BASED_OUTPATIENT_CLINIC_OR_DEPARTMENT_OTHER): Payer: Self-pay

## 2022-06-29 MED ORDER — INFLUENZA VAC A&B SA ADJ QUAD 0.5 ML IM PRSY
PREFILLED_SYRINGE | INTRAMUSCULAR | 0 refills | Status: DC
  Filled 2022-06-29: qty 0.5, 1d supply, fill #0

## 2022-06-29 NOTE — Telephone Encounter (Signed)
Scheduled appt per 9/18 referral. Pt is aware of appt date and time. Pt is aware to arrive 15 mins prior to appt time and to bring and updated insurance card. Pt is aware of appt location.

## 2022-06-30 ENCOUNTER — Other Ambulatory Visit (HOSPITAL_BASED_OUTPATIENT_CLINIC_OR_DEPARTMENT_OTHER): Payer: Self-pay

## 2022-06-30 ENCOUNTER — Encounter (HOSPITAL_BASED_OUTPATIENT_CLINIC_OR_DEPARTMENT_OTHER): Payer: Self-pay | Admitting: Pharmacist

## 2022-06-30 MED ORDER — AREXVY 120 MCG/0.5ML IM SUSR
INTRAMUSCULAR | 0 refills | Status: DC
  Filled 2022-06-30: qty 0.5, 1d supply, fill #0

## 2022-07-01 ENCOUNTER — Other Ambulatory Visit (HOSPITAL_BASED_OUTPATIENT_CLINIC_OR_DEPARTMENT_OTHER): Payer: Self-pay

## 2022-07-04 NOTE — Procedures (Signed)
Lumbosacral Transforaminal Epidural Steroid Injection - Sub-Pedicular Approach with Fluoroscopic Guidance  Patient: Barry Pope      Date of Birth: Apr 25, 1946 MRN: [id number] PCP: Mayra Neer, MD      Visit Date: 06/20/2022   Universal Protocol:    Date/Time: 06/20/2022  Consent Given By: the patient  Position: PRONE  Additional Comments: Vital signs were monitored before and after the procedure. Patient was prepped and draped in the usual sterile fashion. The correct patient, procedure, and site was verified.   Injection Procedure Details:   Procedure diagnoses: Lumbar radiculopathy [M54.16]    Meds Administered:  Meds ordered this encounter  Medications   methylPREDNISolone acetate (DEPO-MEDROL) injection 40 mg    Laterality: Bilateral  Location/Site: L3  Needle:5.0 in., 22 ga.  Short bevel or Quincke spinal needle  Needle Placement: Transforaminal  Findings:    -Comments: Excellent flow of contrast along the nerve, nerve root and into the epidural space.  Procedure Details: After squaring off the end-plates to get a true AP view, the C-arm was positioned so that an oblique view of the foramen as noted above was visualized. The target area is just inferior to the "nose of the scotty dog" or sub pedicular. The soft tissues overlying this structure were infiltrated with 2-3 ml. of 1% Lidocaine without Epinephrine.  The spinal needle was inserted toward the target using a "trajectory" view along the fluoroscope beam.  Under AP and lateral visualization, the needle was advanced so it did not puncture dura and was located close the 6 O'Clock position of the pedical in AP tracterory. Biplanar projections were used to confirm position. Aspiration was confirmed to be negative for CSF and/or blood. A 1-2 ml. volume of Isovue-250 was injected and flow of contrast was noted at each level. Radiographs were obtained for documentation purposes.   After attaining the  desired flow of contrast documented above, a 0.5 to 1.0 ml test dose of 0.25% Marcaine was injected into each respective transforaminal space.  The patient was observed for 90 seconds post injection.  After no sensory deficits were reported, and normal lower extremity motor function was noted,   the above injectate was administered so that equal amounts of the injectate were placed at each foramen (level) into the transforaminal epidural space.   Additional Comments:  The patient tolerated the procedure well Dressing: 2 x 2 sterile gauze and Band-Aid    Post-procedure details: Patient was observed during the procedure. Post-procedure instructions were reviewed.  Patient left the clinic in stable condition.

## 2022-07-04 NOTE — Progress Notes (Signed)
Barry Pope - 76 y.o. male MRN [id number]  Date of birth: 1946-03-13  Office Visit Note: Visit Date: 06/20/2022 PCP: Mayra Neer, MD Referred by: Mayra Neer, MD  Subjective: Chief Complaint  Patient presents with   Lower Back - Pain   HPI:  Barry Pope is a 76 y.o. male who comes in today for planned repeat Right L3-4  Lumbar Transforaminal epidural steroid injection with fluoroscopic guidance.  The patient has failed conservative care including home exercise, medications, time and activity modification.  This injection will be diagnostic and hopefully therapeutic.  Please see requesting physician notes for further details and justification. Patient received more than 50% pain relief from prior injection.   Referring: Dr. Anderson Malta  Dr. Tonita Cong requested repeat epidural injection however the patient is not really having really right-sided symptoms at this point that its mainly across his lower back with standing and ambulating.  We will go ahead today and complete bilateral L3 transforaminal injection but would consider diagnostic medial branch blocks of the lower lumbar facets with goal towards radiofrequency ablation depending on relief with the epidural injection.  Patient has transitional S1 segment.   ROS Otherwise per HPI.  Assessment & Plan: Visit Diagnoses:    ICD-10-CM   1. Lumbar radiculopathy  M54.16 XR C-ARM NO REPORT    Epidural Steroid injection    methylPREDNISolone acetate (DEPO-MEDROL) injection 40 mg      Plan: No additional findings.   Meds & Orders:  Meds ordered this encounter  Medications   methylPREDNISolone acetate (DEPO-MEDROL) injection 40 mg    Orders Placed This Encounter  Procedures   XR C-ARM NO REPORT   Epidural Steroid injection    Follow-up: Return for visit to requesting provider as needed.   Procedures: No procedures performed  Lumbosacral Transforaminal Epidural Steroid Injection - Sub-Pedicular Approach with  Fluoroscopic Guidance  Patient: Barry Pope      Date of Birth: [birth year] MRN: [id number] PCP: Mayra Neer, MD      Visit Date: 06/20/2022   Universal Protocol:    Date/Time: 06/20/2022  Consent Given By: the patient  Position: PRONE  Additional Comments: Vital signs were monitored before and after the procedure. Patient was prepped and draped in the usual sterile fashion. The correct patient, procedure, and site was verified.   Injection Procedure Details:   Procedure diagnoses: Lumbar radiculopathy [M54.16]    Meds Administered:  Meds ordered this encounter  Medications   methylPREDNISolone acetate (DEPO-MEDROL) injection 40 mg    Laterality: Bilateral  Location/Site: L3  Needle:5.0 in., 22 ga.  Short bevel or Quincke spinal needle  Needle Placement: Transforaminal  Findings:    -Comments: Excellent flow of contrast along the nerve, nerve root and into the epidural space.  Procedure Details: After squaring off the end-plates to get a true AP view, the C-arm was positioned so that an oblique view of the foramen as noted above was visualized. The target area is just inferior to the "nose of the scotty dog" or sub pedicular. The soft tissues overlying this structure were infiltrated with 2-3 ml. of 1% Lidocaine without Epinephrine.  The spinal needle was inserted toward the target using a "trajectory" view along the fluoroscope beam.  Under AP and lateral visualization, the needle was advanced so it did not puncture dura and was located close the 6 O'Clock position of the pedical in AP tracterory. Biplanar projections were used to confirm position. Aspiration was confirmed to be negative for CSF and/or  blood. A 1-2 ml. volume of Isovue-250 was injected and flow of contrast was noted at each level. Radiographs were obtained for documentation purposes.   After attaining the desired flow of contrast documented above, a 0.5 to 1.0 ml test dose of 0.25% Marcaine  was injected into each respective transforaminal space.  The patient was observed for 90 seconds post injection.  After no sensory deficits were reported, and normal lower extremity motor function was noted,   the above injectate was administered so that equal amounts of the injectate were placed at each foramen (level) into the transforaminal epidural space.   Additional Comments:  The patient tolerated the procedure well Dressing: 2 x 2 sterile gauze and Band-Aid    Post-procedure details: Patient was observed during the procedure. Post-procedure instructions were reviewed.  Patient left the clinic in stable condition.    Clinical History: MRI LUMBAR SPINE WITHOUT CONTRAST   TECHNIQUE: Multiplanar, multisequence MR imaging of the lumbar spine was performed. No intravenous contrast was administered.   COMPARISON:  Lumbar radiography from 4 days ago   FINDINGS: Segmentation: Transitional lumbosacral vertebra numbered S1 based on the lowest visible ribs.   Alignment:  Mild dextroscoliosis   Vertebrae: Discogenic endplate edema at W0-9 and L5-S1. No evidence of aggressive bone lesion or fracture. Remote superior endplate fracture at L2.   Conus medullaris and cauda equina: Conus extends to the L2 level. Conus and cauda equina appear normal.   Paraspinal and other soft tissues: Negative for perispinal mass or inflammation.   Disc levels:   L2-L3: Disc space narrowing and bulging with endplate spurring.   L3-L4: Right eccentric disc narrowing with endplate degeneration and spurring. Rightward predominant disc bulging especially at the foramen and far lateral space. Right subarticular recess narrowing without static L4 compression   L4-L5: Disc narrowing with endplate and facet spurring eccentric to the left. Noncompressive left foraminal and subarticular recess narrowing.   L5-S1:Disc collapse with endplate ridging and facet spurring on both sides. Circumferential  disc bulging. Biforaminal impingement, worse on the left. Mild triangular narrowing of the thecal sac.   S1-2: Rudimentary disc space. No degeneration or neural impingement.   IMPRESSION: 1. Transitional lumbosacral vertebra numbered S1. 2. Generalized lumbar spine degeneration with scoliosis. 3. Foraminal impingement on the left more than right at L5-S1.     Electronically Signed   By: Jorje Guild M.D.   On: 04/25/2022 14:16     Objective:  VS:  HT:    WT:   BMI:     BP:(!) 148/80  HR:77bpm  TEMP: ( )  RESP:  Physical Exam Vitals and nursing note reviewed.  Constitutional:      General: He is not in acute distress.    Appearance: Normal appearance. He is not ill-appearing.  HENT:     Head: Normocephalic and atraumatic.     Right Ear: External ear normal.     Left Ear: External ear normal.     Nose: No congestion.  Eyes:     Extraocular Movements: Extraocular movements intact.  Cardiovascular:     Rate and Rhythm: Normal rate.     Pulses: Normal pulses.  Pulmonary:     Effort: Pulmonary effort is normal. No respiratory distress.  Abdominal:     General: There is no distension.     Palpations: Abdomen is soft.  Musculoskeletal:        General: No tenderness or signs of injury.     Cervical back: Neck supple.  Right lower leg: No edema.     Left lower leg: No edema.     Comments: Patient has good distal strength without clonus.  Skin:    Findings: No erythema or rash.  Neurological:     General: No focal deficit present.     Mental Status: He is alert and oriented to person, place, and time.     Sensory: No sensory deficit.     Motor: No weakness or abnormal muscle tone.     Coordination: Coordination normal.  Psychiatric:        Mood and Affect: Mood normal.        Behavior: Behavior normal.      Imaging: No results found.

## 2022-07-06 ENCOUNTER — Other Ambulatory Visit (HOSPITAL_BASED_OUTPATIENT_CLINIC_OR_DEPARTMENT_OTHER): Payer: Self-pay

## 2022-07-08 ENCOUNTER — Encounter: Payer: Self-pay | Admitting: Physician Assistant

## 2022-07-08 ENCOUNTER — Inpatient Hospital Stay: Payer: Medicare Other

## 2022-07-08 ENCOUNTER — Inpatient Hospital Stay: Payer: Medicare Other | Attending: Physician Assistant | Admitting: Physician Assistant

## 2022-07-08 VITALS — BP 149/82 | HR 70 | Temp 98.1°F | Resp 17 | Ht 69.0 in | Wt 159.2 lb

## 2022-07-08 DIAGNOSIS — K6389 Other specified diseases of intestine: Secondary | ICD-10-CM

## 2022-07-08 DIAGNOSIS — R1907 Generalized intra-abdominal and pelvic swelling, mass and lump: Secondary | ICD-10-CM | POA: Insufficient documentation

## 2022-07-08 DIAGNOSIS — R232 Flushing: Secondary | ICD-10-CM | POA: Diagnosis not present

## 2022-07-08 DIAGNOSIS — K219 Gastro-esophageal reflux disease without esophagitis: Secondary | ICD-10-CM | POA: Diagnosis not present

## 2022-07-08 DIAGNOSIS — G4700 Insomnia, unspecified: Secondary | ICD-10-CM | POA: Diagnosis not present

## 2022-07-08 DIAGNOSIS — Z85060 Personal history of malignant carcinoid tumor of small intestine: Secondary | ICD-10-CM | POA: Diagnosis not present

## 2022-07-08 DIAGNOSIS — Z79899 Other long term (current) drug therapy: Secondary | ICD-10-CM | POA: Diagnosis not present

## 2022-07-08 DIAGNOSIS — Z859 Personal history of malignant neoplasm, unspecified: Secondary | ICD-10-CM

## 2022-07-08 DIAGNOSIS — G20 Parkinson's disease: Secondary | ICD-10-CM | POA: Diagnosis not present

## 2022-07-08 LAB — CBC WITH DIFFERENTIAL (CANCER CENTER ONLY)
Abs Immature Granulocytes: 0.03 10*3/uL (ref 0.00–0.07)
Basophils Absolute: 0 10*3/uL (ref 0.0–0.1)
Basophils Relative: 1 %
Eosinophils Absolute: 0.2 10*3/uL (ref 0.0–0.5)
Eosinophils Relative: 3 %
HCT: 41 % (ref 39.0–52.0)
Hemoglobin: 14.6 g/dL (ref 13.0–17.0)
Immature Granulocytes: 0 %
Lymphocytes Relative: 15 %
Lymphs Abs: 1.1 10*3/uL (ref 0.7–4.0)
MCH: 32.2 pg (ref 26.0–34.0)
MCHC: 35.6 g/dL (ref 30.0–36.0)
MCV: 90.3 fL (ref 80.0–100.0)
Monocytes Absolute: 0.5 10*3/uL (ref 0.1–1.0)
Monocytes Relative: 7 %
Neutro Abs: 5.6 10*3/uL (ref 1.7–7.7)
Neutrophils Relative %: 74 %
Platelet Count: 233 10*3/uL (ref 150–400)
RBC: 4.54 MIL/uL (ref 4.22–5.81)
RDW: 11.6 % (ref 11.5–15.5)
WBC Count: 7.5 10*3/uL (ref 4.0–10.5)
nRBC: 0 % (ref 0.0–0.2)

## 2022-07-08 LAB — CMP (CANCER CENTER ONLY)
ALT: 13 U/L (ref 0–44)
AST: 14 U/L — ABNORMAL LOW (ref 15–41)
Albumin: 4.2 g/dL (ref 3.5–5.0)
Alkaline Phosphatase: 99 U/L (ref 38–126)
Anion gap: 3 — ABNORMAL LOW (ref 5–15)
BUN: 15 mg/dL (ref 8–23)
CO2: 30 mmol/L (ref 22–32)
Calcium: 8.9 mg/dL (ref 8.9–10.3)
Chloride: 104 mmol/L (ref 98–111)
Creatinine: 0.93 mg/dL (ref 0.61–1.24)
GFR, Estimated: >60 mL/min (ref >60)
Glucose, Bld: 112 mg/dL — ABNORMAL HIGH (ref 70–99)
Potassium: 4.7 mmol/L (ref 3.5–5.1)
Sodium: 137 mmol/L (ref 135–145)
Total Bilirubin: 0.5 mg/dL (ref 0.3–1.2)
Total Protein: 6.9 g/dL (ref 6.5–8.1)

## 2022-07-08 NOTE — Progress Notes (Unsigned)
[place] Telephone:(336) [phone]   Fax:([phone]) [phone]  INITIAL CONSULTATION:  Patient Care Team: Mayra Neer, Barry Pope as PCP - General (Family Medicine) Tat, Eustace Quail, DO as Consulting Physician (Neurology)  CHIEF COMPLAINTS/PURPOSE OF CONSULTATION:  Calcified mesenteric mass  HISTORY OF PRESENTING ILLNESS:  Barry Pope 76 y.o. male with medical history significant for small bowel carcinoid tumor s/p resection, Parkinson's disease, GERD, rosacea and osteoarthritis.  He presents to the diagnostic clinic for evaluation for recent abnormal CT scan concerning for mesenteric mass.  He is accompanied by his wife for this visit.  On review of the previous records, Barry Pope presented to the hospital for altered mental status with weakness in April 2023.  Work-up included CT scan of the chest, abdomen and pelvis obtained on 01/27/2022.  Results included a 2.6 x 1.3 cm calcified mesenteric mass in the left mid abdomen.  On exam today, Barry Pope reports that his energy levels are fairly stable.  He tries to stay active and goes to exercise classes specific for Parkinson's disease.  He does not require any assistance for ambulation.  He denies any appetite changes or weight loss.  He denies nausea, vomiting or abdominal pain.  His bowel habits are unchanged without any recurrent episodes of diarrhea or constipation.  He denies easy bruising or signs of active bleeding.  He reports intermittent episodes of flushing.  He denies fevers, chills, night sweats, shortness of breath, chest pain, palpitations or cough.  He has no other complaints.  Rest of the 10 point ROS is below.  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   Vocal fold atrophy     SURGICAL HISTORY: Past Surgical History:  Procedure Laterality Date   APPENDECTOMY     COLONOSCOPY  2004/2009/2015   INGUINAL HERNIA REPAIR Right    KNEE SURGERY     PARTIAL COLECTOMY      SOCIAL HISTORY: Social History   Socioeconomic History   Marital status: Married    Spouse name: Jeani Hawking   Number of children: 2   Years of education: 16   Highest education level: Bachelor's degree (e.g., BA, AB, BS)  Occupational History   Occupation: Retired    Comment: IT  Tobacco Use   Smoking status: Never   Smokeless tobacco: Never  Vaping Use   Vaping Use: Never used  Substance and Sexual Activity   Alcohol use: Not Currently    Comment: 1.5 oz/day   Drug use: Not Currently    Types: Marijuana    Comment: Very rare "gummy" marijuana consumption   Sexual activity: Not on file  Other Topics Concern   Not on file  Social History Narrative   Drinks caffiene   Right handed    2 story home   Lives with spouse   Social Determinants of Health   Financial Resource Strain: Not on file  Food Insecurity: Not on file  Transportation Needs: Not on file  Physical Activity: Not on file  Stress: Not on file  Social Connections: Not on file  Intimate Partner Violence: Not on file    FAMILY HISTORY: Family History  Problem Relation Age of Onset   Pulmonary fibrosis Mother    Heart attack Father    Alcoholism Father    Parkinson's disease Father    CAD Father    Lung cancer Sister     ALLERGIES:  has No Known Allergies.  MEDICATIONS:  Current Outpatient Medications  Medication Sig Dispense Refill   acyclovir (ZOVIRAX) 200 MG capsule 1 capusle Orally four times a day as needed 30 capsule 2   carbidopa-levodopa (SINEMET  IR) 25-100 MG tablet Take 1 tablet by mouth 3 (three) times daily. 270 tablet 1   ibuprofen (ADVIL) 200 MG tablet Take 400 mg by mouth every 6 (six) hours as needed for mild pain.     sertraline (ZOLOFT) 50 MG tablet Take 1 tablet (50 mg total) by mouth every evening. 30 tablet 11   tamsulosin (FLOMAX) 0.4 MG CAPS capsule Take 1 capsule Once a day in the evening for urinary frequency. 30 capsule 6   traZODone (DESYREL) 50 MG tablet TAKE 1/2-1 TABLET BY MOUTH AT BEDTIME AS NEEDED (Patient taking differently: Take 25-50 mg by mouth at bedtime.) 90 tablet 3   influenza vaccine adjuvanted (FLUAD) 0.5 ML injection Inject into the muscle. 0.5 mL 0   RSV vaccine recomb adjuvanted (AREXVY) 120 MCG/0.5ML injection Inject into the muscle. 0.5 mL 0   No current facility-administered medications for this visit.    REVIEW OF SYSTEMS:   Constitutional: ( - ) fevers, ( - )  chills , ( - ) night sweats Eyes: ( - ) blurriness of vision, ( - ) double vision, ( - ) watery eyes Ears, nose, mouth, throat, and face: ( - ) mucositis, ( - ) sore throat Respiratory: ( - ) cough, ( - ) dyspnea, ( - ) wheezes Cardiovascular: ( - ) palpitation, ( - ) chest discomfort, ( - ) lower extremity swelling Gastrointestinal:  ( - ) nausea, ( - ) heartburn, ( - ) change in bowel habits Skin: ( - ) abnormal skin rashes Lymphatics: ( - ) new lymphadenopathy, ( - ) easy bruising Neurological: ( - ) numbness, ( - ) tingling, ( - ) new weaknesses Behavioral/Psych: ( - ) mood change, ( - ) new changes  All other systems were reviewed with the patient and are negative.  PHYSICAL EXAMINATION: ECOG PERFORMANCE STATUS: 1 - Symptomatic but completely ambulatory  Vitals:   07/08/22 1142  BP: (!) 149/82  Pulse: 70  Resp: 17  Temp: 98.1 F (36.7 C)  SpO2: 100%   Filed Weights   07/08/22 1142  Weight: 159 lb 3.2 oz (72.2 kg)    GENERAL: well appearing male in NAD  SKIN: skin color, texture, turgor are normal, no rashes or  significant lesions EYES: conjunctiva are pink and non-injected, sclera clear OROPHARYNX: no exudate, no erythema; lips, buccal mucosa, and tongue normal  NECK: supple, non-tender LYMPH:  no palpable lymphadenopathy in the cervical or supraclavicular lymph nodes.  LUNGS: clear to auscultation and percussion with normal breathing effort HEART: regular rate & rhythm and no murmurs and no lower extremity edema ABDOMEN: soft, non-tender, non-distended, normal bowel sounds Musculoskeletal: no cyanosis of digits and no clubbing  PSYCH: alert & oriented x 3, fluent speech NEURO: no focal motor/sensory deficits  LABORATORY DATA:  I have reviewed the data as listed    Latest Ref Rng & Units 01/30/2022  4:06 AM 01/29/2022    2:32 AM 01/28/2022    2:32 AM  CBC  WBC 4.0 - 10.5 K/uL 5.1  4.5  4.3   Hemoglobin 13.0 - 17.0 g/dL 14.1  12.8  13.2   Hematocrit 39.0 - 52.0 % 39.2  36.1  37.5   Platelets 150 - 400 K/uL 152  137  135        Latest Ref Rng & Units 01/30/2022    4:06 AM 01/29/2022    2:32 AM 01/28/2022    2:32 AM  CMP  Glucose 70 - 99 mg/dL 107  116  161   BUN 8 - 23 mg/dL '9  10  10   '$ Creatinine 0.61 - 1.24 mg/dL 1.01  0.96  1.16   Sodium 135 - 145 mmol/L 134  132  129   Potassium 3.5 - 5.1 mmol/L 4.0  3.9  3.7   Chloride 98 - 111 mmol/L 101  103  99   CO2 22 - 32 mmol/L '22  22  23   '$ Calcium 8.9 - 10.3 mg/dL 8.5  8.0  8.2   Total Protein 6.5 - 8.1 g/dL  5.3  5.4   Total Bilirubin 0.3 - 1.2 mg/dL  1.1  0.7   Alkaline Phos 38 - 126 U/L  177  137   AST 15 - 41 U/L  161  148   ALT 0 - 44 U/L  95  188      RADIOGRAPHIC STUDIES: I have personally reviewed the radiological images as listed and agreed with the findings in the report. XR C-ARM NO REPORT  Result Date: 06/20/2022 Please see Notes tab for imaging impression.  Epidural Steroid injection  Result Date: 06/20/2022 Barry Sinning, Barry Pope     07/04/2022 11:53 PM Lumbosacral Transforaminal Epidural Steroid Injection -  Sub-Pedicular Approach with Fluoroscopic Guidance Patient: Barry Pope     Date of Birth: 04/10/1946 MRN: [id number] PCP: Mayra Neer, Barry Pope     Visit Date: 06/20/2022  Universal Protocol:   Date/Time: 06/20/2022 Consent Given By: the patient Position: PRONE Additional Comments: Vital signs were monitored before and after the procedure. Patient was prepped and draped in the usual sterile fashion. The correct patient, procedure, and site was verified. Injection Procedure Details: Procedure diagnoses: Lumbar radiculopathy [M54.16]  Meds Administered: Meds ordered this encounter Medications  methylPREDNISolone acetate (DEPO-MEDROL) injection 40 mg Laterality: Bilateral Location/Site: L3 Needle:5.0 in., 22 ga.  Short bevel or Quincke spinal needle Needle Placement: Transforaminal Findings:   -Comments: Excellent flow of contrast along the nerve, nerve root and into the epidural space. Procedure Details: After squaring off the end-plates to get a true AP view, the C-arm was positioned so that an oblique view of the foramen as noted above was visualized. The target area is just inferior to the "nose of the scotty dog" or sub pedicular. The soft tissues overlying this structure were infiltrated with 2-3 ml. of 1% Lidocaine without Epinephrine. The spinal needle was inserted toward the target using a "trajectory" view along the fluoroscope beam.  Under AP and lateral visualization, the needle was advanced so it did not puncture dura and was located close the 6 O'Clock position of the pedical in AP tracterory. Biplanar projections were used to confirm position. Aspiration was confirmed to be negative for CSF and/or blood. A 1-2 ml. volume of Isovue-250 was injected and flow of contrast was noted at each level. Radiographs were obtained for documentation purposes. After attaining the desired flow of contrast documented  above, a 0.5 to 1.0 ml test dose of 0.25% Marcaine was injected into each respective transforaminal  space.  The patient was observed for 90 seconds post injection.  After no sensory deficits were reported, and normal lower extremity motor function was noted,   the above injectate was administered so that equal amounts of the injectate were placed at each foramen (level) into the transforaminal epidural space. Additional Comments: The patient tolerated the procedure well Dressing: 2 x 2 sterile gauze and Band-Aid  Post-procedure details: Patient was observed during the procedure. Post-procedure instructions were reviewed. Patient left the clinic in stable condition.    ASSESSMENT & PLAN NUMAIR MASDEN is a 76 y.o. male who presents to the diagnostic clinic for evaluation for calcified mesenteric mass.  We reviewed possible etiologies including benign process, recurrent carcinoid tumor or metastatic lesion.  Most recent CT scan is from 01/27/2022 so we would like to obtain repeat imaging.  We will request a PET dotatate scan to further evaluate for carcinoid tumor.  Patient will proceed with laboratory evaluation today to check for CBC, CMP, chromogranin levels and 24 hour urine 5-HIAA.  We will present patient's case at the multidisciplinary GI tumor board to discuss best approach for biopsy.   #Calcified mesenteric mass: --Labs today to check CBC, CMP, chromogranin levels and 24 hour urine 5-HIAA --PET DOTATATE for further staging and evaluation for recurrent carcinoid tumor.  --Based on upcoming imaging, we will recommend biopsy.  --RTC once workup is complete   #History of ileal carcinoid tumor: --Underwent resection with ileocolonic anastomosis. Around 2004. Records not available for review.   #History of LFTs/hyperbilirubinemia: --Etiologies including recent EBV infection, fatty liver versus autoimmune process. --Repeat liver panel today. .  Orders Placed This Encounter  Procedures   CBC with Differential ([place] Only)    Standing Status:   Future    Standing Expiration Date:    07/09/2023   CMP ([place] only)    Standing Status:   Future    Standing Expiration Date:   07/09/2023   Chromogranin A    Standing Status:   Future    Standing Expiration Date:   07/08/2023   5 HIAA, quantitative, urine, 24 hour    Standing Status:   Future    Standing Expiration Date:   07/09/2023    All questions were answered. The patient knows to call the clinic with any problems, questions or concerns.  I have spent a total of 60 minutes minutes of face-to-face and non-face-to-face time, preparing to see the patient, obtaining and/or reviewing separately obtained history, performing a medically appropriate examination, counseling and educating the patient, ordering tests/procedures,  documenting clinical information in the electronic health record, and care coordination.   Barry Query, Barry Pope Department of Hematology/Oncology [place] [facility] Phone: [phone]  Patient was seen with Barry Pope.   Addendum  I have seen the patient, examined him. I agree with the assessment and and plan and have edited the notes.   Pt is a 76 y.o. male with medical history significant for small bowel carcinoid tumor s/p resection about 20 years ago, Parkinson's disease, GERD, rosacea and osteoarthritis, was referred for incidental finding of a calcified mesenteric mass on CT scan.  I personally reviewed the scan images, and discussed the possible etiology of the mesenteric mass, including benign vs malignancy, such as carcinoid tumor.  We will obtain dotatate PET scan and check tumor marker CEA, chromogranin A, and a 24-hour  urine 5-HIAA.  We will review his case in our GI conference, to see if we can obtain tissue diagnosis, which may be challenge. If biopsy is not feasible and PET scan negative, we will likely repeat CT in 3 months for close monitoring, since he is not symptomatic from this.  All questions were answered.  Barry Pope 07/08/2022

## 2022-07-11 ENCOUNTER — Telehealth: Payer: Self-pay | Admitting: Hematology

## 2022-07-11 ENCOUNTER — Other Ambulatory Visit: Payer: Self-pay

## 2022-07-11 NOTE — Telephone Encounter (Signed)
Per 9/29 los called pt about appointment

## 2022-07-12 ENCOUNTER — Inpatient Hospital Stay: Payer: Medicare Other | Attending: Physician Assistant

## 2022-07-12 ENCOUNTER — Other Ambulatory Visit: Payer: Self-pay

## 2022-07-12 DIAGNOSIS — K6389 Other specified diseases of intestine: Secondary | ICD-10-CM

## 2022-07-12 DIAGNOSIS — R1907 Generalized intra-abdominal and pelvic swelling, mass and lump: Secondary | ICD-10-CM | POA: Insufficient documentation

## 2022-07-12 DIAGNOSIS — Z859 Personal history of malignant neoplasm, unspecified: Secondary | ICD-10-CM

## 2022-07-12 LAB — CHROMOGRANIN A: Chromogranin A (ng/mL): 87.6 ng/mL (ref 0.0–101.8)

## 2022-07-12 NOTE — Progress Notes (Signed)
I left vm for Barry Pope requesting a call back.  I provided my direct number.

## 2022-07-12 NOTE — Progress Notes (Signed)
I spoke with [name] and let her know that so far Barry Pope lab results are normal.  All questions were answered. She verbalized understanding.

## 2022-07-12 NOTE — Progress Notes (Signed)
Late entry: I met with Barry Pope and his wife after his appt with Dede Query, PA-C and Dr Burr Medico on 07/08/2022. I explained my role as a nurse navigator and provided my contact information.

## 2022-07-13 ENCOUNTER — Other Ambulatory Visit (HOSPITAL_BASED_OUTPATIENT_CLINIC_OR_DEPARTMENT_OTHER): Payer: Self-pay

## 2022-07-14 LAB — 5 HIAA, QUANTITATIVE, URINE, 24 HOUR
5-HIAA, Ur: 1.6 mg/L
5-HIAA,Quant.,24 Hr Urine: 4.2 mg/24 hr (ref 0.0–14.9)
Total Volume: 2650

## 2022-07-27 ENCOUNTER — Ambulatory Visit (HOSPITAL_COMMUNITY): Payer: Medicare Other

## 2022-07-29 ENCOUNTER — Ambulatory Visit (HOSPITAL_COMMUNITY)
Admission: RE | Admit: 2022-07-29 | Discharge: 2022-07-29 | Disposition: A | Discharge status: Home / Self Care | Payer: Medicare Other | Source: Ambulatory Visit | Attending: Physician Assistant | Admitting: Physician Assistant

## 2022-07-29 DIAGNOSIS — K6389 Other specified diseases of intestine: Secondary | ICD-10-CM | POA: Insufficient documentation

## 2022-07-29 DIAGNOSIS — I700 Atherosclerosis of aorta: Secondary | ICD-10-CM | POA: Insufficient documentation

## 2022-07-29 DIAGNOSIS — I2510 Atherosclerotic heart disease of native coronary artery without angina pectoris: Secondary | ICD-10-CM | POA: Insufficient documentation

## 2022-07-29 DIAGNOSIS — R1907 Generalized intra-abdominal and pelvic swelling, mass and lump: Secondary | ICD-10-CM | POA: Diagnosis not present

## 2022-07-29 DIAGNOSIS — Z86012 Personal history of benign carcinoid tumor: Secondary | ICD-10-CM | POA: Diagnosis not present

## 2022-07-29 DIAGNOSIS — Z85060 Personal history of malignant carcinoid tumor of small intestine: Secondary | ICD-10-CM | POA: Diagnosis not present

## 2022-07-29 MED ORDER — COPPER CU 64 DOTATATE 1 MCI/ML IV SOLN
4.0000 | Freq: Once | INTRAVENOUS | Status: AC
  Administered 2022-07-29: 3.3 via INTRAVENOUS

## 2022-08-01 ENCOUNTER — Other Ambulatory Visit (HOSPITAL_BASED_OUTPATIENT_CLINIC_OR_DEPARTMENT_OTHER): Payer: Self-pay

## 2022-08-02 ENCOUNTER — Ambulatory Visit: Payer: Medicare Other | Admitting: Hematology

## 2022-08-02 ENCOUNTER — Other Ambulatory Visit: Payer: Medicare Other

## 2022-08-07 ENCOUNTER — Other Ambulatory Visit (HOSPITAL_BASED_OUTPATIENT_CLINIC_OR_DEPARTMENT_OTHER): Payer: Self-pay

## 2022-08-08 ENCOUNTER — Other Ambulatory Visit (HOSPITAL_BASED_OUTPATIENT_CLINIC_OR_DEPARTMENT_OTHER): Payer: Self-pay

## 2022-08-09 NOTE — Progress Notes (Unsigned)
Assessment/Plan:   1.  Parkinsons Disease  -His genetic testing was negative.  -continue carbidopa/levodopa 25/100 tid  -add carbidopa/levodopa 50/200 CR at bed for RLS and getting up in middle of the night.  Had fall from R leg sticking on floor in middle of the night.  -We discussed that it used to be thought that levodopa would increase risk of melanoma but now it is believed that Parkinsons itself likely increases risk of melanoma. he is to get regular skin checks.  He is following regularly with dermatology.   2.  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  Discussed neurocognitive testing, but ultimately they decided to hold on that.  Did discuss our extensive wait for that if they decided to change their mind.  3.  Depression  -on sertraline, 50 mg daily.     4.   RBD and insomnia  -following with Dr. Maxwell Caul  -On melatonin, 10 mg nightly.  -They state that they were told to ask me about more sleep medication.  He is already taking trazodone, low-dose, without success.  I really do not want to add anything further given the state of his liver enzymes.  They do not disagree.  We did discuss sleep hygiene extensively and the timing of his naps.  5.  B12 deficiency  -On supplementation  6.  RLS  -will consider adding carbidopa/levodopa 50/200 at bedtime but am not making that change right now given liver enzymes  7.  Confusion/MS change  -We stopped amantadine at the time, but it was clear it was not from the amantadine.  Ultimately, patient was diagnosed with EBV hepatitis, which self resolved.  8.  Low back pain/lumbar radiculopathy  -Following with Dr. Ernestina Patches.  Received injections.  9.  Insomnia  -f/u with Dr. Brigitte Pulse re: trazodone.    Subjective:   Barry Pope was seen today in follow up.  My previous records were reviewed prior to todays visit as well as outside  records available to me.  When I saw the patient last, in April, he was just out of the hospital for mental status change, fever, hyponatremia and significantly elevated liver enzymes.  He ended up seeing a nurse practitioner at the liver care center here in [place] and she felt that the patient had EBV hepatitis.  Ultimately, the patient's liver enzymes did normalize.  He did follow-up with oncology regarding potential mass in the abdominal mesentery.  He had a PET scan and that was negative.  He has been following with Dr. Ernestina Patches for injections for low back pain/lumbar radiculopathy.  In regards to his Parkinson's, the patient denies any falls.  Last visit, we considered adding bedtime levodopa for restless leg, but decided to hold given all the acute things that were going on with him at the time.  Today, he reports that he has been extra levodopa at bed for RLS (the IR).  He is having sleepless nights with trazodone.  Dr. Lenore Manner that.  He has had 2 falls since our last visit.  One was in flower beds - fell on piece of drift wood.  On another occasion, he fell in middle of the night.  R leg stuck to the floor and fell.  He is exercising.  Doing PWR moves.  Current prescribed movement disorder medications: carbidopa/levodopa 25/100, 7am/11am/4pm B12  PREVIOUS MEDICATIONS: Amantadine (stopped when he was in the hospital  for confusion, but he had a lot of reasons to have confusion at that time, including hyponatremia, significantly elevated liver enzymes, fever and ultimately dx with ebv hepatitis)  ALLERGIES:  No Known Allergies  CURRENT MEDICATIONS:  Outpatient Encounter Medications as of 08/11/2022  Medication Sig   acyclovir (ZOVIRAX) 200 MG capsule 1 capusle Orally four times a day as needed   carbidopa-levodopa (SINEMET IR) 25-100 MG tablet Take 1 tablet by mouth 3 (three) times daily.   COVID-19 mRNA vaccine 2023-2024 (COMIRNATY) syringe Inject into the muscle.   ibuprofen (ADVIL) 200 MG  tablet Take 400 mg by mouth every 6 (six) hours as needed for mild pain.   sertraline (ZOLOFT) 50 MG tablet Take 1 tablet (50 mg total) by mouth every evening.   tamsulosin (FLOMAX) 0.4 MG CAPS capsule Take 1 capsule Once a day in the evening for urinary frequency.   traZODone (DESYREL) 50 MG tablet Take 50 mg by mouth at bedtime.   No facility-administered encounter medications on file as of 08/11/2022.    Objective:   PHYSICAL EXAMINATION:    VITALS:   Vitals:   08/11/22 1431  BP: 124/78  Pulse: 71  SpO2: 97%  Weight: 160 lb 9.6 oz (72.8 kg)  Height: '5\' 9"'$  (1.753 m)        GEN:  The patient appears stated age and is in NAD. HEENT:  Normocephalic, atraumatic.  The mucous membranes are moist. The superficial temporal arteries are without ropiness or tenderness. CV:  RRR Lungs:  CTAB Neck/HEME:  There are no carotid bruits bilaterally.  Neurological examination:  Orientation: The patient is alert and oriented x3. Cranial nerves: There is good facial symmetry with mild facial hypomimia. The speech is fluent and clear.  He is hypophonic.  Soft palate rises symmetrically and there is no tongue deviation. Hearing is intact to conversational tone. Sensation: Sensation is intact to light touch throughout Motor: Strength is 5/5 in the UE/LE  Movement examination: Tone: There is  normal tone in the upper and lower extremities. Abnormal movements:  none today Coordination:  There is no decremation today Gait and Station: The patient has no difficulty arising out of a deep-seated chair without the use of the hands. The patient's stride length is good with good arm swing bilaterally.  He is flexed at waist  I have reviewed and interpreted the following labs independently   Lab Results  Component Value Date   TSH 3.609 01/28/2022   Lab Results  Component Value Date   VITAMINB12 216 06/15/2021      Cc:  Mayra Neer, MD

## 2022-08-10 ENCOUNTER — Inpatient Hospital Stay: Payer: Medicare Other | Attending: Physician Assistant

## 2022-08-10 ENCOUNTER — Other Ambulatory Visit (HOSPITAL_BASED_OUTPATIENT_CLINIC_OR_DEPARTMENT_OTHER): Payer: Self-pay

## 2022-08-10 ENCOUNTER — Other Ambulatory Visit: Payer: Self-pay

## 2022-08-10 ENCOUNTER — Encounter: Payer: Self-pay | Admitting: Hematology

## 2022-08-10 ENCOUNTER — Inpatient Hospital Stay (HOSPITAL_BASED_OUTPATIENT_CLINIC_OR_DEPARTMENT_OTHER): Payer: Medicare Other | Admitting: Hematology

## 2022-08-10 VITALS — BP 133/82 | HR 63 | Temp 97.7°F | Resp 16 | Wt 160.2 lb

## 2022-08-10 DIAGNOSIS — Z79899 Other long term (current) drug therapy: Secondary | ICD-10-CM | POA: Insufficient documentation

## 2022-08-10 DIAGNOSIS — K6389 Other specified diseases of intestine: Secondary | ICD-10-CM

## 2022-08-10 DIAGNOSIS — R1907 Generalized intra-abdominal and pelvic swelling, mass and lump: Secondary | ICD-10-CM | POA: Insufficient documentation

## 2022-08-10 DIAGNOSIS — Z9049 Acquired absence of other specified parts of digestive tract: Secondary | ICD-10-CM | POA: Insufficient documentation

## 2022-08-10 LAB — CMP (CANCER CENTER ONLY)
ALT: 8 U/L (ref 0–44)
AST: 14 U/L — ABNORMAL LOW (ref 15–41)
Albumin: 4 g/dL (ref 3.5–5.0)
Alkaline Phosphatase: 82 U/L (ref 38–126)
Anion gap: 3 — ABNORMAL LOW (ref 5–15)
BUN: 15 mg/dL (ref 8–23)
CO2: 30 mmol/L (ref 22–32)
Calcium: 8.9 mg/dL (ref 8.9–10.3)
Chloride: 106 mmol/L (ref 98–111)
Creatinine: 1.04 mg/dL (ref 0.61–1.24)
GFR, Estimated: >60 mL/min (ref >60)
Glucose, Bld: 116 mg/dL — ABNORMAL HIGH (ref 70–99)
Potassium: 4.7 mmol/L (ref 3.5–5.1)
Sodium: 139 mmol/L (ref 135–145)
Total Bilirubin: 0.6 mg/dL (ref 0.3–1.2)
Total Protein: 6.6 g/dL (ref 6.5–8.1)

## 2022-08-10 LAB — CBC WITH DIFFERENTIAL (CANCER CENTER ONLY)
Abs Immature Granulocytes: 0.03 10*3/uL (ref 0.00–0.07)
Basophils Absolute: 0 10*3/uL (ref 0.0–0.1)
Basophils Relative: 1 %
Eosinophils Absolute: 0.1 10*3/uL (ref 0.0–0.5)
Eosinophils Relative: 1 %
HCT: 39.5 % (ref 39.0–52.0)
Hemoglobin: 13.9 g/dL (ref 13.0–17.0)
Immature Granulocytes: 1 %
Lymphocytes Relative: 14 %
Lymphs Abs: 0.9 10*3/uL (ref 0.7–4.0)
MCH: 32.3 pg (ref 26.0–34.0)
MCHC: 35.2 g/dL (ref 30.0–36.0)
MCV: 91.6 fL (ref 80.0–100.0)
Monocytes Absolute: 0.4 10*3/uL (ref 0.1–1.0)
Monocytes Relative: 6 %
Neutro Abs: 5 10*3/uL (ref 1.7–7.7)
Neutrophils Relative %: 77 %
Platelet Count: 238 10*3/uL (ref 150–400)
RBC: 4.31 MIL/uL (ref 4.22–5.81)
RDW: 11.7 % (ref 11.5–15.5)
WBC Count: 6.4 10*3/uL (ref 4.0–10.5)
nRBC: 0 % (ref 0.0–0.2)

## 2022-08-10 MED ORDER — COMIRNATY 30 MCG/0.3ML IM SUSY
PREFILLED_SYRINGE | INTRAMUSCULAR | 0 refills | Status: DC
  Filled 2022-08-10: qty 0.3, 1d supply, fill #0

## 2022-08-10 NOTE — Progress Notes (Signed)
The proposed treatment discussed in conference is for discussion purpose only and is not a binding recommendation.  The patients have not been physically examined, or presented with their treatment options.  Therefore, final treatment plans cannot be decided.  

## 2022-08-10 NOTE — Progress Notes (Signed)
[facility]   Telephone:([phone] Fax:([phone]   Clinic Follow up Note   Patient Care Team: Mayra Neer, MD as PCP - General (Family Medicine) Tat, Eustace Quail, DO as Consulting Physician (Neurology) Truitt Merle, MD as Consulting Physician (Oncology) Royston Bake, RN as Oncology Nurse Navigator (Oncology)  Date of Service:  08/10/2022  CHIEF COMPLAINT: f/u of mesenteric mass  ASSESSMENT & PLAN:  Barry Pope is a 76 y.o. male with   1. 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 -presented with acute abdominal pain. CT AP 01/27/22 showed: 2.6 cm calcified mesenteric mass in left mid abdomen. -chromogranin A obtained 07/08/22 and 24hr urine obtained 07/12/22 were both WNL. -DOTATATE PET scan was obtained 07/29/22 to rule out recurrent NET. This was negative.  -I reviewed the results with them today. I explained the scan shows he has no evidence of recurrence of his prior NET. Given this and his normal chromogranin A and 24hr urine. We reviewed his case in GI tumor board this morning, we think this is likely benign, we do not have high suspicion for malignancy. This is difficult to biopsy. I recommend obtaining a repeat scan in 01/2022 for surveillance; I ordered today. -labs reviewed, overall WNL. He is clinically doing well   PLAN: -f/u in 6 months, with lab and CT several days before   No problem-specific Assessment & Plan notes found for this encounter.   INTERVAL HISTORY:  Barry Pope is here for a follow up of mesenteric mass. He was last seen by PA Murray Hodgkins on 07/09/22 in consultation. He presents to the clinic accompanied by his wife. He reports doing well overall. They tell me they recently returned from the beach, where it was much warmer. They deny sunburn, and his wife reports he has flushing.   All other systems were reviewed with the patient and are negative.  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  ALLERGIES:  has No Known Allergies.  MEDICATIONS:  Current Outpatient Medications  Medication Sig Dispense Refill   acyclovir (ZOVIRAX) 200 MG capsule 1 capusle Orally four times a day as needed 30 capsule 2   carbidopa-levodopa (SINEMET IR) 25-100 MG tablet Take 1 tablet by mouth 3 (three) times daily. 270 tablet 1   ibuprofen (ADVIL) 200 MG tablet Take 400 mg by mouth every 6 (six) hours as needed for mild pain.     sertraline (ZOLOFT) 50 MG tablet Take 1 tablet (50 mg total) by mouth every evening. 30 tablet 11   tamsulosin (FLOMAX) 0.4 MG CAPS  capsule Take 1 capsule Once a day in the evening for urinary frequency. 30 capsule 6   No current facility-administered medications for this visit.    PHYSICAL EXAMINATION: ECOG PERFORMANCE STATUS: 1 - Symptomatic but completely ambulatory  Vitals:   08/10/22 1123  BP: 133/82  Pulse: 63  Resp: 16  Temp: 97.7 F (36.5 C)  SpO2: 99%   Wt Readings from Last 3 Encounters:  08/10/22 160 lb 4 oz (72.7 kg)  07/08/22 159 lb 3.2 oz (72.2 kg)  02/04/22 157 lb 12.8 oz (71.6 kg)     GENERAL:alert, no distress and comfortable SKIN:  skin color normal, no rashes or significant lesions EYES: normal, Conjunctiva are pink and non-injected, sclera clear  NEURO: alert & oriented x 3 with fluent speech  LABORATORY DATA:  I have reviewed the data as listed    Latest Ref Rng & Units 08/10/2022   11:05 AM 07/08/2022   12:51 PM 01/30/2022    4:06 AM  CBC  WBC 4.0 - 10.5 K/uL 6.4  7.5  5.1   Hemoglobin 13.0 - 17.0 g/dL 13.9  14.6  14.1   Hematocrit 39.0 - 52.0 % 39.5  41.0  39.2   Platelets 150 - 400 K/uL 238  233  152         Latest Ref Rng & Units 08/10/2022   11:05 AM 07/08/2022   12:51 PM 01/30/2022    4:06 AM  CMP  Glucose 70 - 99 mg/dL 116  112  107   BUN 8 - 23 mg/dL '15  15  9   '$ Creatinine 0.61 - 1.24 mg/dL 1.04  0.93  1.01   Sodium 135 - 145 mmol/L 139  137  134   Potassium 3.5 - 5.1 mmol/L 4.7  4.7  4.0   Chloride 98 - 111 mmol/L 106  104  101   CO2 22 - 32 mmol/L '30  30  22   '$ Calcium 8.9 - 10.3 mg/dL 8.9  8.9  8.5   Total Protein 6.5 - 8.1 g/dL 6.6  6.9    Total Bilirubin 0.3 - 1.2 mg/dL 0.6  0.5    Alkaline Phos 38 - 126 U/L 82  99    AST 15 - 41 U/L 14  14    ALT 0 - 44 U/L 8  13        RADIOGRAPHIC STUDIES: I have personally reviewed the radiological images as listed and agreed with the findings in the report. No results found.    Orders Placed This Encounter  Procedures   CT ABDOMEN PELVIS W CONTRAST    Standing Status:   Future    Standing Expiration Date:   08/11/2023    Order Specific Question:   If indicated for the ordered procedure, I authorize the administration of contrast media per Radiology protocol    Answer:   Yes    Order Specific Question:   Preferred imaging location?    Answer:   [facility]    Order Specific Question:   Release to patient    Answer:   Immediate    Order Specific Question:   Is Oral Contrast requested for this exam?    Answer:   No oral contrast    Order Specific Question:   Reason for No Oral Contrast    Answer:   Other    Order Specific  Question:   Please answer why no oral contrast is requested    Answer:   poor tolerance  to oral contrast   All questions were answered. The patient knows to call the clinic with any problems, questions or concerns. No barriers to learning was detected. The total time spent in the appointment was 30 minutes.     Truitt Merle, MD 08/10/2022   I, Wilburn Mylar, am acting as scribe for Truitt Merle, MD.   I have reviewed the above documentation for accuracy and completeness, and I agree with the above.

## 2022-08-11 ENCOUNTER — Other Ambulatory Visit (HOSPITAL_BASED_OUTPATIENT_CLINIC_OR_DEPARTMENT_OTHER): Payer: Self-pay

## 2022-08-11 ENCOUNTER — Ambulatory Visit: Payer: Medicare Other | Admitting: Neurology

## 2022-08-11 ENCOUNTER — Encounter: Payer: Self-pay | Admitting: Neurology

## 2022-08-11 VITALS — BP 124/78 | HR 71 | Ht 69.0 in | Wt 160.6 lb

## 2022-08-11 DIAGNOSIS — G20B2 Parkinson's disease with dyskinesia, with fluctuations: Secondary | ICD-10-CM | POA: Diagnosis not present

## 2022-08-11 DIAGNOSIS — G2581 Restless legs syndrome: Secondary | ICD-10-CM

## 2022-08-11 MED ORDER — CARBIDOPA-LEVODOPA ER 50-200 MG PO TBCR
1.0000 | EXTENDED_RELEASE_TABLET | Freq: Every day | ORAL | 2 refills | Status: DC
  Filled 2022-08-11: qty 90, 90d supply, fill #0
  Filled 2022-11-02: qty 90, 90d supply, fill #1
  Filled 2023-02-01: qty 90, 90d supply, fill #2

## 2022-08-11 NOTE — Patient Instructions (Signed)
Take carbidopa/levodopa 25/100 at 7am/11am/4pm (Yellow pill) Take carbidopa/levodopa 50/200 CR at bedtime for restless leg   Local and Online Resources for Power over Parkinson's Group  October 2023    LOCAL [place] PARKINSON'S GROUPS   Power over Parkinson's Group:    Power Over Parkinson's Patient Education Group will be Wednesday, October 11th-*Hybrid meting*- in person at [place] location and via [facility] at 2 pm.   Upcoming Power over [employer] Meetings:  2nd Wednesdays of the month at 2 pm:   October 11th, November 8th, December 13th  Contact Amy Marriott at [email or web address] if interested in participating in this group    Kaka! Moves [employer] Instructor-Led Classes offering at [employer]!  TUESDAYS and Wednesdays 1-2 pm.   Contact Vonna Kotyk at  [email or web address] or Caron Presume at [place], [email or web address]  Dance for Parkinson 's classes will be on Tuesdays 9:30am-10:30am starting October 3-December 12 with a break the week of November 21st. Located in the [employer]  which is in the first floor of the [employer] (Ideal.) To register:  [email or web address] or [phone]  Drumming for Parkinson's will be held on 2nd and 4th Mondays at 11:00 am.   Located at the [place] ([place]  [place] at [email or web address] or [phone]  Through support from the [place] for Parkinson's classes are free for both patients and caregivers.    [facility] [employer] Parkinson's Tai Chi Class, Mondays at 11 am.  Call [phone] for details   [place]:  [email or web address]  PD Health at Home continues:  Mindfulness Mondays, Wellness Wednesdays, Fitness Fridays   Upcoming Education:    Parkinson's 101:  What you  and your family should know.  Wednesday, Oct. 4th 1-2 pm  Expert Briefing:     Parkinson's and the Gut-Brain Connection.  Wednesday, Oct. 11th 1-2 pm  Hallucinations and Delusions in Parkinson's.  Wednesday, Nov. 8th, 1-2 pm  Register for expert briefings (webinars) at [email or web address]  Please check out their website to sign up for emails and see their full online offerings      [place]:  [email or web address]   Third Thursday Webinars:  On the third Thursday of every month at 12 p.m. ET, join our free live webinars to learn about various aspects of living with Parkinson's disease and our work to speed medical breakthroughs.  Upcoming Webinar:  Surveyor, mining for [employer]. (Replay).  Thursday, Oct. 12th at 12 noon  Check out additional information on their website to see their full online offerings    [place]:  [email or web address]  Upcoming Webinar:   Stay tuned  Webinar Series:  Living with Parkinson's Meetup.   Third Thursdays each month, 3 pm  Care Partner Monthly Meetup.  With Robin Searing [place].  First Tuesday of each month, 2 pm  Check out additional information to Live Well Today on their website    Parkinson and Movement Disorders (PMD) Alliance:  www.[email or web address]  [employer]Life Online:  Online Education Events  Sign up for emails, which are sent weekly to give you updates on programming and online offerings    Parkinson's Association of the [place]:  www.[email or web address]  Information on online support groups, education events, and online exercises including Yoga, Parkinson's exercises and more-LOTS of information on links to PD resources and online events  Virtual Support  Group through Parkinson's Association of the [facility]; next one is scheduled for Wednesday, October 4th at 2 pm.  (These are typically scheduled for the  1st Wednesday of the month at 2 pm).  Visit website for details.   MOVEMENT AND EXERCISE OPPORTUNITIES  PWR! Moves Classes at [place].  Wednesdays 10 and 11 am.   Contact Amy Marriott, PT [email or web address] if interested.  NEW PWR! Moves Class offerings at [employer].  *TUESDAYS* and Wednesdays 1-2 pm.  Contact Vonna Kotyk at  [email or web address] or Caron Presume at [place],  [email or web address]  Parkinson's Wellness Recovery (PWR! Moves)  [email or web address]  Info on the PWR! Virtual Experience:  You will have access to our expertise?through self-assessment, guided plans that start with the PD-specific fundamentals, educational content, tips, Q&A with an expert, and a growing Art therapist of PD-specific pre-recorded and live exercise classes of varying types and intensity - both physical and cognitive! If that is not enough, we offer 1:1 wellness consultations (in-person or virtual) to personalize your PWR! Research scientist (medical).   [place] Fridays:   As part of the PD Health @ Home program, this free video series focuses each week on one aspect of fitness designed to support people living with Parkinson's.? These weekly videos highlight the [place] fitness guidelines for people with Parkinson's disease.  [email or web address]  Dance for PD website is offering free, live-stream classes throughout the week, as well as links to [employer] of classes:  [email or web address]  Virtual dance and Pilates for Parkinson's classes: Click on the Community Tab> Parkinson's Movement Initiative Tab.  To register for classes and for more information, visit [email or web address] and click the "community" tab.   [employer] Parkinson's Cycling Classes   [facility] [employer]:  Thursdays @ Noon-Live classes at [employer] ([employer] at [email or web address]?or  [phone])  [facility]: Virtual Classes Mondays and Thursdays Jeanette Caprice classes Tuesday, Wednesday and Thursday (contact [place] at [email or web address] ?or [phone])  [place]  Varied levels of classes are offered Tuesdays and Thursdays at [employer].   Stretching with Verdis Frederickson weekly class is also offered for people with Parkinson's  To observe a class or for more information, call [phone] or email Hezzie Bump at [email or web address]   ADDITIONAL SUPPORT AND RESOURCES  [employer]:Online Caregiver Education Opportunities:  www.well-springsolutions.org/caregiver-education/caregiver-support-group.  You may also contact Vickki Muff at [email or web address] or [phone].     Well-Spring Navigator:  Just1Navigator program, a?free service to help individuals and families through the journey of determining care for older adults.  The "Navigator" is a Education officer, museum, Arnell Asal, who will speak with a prospective client and/or loved ones to provide an assessment of the situation and a set of recommendations for a personalized care plan -- all free of charge, and whether?[employer] offers the needed service or not. If the need is not a service we provide, we are well-connected with reputable programs in town that we can refer you to.  www.well-springsolutions.org or to speak with the Navigator, call [phone].

## 2022-08-12 ENCOUNTER — Other Ambulatory Visit (HOSPITAL_BASED_OUTPATIENT_CLINIC_OR_DEPARTMENT_OTHER): Payer: Self-pay

## 2022-09-06 ENCOUNTER — Other Ambulatory Visit (HOSPITAL_BASED_OUTPATIENT_CLINIC_OR_DEPARTMENT_OTHER): Payer: Self-pay

## 2022-09-13 ENCOUNTER — Other Ambulatory Visit: Payer: Self-pay

## 2022-09-23 IMAGING — MR MR HEAD W/O CM
12 of 13 series · 44 of 48 positions shown · non-contrast
Comparison: Head CT 01/27/2022. Brain MRI 01/02/2020.

CLINICAL DATA: Provided history: Encephalopathy.

EXAM:
MRI HEAD WITHOUT CONTRAST
TECHNIQUE: Multiplanar, multiecho pulse sequences of the brain and surrounding
structures were obtained without intravenous contrast.

[Series 5: DWI · axial · 3.0mm · 0.88mm/px · z∈[-62,+96]mm · 7 of 108 slices shown (1 of 4)]
[im 1/108]
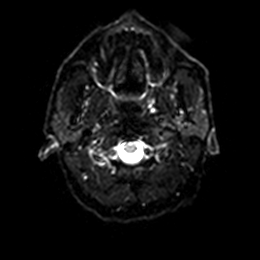
[im 18/108]
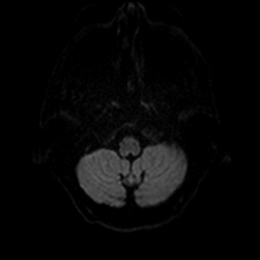
[im 36/108]
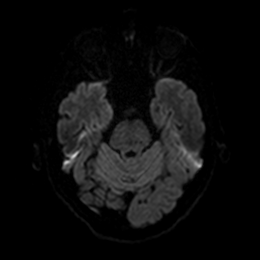
[im 54/108]
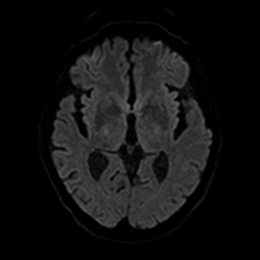
[im 72/108]
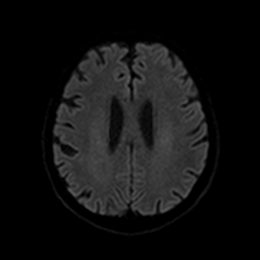
[im 90/108]
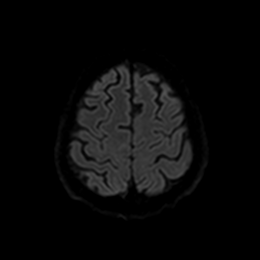
[im 108/108]
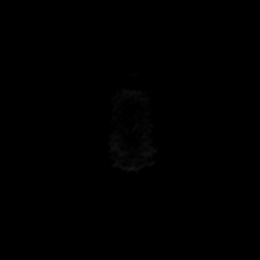

[Series 6: DWI · axial · 3.0mm · 0.88mm/px · z∈[-62,+93]mm · 4 of 52 slices shown (2 of 4)]
[im 1/52]
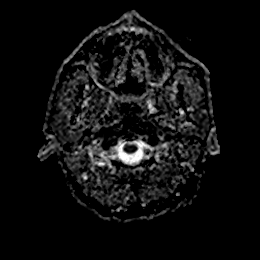
[im 18/52]
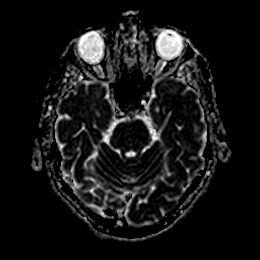
[im 35/52]
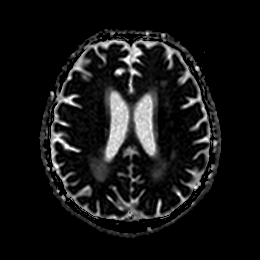
[im 52/52]
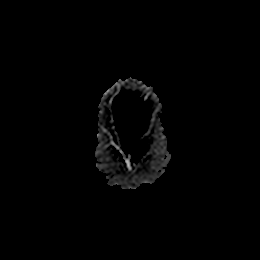

[Series 7: DWI · coronal · 4.0mm · 0.88mm/px · 6 of 76 slices shown (3 of 4)]
[im 1/76]
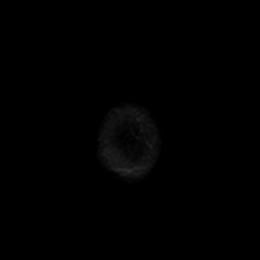
[im 16/76]
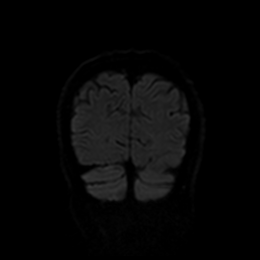
[im 31/76]
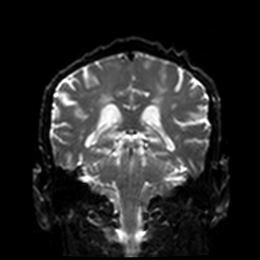
[im 46/76]
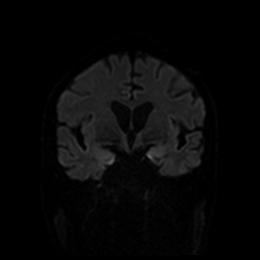
[im 61/76]
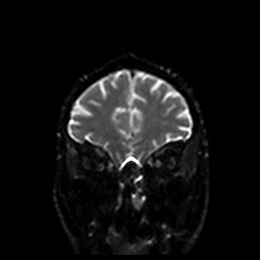
[im 76/76]
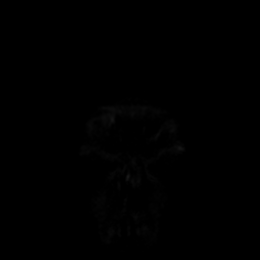

[Series 8: DWI · coronal · 4.0mm · 0.88mm/px · 3 of 38 slices shown (4 of 4)]
[im 1/38]
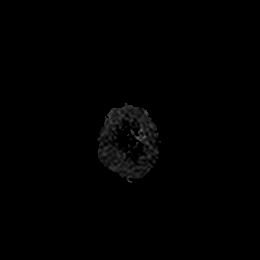
[im 19/38]
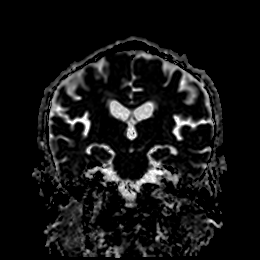
[im 38/38]
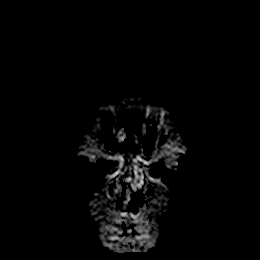

[Series 9: T1 · sagittal · 5.0mm · 0.75mm/px · 2 of 25 slices shown]
[im 1/25]
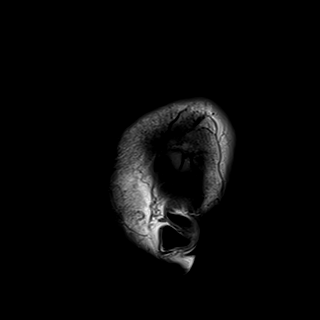
[im 25/25]
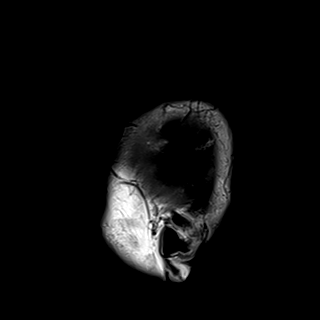

[Series 10: T2 · axial · 5.0mm · 0.72mm/px · z∈[-61,+95]mm · 2 of 27 slices shown (1 of 2)]
[im 1/27]
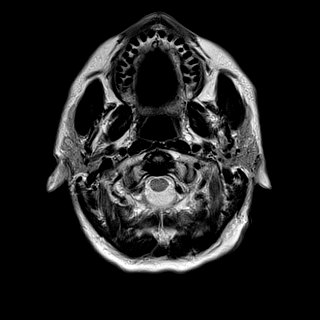
[im 27/27]
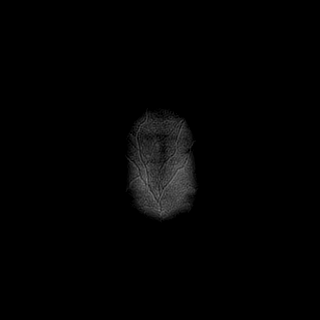

[Series 11: FLAIR · axial · 5.0mm · 0.45mm/px · z∈[-62,+93]mm · 2 of 27 slices shown]
[im 1/27]
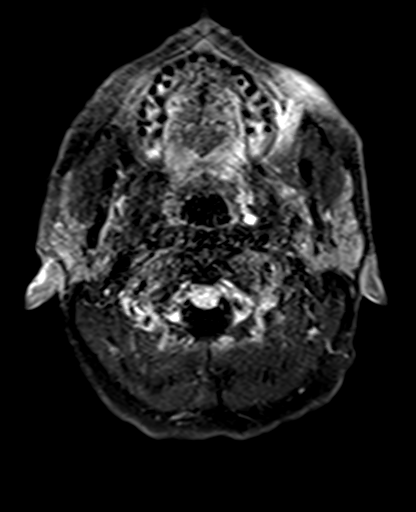
[im 27/27]
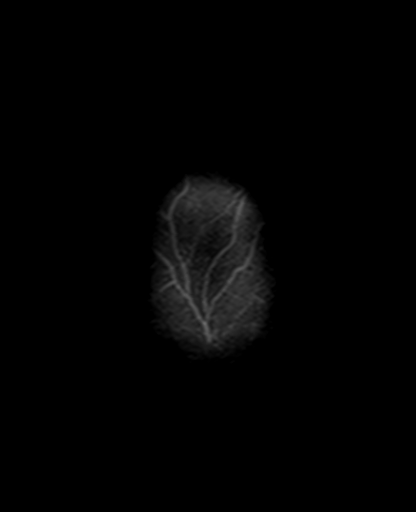

[Series 12: mag_images · axial · 3.0mm · 0.90mm/px · z∈[-67,+98]mm · 4 of 56 slices shown]
[im 1/56]
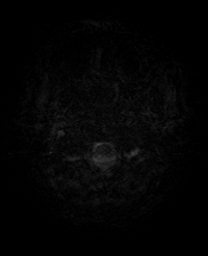
[im 19/56]
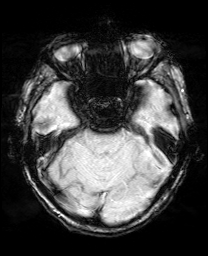
[im 37/56]
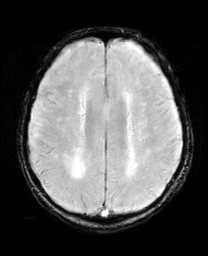
[im 56/56]
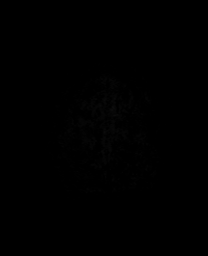

[Series 13: pha_images · axial · 3.0mm · 0.90mm/px · z∈[-67,+98]mm · 4 of 56 slices shown]
[im 1/56]
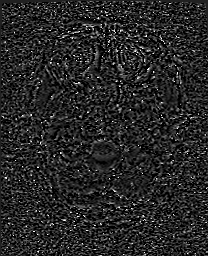
[im 19/56]
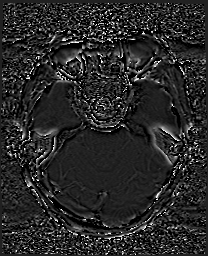
[im 37/56]
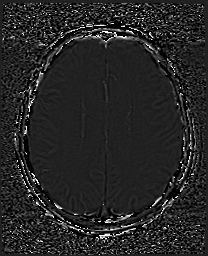
[im 56/56]
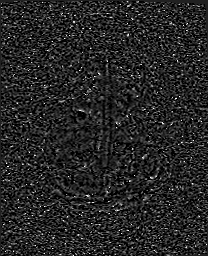

[Series 14: swi_images · axial · 3.0mm · 0.90mm/px · z∈[-67,+98]mm · 4 of 56 slices shown]
[im 1/56]
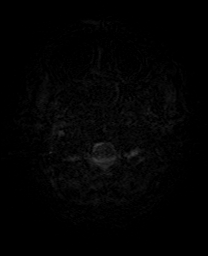
[im 19/56]
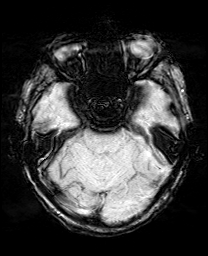
[im 37/56]
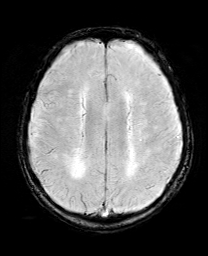
[im 56/56]
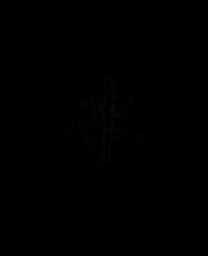

[Series 15: mip_images(sw) · axial · 24.0mm · 0.90mm/px · z∈[-56,+87]mm · 4 of 49 slices shown]
[im 1/49]
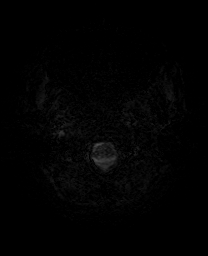
[im 17/49]
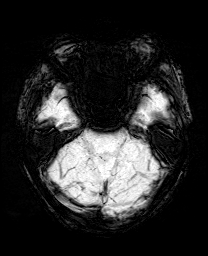
[im 33/49]
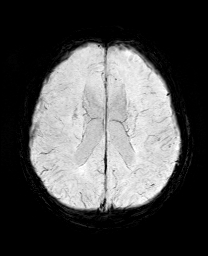
[im 49/49]
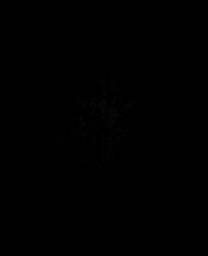

[Series 17: T2 · coronal · 5.0mm · 0.34mm/px · 2 of 31 slices shown (2 of 2)]
[im 1/31]
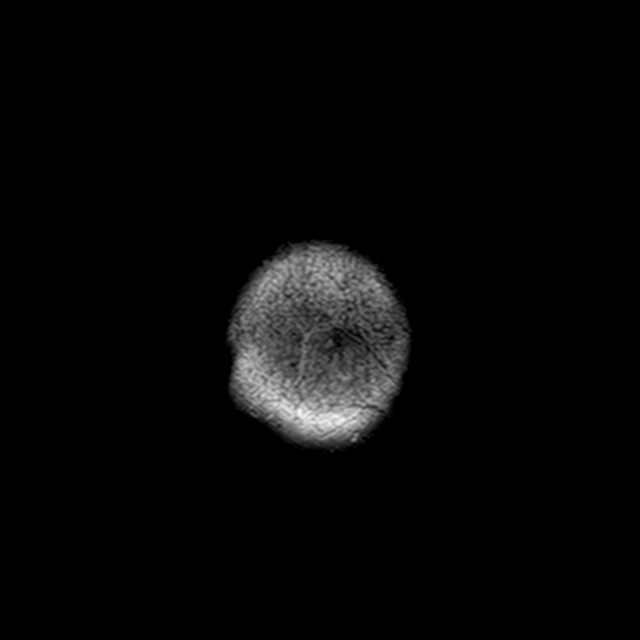
[im 31/31]
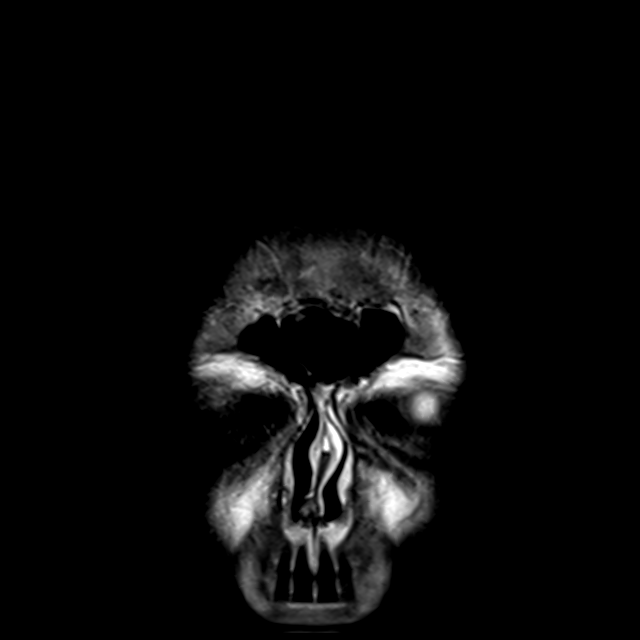

[44 of 48 positions shown; findings below may reference images not displayed]

FINDINGS: Mild intermittent motion degradation.

Brain:

Mild generalized parenchymal atrophy.

Mild-to-moderate multifocal T2 FLAIR hyperintense signal abnormality
within the cerebral white matter, nonspecific but compatible with
chronic small vessel ischemic disease.

A 2 mm nodular lesion is questioned along the course of the cochlear
nerve within the right internal auditory canal (appreciated on the
coronal T2 TSE sequence only, series 17, image 15).

There is no acute infarct.

No chronic intracranial blood products.

No extra-axial fluid collection.

No midline shift.

Vascular: Maintained flow voids within the proximal large arterial
vessels.

Skull and upper cervical spine: No focal suspicious marrow lesion.

Sinuses/Orbits: Visualized orbits show no acute finding. Mild
mucosal thickening within the bilateral ethmoid sinuses.
IMPRESSION: No evidence of acute infarction.

A 2 mm nodular lesion is questioned along the course of the cochlear
nerve within the right internal auditory canal (versus volume
averaging of the bony IAC). This could reflect a small vestibular
schwannoma, and a non-emergent internal auditory canal protocol
brain MRI with contrast is recommended for further evaluation.

Mild-to-moderate chronic small vessel ischemic changes within the
cerebral white matter, similar to the prior brain MRI of 01/02/2020.

Mild generalized parenchymal atrophy.

Mild mucosal thickening within the bilateral ethmoid sinuses.

## 2022-09-24 ENCOUNTER — Other Ambulatory Visit (HOSPITAL_BASED_OUTPATIENT_CLINIC_OR_DEPARTMENT_OTHER): Payer: Self-pay

## 2022-10-08 ENCOUNTER — Other Ambulatory Visit (HOSPITAL_COMMUNITY): Payer: Self-pay

## 2022-10-11 ENCOUNTER — Other Ambulatory Visit: Payer: Self-pay

## 2022-10-12 ENCOUNTER — Other Ambulatory Visit: Payer: Self-pay

## 2022-10-13 ENCOUNTER — Other Ambulatory Visit (HOSPITAL_BASED_OUTPATIENT_CLINIC_OR_DEPARTMENT_OTHER): Payer: Self-pay

## 2022-11-02 ENCOUNTER — Other Ambulatory Visit: Payer: Self-pay

## 2022-11-02 ENCOUNTER — Other Ambulatory Visit (HOSPITAL_BASED_OUTPATIENT_CLINIC_OR_DEPARTMENT_OTHER): Payer: Self-pay

## 2022-11-04 ENCOUNTER — Other Ambulatory Visit (HOSPITAL_BASED_OUTPATIENT_CLINIC_OR_DEPARTMENT_OTHER): Payer: Self-pay

## 2022-11-05 ENCOUNTER — Other Ambulatory Visit (HOSPITAL_BASED_OUTPATIENT_CLINIC_OR_DEPARTMENT_OTHER): Payer: Self-pay

## 2022-11-15 ENCOUNTER — Other Ambulatory Visit (HOSPITAL_BASED_OUTPATIENT_CLINIC_OR_DEPARTMENT_OTHER): Payer: Self-pay

## 2022-12-01 ENCOUNTER — Other Ambulatory Visit (HOSPITAL_BASED_OUTPATIENT_CLINIC_OR_DEPARTMENT_OTHER): Payer: Self-pay

## 2022-12-01 ENCOUNTER — Other Ambulatory Visit: Payer: Self-pay | Admitting: Neurology

## 2022-12-02 ENCOUNTER — Other Ambulatory Visit (HOSPITAL_BASED_OUTPATIENT_CLINIC_OR_DEPARTMENT_OTHER): Payer: Self-pay

## 2022-12-02 MED ORDER — CARBIDOPA-LEVODOPA 25-100 MG PO TABS
1.0000 | ORAL_TABLET | Freq: Three times a day (TID) | ORAL | 1 refills | Status: DC
  Filled 2022-12-02: qty 270, 90d supply, fill #0

## 2022-12-06 ENCOUNTER — Other Ambulatory Visit (HOSPITAL_BASED_OUTPATIENT_CLINIC_OR_DEPARTMENT_OTHER): Payer: Self-pay

## 2022-12-07 ENCOUNTER — Other Ambulatory Visit (HOSPITAL_BASED_OUTPATIENT_CLINIC_OR_DEPARTMENT_OTHER): Payer: Self-pay

## 2022-12-12 ENCOUNTER — Other Ambulatory Visit (HOSPITAL_BASED_OUTPATIENT_CLINIC_OR_DEPARTMENT_OTHER): Payer: Self-pay
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
## 2022-12-14 ENCOUNTER — Other Ambulatory Visit (HOSPITAL_BASED_OUTPATIENT_CLINIC_OR_DEPARTMENT_OTHER): Payer: Self-pay

## 2022-12-14 DIAGNOSIS — G4752 REM sleep behavior disorder: Secondary | ICD-10-CM | POA: Diagnosis not present

## 2022-12-14 DIAGNOSIS — Z0000 Encounter for general adult medical examination without abnormal findings: Secondary | ICD-10-CM | POA: Diagnosis not present

## 2022-12-14 DIAGNOSIS — G3184 Mild cognitive impairment, so stated: Secondary | ICD-10-CM | POA: Diagnosis not present

## 2022-12-14 DIAGNOSIS — E538 Deficiency of other specified B group vitamins: Secondary | ICD-10-CM | POA: Diagnosis not present

## 2022-12-14 DIAGNOSIS — D696 Thrombocytopenia, unspecified: Secondary | ICD-10-CM | POA: Diagnosis not present

## 2022-12-14 DIAGNOSIS — Z79899 Other long term (current) drug therapy: Secondary | ICD-10-CM | POA: Diagnosis not present

## 2022-12-14 DIAGNOSIS — R7309 Other abnormal glucose: Secondary | ICD-10-CM | POA: Diagnosis not present

## 2022-12-14 MED ORDER — SERTRALINE HCL 100 MG PO TABS
100.0000 mg | ORAL_TABLET | Freq: Every day | ORAL | 3 refills | Status: DC
  Filled 2022-12-14: qty 90, 90d supply, fill #0
  Filled 2023-03-13: qty 90, 90d supply, fill #1
  Filled 2023-06-08: qty 90, 90d supply, fill #2
  Filled 2023-09-18: qty 90, 90d supply, fill #3

## 2022-12-15 ENCOUNTER — Ambulatory Visit: Payer: Medicare Other | Admitting: Neurology

## 2022-12-19 ENCOUNTER — Other Ambulatory Visit (HOSPITAL_BASED_OUTPATIENT_CLINIC_OR_DEPARTMENT_OTHER): Payer: Self-pay

## 2022-12-19 ENCOUNTER — Encounter: Payer: Self-pay | Admitting: Neurology

## 2022-12-19 ENCOUNTER — Ambulatory Visit: Payer: Medicare Other | Admitting: Neurology

## 2022-12-19 VITALS — BP 130/70 | HR 68 | Ht 69.0 in | Wt 166.0 lb

## 2022-12-19 DIAGNOSIS — G2581 Restless legs syndrome: Secondary | ICD-10-CM | POA: Diagnosis not present

## 2022-12-19 DIAGNOSIS — G4752 REM sleep behavior disorder: Secondary | ICD-10-CM

## 2022-12-19 DIAGNOSIS — G20A1 Parkinson's disease without dyskinesia, without mention of fluctuations: Secondary | ICD-10-CM

## 2022-12-19 MED ORDER — CARBIDOPA-LEVODOPA 25-100 MG PO TABS
1.5000 | ORAL_TABLET | Freq: Three times a day (TID) | ORAL | 1 refills | Status: DC
  Filled 2022-12-19 – 2023-02-01 (×5): qty 405, 90d supply, fill #0
  Filled 2023-04-17: qty 405, 90d supply, fill #1

## 2022-12-19 NOTE — Patient Instructions (Signed)
Increase carbidopa/levodopa 25/100, 1.5 tabs at 7am/11am/4pm.  Can take an extra at bed if needed every few weeks Continue carbidopa/levodopa 50/200 at bedtime

## 2022-12-20 ENCOUNTER — Other Ambulatory Visit (HOSPITAL_BASED_OUTPATIENT_CLINIC_OR_DEPARTMENT_OTHER): Payer: Self-pay

## 2022-12-22 ENCOUNTER — Other Ambulatory Visit (HOSPITAL_BASED_OUTPATIENT_CLINIC_OR_DEPARTMENT_OTHER): Payer: Self-pay

## 2022-12-28 ENCOUNTER — Other Ambulatory Visit: Payer: Self-pay

## 2022-12-31 ENCOUNTER — Other Ambulatory Visit (HOSPITAL_BASED_OUTPATIENT_CLINIC_OR_DEPARTMENT_OTHER): Payer: Self-pay

## 2023-01-02 ENCOUNTER — Other Ambulatory Visit (HOSPITAL_BASED_OUTPATIENT_CLINIC_OR_DEPARTMENT_OTHER): Payer: Self-pay

## 2023-01-02 MED ORDER — TRAZODONE HCL 50 MG PO TABS
25.0000 mg | ORAL_TABLET | Freq: Every evening | ORAL | 0 refills | Status: DC | PRN
  Filled 2023-01-02: qty 90, 90d supply, fill #0

## 2023-01-10 ENCOUNTER — Other Ambulatory Visit (HOSPITAL_BASED_OUTPATIENT_CLINIC_OR_DEPARTMENT_OTHER): Payer: Self-pay

## 2023-01-16 ENCOUNTER — Other Ambulatory Visit (HOSPITAL_BASED_OUTPATIENT_CLINIC_OR_DEPARTMENT_OTHER): Payer: Self-pay

## 2023-01-17 ENCOUNTER — Other Ambulatory Visit (HOSPITAL_BASED_OUTPATIENT_CLINIC_OR_DEPARTMENT_OTHER): Payer: Self-pay

## 2023-01-18 DIAGNOSIS — L218 Other seborrheic dermatitis: Secondary | ICD-10-CM | POA: Diagnosis not present

## 2023-01-18 DIAGNOSIS — D2262 Melanocytic nevi of left upper limb, including shoulder: Secondary | ICD-10-CM | POA: Diagnosis not present

## 2023-01-18 DIAGNOSIS — D225 Melanocytic nevi of trunk: Secondary | ICD-10-CM | POA: Diagnosis not present

## 2023-01-18 DIAGNOSIS — Z85828 Personal history of other malignant neoplasm of skin: Secondary | ICD-10-CM | POA: Diagnosis not present

## 2023-01-26 ENCOUNTER — Other Ambulatory Visit (HOSPITAL_BASED_OUTPATIENT_CLINIC_OR_DEPARTMENT_OTHER): Payer: Self-pay

## 2023-01-26 MED ORDER — TAMSULOSIN HCL 0.4 MG PO CAPS
0.4000 mg | ORAL_CAPSULE | Freq: Every evening | ORAL | 2 refills | Status: DC
  Filled 2023-01-26 – 2023-02-02 (×3): qty 30, 30d supply, fill #0
  Filled 2023-02-25 (×2): qty 30, 30d supply, fill #1
  Filled 2023-04-08: qty 30, 30d supply, fill #2

## 2023-01-27 ENCOUNTER — Telehealth: Payer: Self-pay

## 2023-01-27 ENCOUNTER — Other Ambulatory Visit: Payer: Self-pay

## 2023-01-27 ENCOUNTER — Telehealth: Payer: Self-pay | Admitting: Neurology

## 2023-01-27 ENCOUNTER — Other Ambulatory Visit (HOSPITAL_BASED_OUTPATIENT_CLINIC_OR_DEPARTMENT_OTHER): Payer: Self-pay

## 2023-01-27 NOTE — Telephone Encounter (Signed)
[facility] is calling asking for an updated prescription to be called in, was advised that the dosage was changed at last visit.

## 2023-01-27 NOTE — Telephone Encounter (Signed)
Called and talked with [name] and she didn't know that Mr. Batalla had called. We went over dosage and she understood.

## 2023-01-27 NOTE — Telephone Encounter (Signed)
Spoke to patients wife and she said that the refill is for his Flomax from Dr. Leandro Reasoner office and that it was a mistake to call our office

## 2023-01-27 NOTE — Telephone Encounter (Signed)
Patient left a Voicemail  about his medication Carbidopa levodopa. He wants to know about Dosage

## 2023-01-27 NOTE — Telephone Encounter (Signed)
Called [facility] they need a RX for Carbidopa levodopa

## 2023-02-01 ENCOUNTER — Other Ambulatory Visit (HOSPITAL_BASED_OUTPATIENT_CLINIC_OR_DEPARTMENT_OTHER): Payer: Self-pay

## 2023-02-02 ENCOUNTER — Inpatient Hospital Stay: Payer: Medicare Other | Attending: Nurse Practitioner

## 2023-02-02 ENCOUNTER — Other Ambulatory Visit: Payer: Self-pay

## 2023-02-02 ENCOUNTER — Ambulatory Visit (HOSPITAL_COMMUNITY)
Admission: RE | Admit: 2023-02-02 | Discharge: 2023-02-02 | Disposition: A | Discharge status: Home / Self Care | Payer: Medicare Other | Source: Ambulatory Visit | Attending: Hematology | Admitting: Hematology

## 2023-02-02 ENCOUNTER — Other Ambulatory Visit (HOSPITAL_BASED_OUTPATIENT_CLINIC_OR_DEPARTMENT_OTHER): Payer: Self-pay

## 2023-02-02 DIAGNOSIS — K6389 Other specified diseases of intestine: Secondary | ICD-10-CM | POA: Insufficient documentation

## 2023-02-02 DIAGNOSIS — Z9049 Acquired absence of other specified parts of digestive tract: Secondary | ICD-10-CM | POA: Insufficient documentation

## 2023-02-02 DIAGNOSIS — Z79899 Other long term (current) drug therapy: Secondary | ICD-10-CM | POA: Diagnosis not present

## 2023-02-02 DIAGNOSIS — Z85030 Personal history of malignant carcinoid tumor of large intestine: Secondary | ICD-10-CM | POA: Diagnosis not present

## 2023-02-02 DIAGNOSIS — G20A1 Parkinson's disease without dyskinesia, without mention of fluctuations: Secondary | ICD-10-CM | POA: Insufficient documentation

## 2023-02-02 DIAGNOSIS — R1901 Right upper quadrant abdominal swelling, mass and lump: Secondary | ICD-10-CM | POA: Insufficient documentation

## 2023-02-02 DIAGNOSIS — N400 Enlarged prostate without lower urinary tract symptoms: Secondary | ICD-10-CM | POA: Insufficient documentation

## 2023-02-02 LAB — CMP (CANCER CENTER ONLY)
ALT: <5 U/L (ref 0–44)
AST: 14 U/L — ABNORMAL LOW (ref 15–41)
Albumin: 4.2 g/dL (ref 3.5–5.0)
Alkaline Phosphatase: 83 U/L (ref 38–126)
Anion gap: 4 — ABNORMAL LOW (ref 5–15)
BUN: 18 mg/dL (ref 8–23)
CO2: 28 mmol/L (ref 22–32)
Calcium: 9.2 mg/dL (ref 8.9–10.3)
Chloride: 107 mmol/L (ref 98–111)
Creatinine: 1.12 mg/dL (ref 0.61–1.24)
GFR, Estimated: >60 mL/min (ref >60)
Glucose, Bld: 94 mg/dL (ref 70–99)
Potassium: 4.3 mmol/L (ref 3.5–5.1)
Sodium: 139 mmol/L (ref 135–145)
Total Bilirubin: 0.5 mg/dL (ref 0.3–1.2)
Total Protein: 6.4 g/dL — ABNORMAL LOW (ref 6.5–8.1)

## 2023-02-02 LAB — CBC WITH DIFFERENTIAL (CANCER CENTER ONLY)
Abs Immature Granulocytes: 0.03 10*3/uL (ref 0.00–0.07)
Basophils Absolute: 0 10*3/uL (ref 0.0–0.1)
Basophils Relative: 0 %
Eosinophils Absolute: 0.1 10*3/uL (ref 0.0–0.5)
Eosinophils Relative: 1 %
HCT: 38.4 % — ABNORMAL LOW (ref 39.0–52.0)
Hemoglobin: 14 g/dL (ref 13.0–17.0)
Immature Granulocytes: 0 %
Lymphocytes Relative: 15 %
Lymphs Abs: 1.3 10*3/uL (ref 0.7–4.0)
MCH: 33.2 pg (ref 26.0–34.0)
MCHC: 36.5 g/dL — ABNORMAL HIGH (ref 30.0–36.0)
MCV: 91 fL (ref 80.0–100.0)
Monocytes Absolute: 0.5 10*3/uL (ref 0.1–1.0)
Monocytes Relative: 6 %
Neutro Abs: 6.3 10*3/uL (ref 1.7–7.7)
Neutrophils Relative %: 78 %
Platelet Count: 213 10*3/uL (ref 150–400)
RBC: 4.22 MIL/uL (ref 4.22–5.81)
RDW: 11.6 % (ref 11.5–15.5)
WBC Count: 8.2 10*3/uL (ref 4.0–10.5)
nRBC: 0 % (ref 0.0–0.2)

## 2023-02-02 MED ORDER — IOHEXOL 300 MG/ML  SOLN
100.0000 mL | Freq: Once | INTRAMUSCULAR | Status: AC | PRN
  Administered 2023-02-02: 100 mL via INTRAVENOUS

## 2023-02-03 ENCOUNTER — Encounter: Payer: Self-pay | Admitting: Hematology

## 2023-02-03 ENCOUNTER — Inpatient Hospital Stay: Payer: Medicare Other | Admitting: Hematology

## 2023-02-03 DIAGNOSIS — Z859 Personal history of malignant neoplasm, unspecified: Secondary | ICD-10-CM | POA: Diagnosis not present

## 2023-02-03 NOTE — Assessment & Plan Note (Signed)
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 -presented with acute abdominal pain. CT AP 01/27/22 showed: 2.6 cm calcified mesenteric mass in left mid abdomen. -chromogranin A obtained 07/08/22 and 24hr urine obtained 07/12/22 were both WNL. -DOTATATE PET scan was obtained 07/29/22 to rule out recurrent NET. This was negative.  -I reviewed the results with them today. I explained the scan shows he has no evidence of recurrence of his prior NET. Given this and his normal chromogranin A and 24hr urine. We reviewed his case in GI tumor board this morning, we think this is likely benign, we do not have high suspicion for malignancy. This is difficult to biopsy. I recommend surveillance -CT scan from yesterday reviewed personally, which showed stable calcified mesenteric mass measures 2.6 x 1.4 cm, no other evidence of metastasis or recurrence.

## 2023-02-03 NOTE — Progress Notes (Signed)
[facility]   [phone] Fax:([phone]   Clinic Follow up Note   Patient Care Team: [name], MD as PCP - General (Family Medicine) Tat, Octaviano Batty, DO as Consulting Physician (Neurology) Malachy Mood, MD as Consulting Physician (Oncology)  Date of Service:  02/03/2023  I connected with Barry Pope on 02/03/2023 at  1:00 PM EDT by telephone visit and verified that I am speaking with the correct person using two identifiers.  I discussed the limitations, risks, security and privacy concerns of performing an evaluation and management service by telephone and the availability of in person appointments. I also discussed with the patient that there may be a patient responsible charge related to this service. The patient expressed understanding and agreed to proceed.   Other persons participating in the visit and their role in the encounter:  Wife  Patient's location:  Home Provider's location:  Office  CHIEF COMPLAINT: f/u of mesenteric mass   CURRENT THERAPY:  Surveillance  ASSESSMENT & PLAN: Barry Pope is a 77 y.o. 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 -presented with acute abdominal pain. CT AP 01/27/22 showed: 2.6 cm calcified mesenteric mass in left mid abdomen. -chromogranin A obtained 07/08/22 and 24hr urine obtained 07/12/22 were both WNL. -DOTATATE PET scan was obtained 07/29/22 to rule out recurrent NET. This was negative.  -I reviewed the results with them today. I explained the scan shows he has no evidence of recurrence of his prior NET. Given this and his normal chromogranin A and 24hr urine. We reviewed his case in GI tumor board this morning, we think this is likely benign, we do not have high suspicion for malignancy. This is difficult to biopsy. I recommend surveillance -CT scan from yesterday reviewed personally, which showed stable  calcified mesenteric mass measures 2.6 x 1.4 cm, no other evidence of metastasis or recurrence.  Given the stable appearance on scans, this is likely benign. -He is 20 years out of his initial diagnosis, no evidence of recurrence on recent scans, I will see him as needed.   PLAN: -discuss CT scan no  evidence of metastasis or recurrence.  His mesenteric mass is likely benign. -f/u as needed    INTERVAL HISTORY:  Barry Pope was contacted for a follow up of mesenteric mass . He was last seen by me on 08/10/2022. Pt wife stated there is nothing new, but for as his Parkinson disease it has increase.Pt wife denied he's having any abdomen issues but does have back pain.   All other systems were reviewed with the patient and are negative.  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  ALLERGIES:  has No Known Allergies.  MEDICATIONS:  Current Outpatient Medications  Medication Sig Dispense Refill   acyclovir (ZOVIRAX) 200 MG capsule 1 capusle Orally four times a day as needed 30 capsule 2   carbidopa-levodopa (SINEMET CR) 50-200 MG tablet Take 1 tablet by mouth at bedtime. 90 tablet 2    carbidopa-levodopa (SINEMET IR) 25-100 MG tablet Take 1.5 tablets by mouth 3 (three) times daily at 7am, 11am,  and 4pm.  Can take extra tablet at bedtime as needed 405 tablet 1   Cholecalciferol (VITAMIN D-3) 25 MCG (1000 UT) CAPS 1 capsule Orally Once a day for 30 day(s)     COVID-19 mRNA vaccine 2023-2024 (COMIRNATY) syringe Inject into the muscle. 0.3 mL 0   Cyanocobalamin 1000 MCG TBCR 1 tablet Orally Once a day     ibuprofen (ADVIL) 200 MG tablet Take 400 mg by mouth every 6 (six) hours as needed for mild pain.     sertraline (ZOLOFT) 100 MG tablet Take 1 tablet (100 mg total) by mouth daily. 90 tablet 3   tamsulosin (FLOMAX) 0.4 MG CAPS capsule Take 1 capsule by mouth in the evening for urinary frequency. 30 capsule 2   traZODone (DESYREL) 50 MG tablet Take 50 mg by mouth at bedtime.     traZODone (DESYREL) 50 MG tablet Take 0.5-1 tablets (25-50 mg total) by mouth at bedtime as needed. 90 tablet 0   No current facility-administered medications for this visit.    PHYSICAL EXAMINATION: ECOG PERFORMANCE STATUS: 2 - Symptomatic, <50% confined to bed  There were no vitals filed for this visit. Wt Readings from Last 3 Encounters:  12/19/22 166 lb (75.3 kg)  08/11/22 160 lb 9.6 oz (72.8 kg)  08/10/22 160 lb 4 oz (72.7 kg)     No vitals taken today, Exam not performed today  LABORATORY DATA:  I have reviewed the data as listed    Latest Ref Rng & Units 02/02/2023    1:31 PM 08/10/2022   11:05 AM 07/08/2022   12:51 PM  CBC  WBC 4.0 - 10.5 K/uL 8.2  6.4  7.5   Hemoglobin 13.0 - 17.0 g/dL 91.4  78.2  95.6   Hematocrit 39.0 - 52.0 % 38.4  39.5  41.0   Platelets 150 - 400 K/uL 213  238  233         Latest Ref Rng & Units 02/02/2023    1:31 PM 08/10/2022   11:05 AM 07/08/2022   12:51 PM  CMP  Glucose 70 - 99 mg/dL 94  213  086   BUN 8 - 23 mg/dL 18  15  15    Creatinine 0.61 - 1.24 mg/dL 5.78  4.69  6.29   Sodium 135 - 145 mmol/L 139  139  137   Potassium 3.5 - 5.1 mmol/L 4.3  4.7   4.7   Chloride 98 - 111 mmol/L 107  106  104   CO2 22 - 32 mmol/L 28  30  30    Calcium 8.9 - 10.3 mg/dL 9.2  8.9  8.9   Total Protein 6.5 - 8.1 g/dL 6.4  6.6  6.9   Total Bilirubin 0.3 - 1.2 mg/dL 0.5  0.6  0.5   Alkaline Phos 38 - 126 U/L 83  82  99   AST 15 - 41 U/L 14  14  14    ALT  0 - 44 U/L 5  8  13        RADIOGRAPHIC STUDIES: I have personally reviewed the radiological images as listed and agreed with the findings in the report. CT ABDOMEN PELVIS W CONTRAST  Result Date: 02/02/2023 CLINICAL DATA:  History of carcinoid tumor. EXAM: CT ABDOMEN AND PELVIS WITH CONTRAST TECHNIQUE: Multidetector CT imaging of the abdomen and pelvis was performed using the standard protocol following bolus administration of intravenous contrast. RADIATION DOSE REDUCTION: This exam was performed according to the departmental dose-optimization program which includes automated exposure control, adjustment of the mA and/or kV according to patient size and/or use of iterative reconstruction technique. CONTRAST:  OMNIPAQUE IOHEXOL 300 MG/ML  SOLN COMPARISON:  PET-CT August 06, 2022 FINDINGS: Lower chest: No acute abnormality. Hepatobiliary: No focal liver abnormality is seen. No gallstones, gallbladder wall thickening, or biliary dilatation. Pancreas: Unremarkable. No pancreatic ductal dilatation or surrounding inflammatory changes. Spleen: Normal in size without focal abnormality. Adrenals/Urinary Tract: Adrenal glands are unremarkable. Kidneys are normal, without renal calculi, focal lesion, or hydronephrosis. Bladder is unremarkable. Stomach/Bowel: Postoperative changes from ileocecectomy with enterocolic anastomosis which is intact. Vascular/Lymphatic: No significant vascular findings are present. No enlarged abdominal or pelvic lymph nodes. Reproductive: Enlarged prostate gland protrudes to the base of the urinary bladder Other: Calcified mesenteric mass measures 2.6 x 1.4 cm, stable. Musculoskeletal:  Spondylosis of the lumbosacral spine. IMPRESSION: 1. Postoperative changes from ileocecectomy with enterocolic anastomosis which is intact. 2. Calcified mesenteric mass measures 2.6 x 1.4 cm, stable. 3. Enlarged prostate gland protrudes to the base of the urinary bladder. Please correlate to PSA value. Electronically Signed   By: Ted Mcalpine M.D.   On: 02/02/2023 15:16      No orders of the defined types were placed in this encounter.  All questions were answered. The patient knows to call the clinic with any problems, questions or concerns. No barriers to learning was detected. The total time spent in the appointment was 21 minutes.     Malachy Mood, MD 02/03/2023   Carolin Coy am acting as scribe for Malachy Mood, MD.   I have reviewed the above documentation for accuracy and completeness, and I agree with the above.

## 2023-02-09 ENCOUNTER — Other Ambulatory Visit: Payer: Medicare Other

## 2023-02-15 ENCOUNTER — Inpatient Hospital Stay: Payer: Medicare Other | Admitting: Hematology

## 2023-02-21 ENCOUNTER — Other Ambulatory Visit (HOSPITAL_BASED_OUTPATIENT_CLINIC_OR_DEPARTMENT_OTHER): Payer: Self-pay

## 2023-02-21 DIAGNOSIS — K080 Exfoliation of teeth due to systemic causes: Secondary | ICD-10-CM | POA: Diagnosis not present

## 2023-02-25 ENCOUNTER — Other Ambulatory Visit (HOSPITAL_BASED_OUTPATIENT_CLINIC_OR_DEPARTMENT_OTHER): Payer: Self-pay

## 2023-03-14 ENCOUNTER — Other Ambulatory Visit: Payer: Self-pay

## 2023-03-16 ENCOUNTER — Other Ambulatory Visit (HOSPITAL_BASED_OUTPATIENT_CLINIC_OR_DEPARTMENT_OTHER): Payer: Self-pay

## 2023-04-08 ENCOUNTER — Other Ambulatory Visit (HOSPITAL_BASED_OUTPATIENT_CLINIC_OR_DEPARTMENT_OTHER): Payer: Self-pay

## 2023-04-10 ENCOUNTER — Other Ambulatory Visit (HOSPITAL_BASED_OUTPATIENT_CLINIC_OR_DEPARTMENT_OTHER): Payer: Self-pay

## 2023-04-10 MED ORDER — TRAZODONE HCL 50 MG PO TABS
ORAL_TABLET | ORAL | 1 refills | Status: DC
  Filled 2023-04-10: qty 90, 90d supply, fill #0

## 2023-04-11 DIAGNOSIS — K080 Exfoliation of teeth due to systemic causes: Secondary | ICD-10-CM | POA: Diagnosis not present

## 2023-04-18 ENCOUNTER — Other Ambulatory Visit (HOSPITAL_BASED_OUTPATIENT_CLINIC_OR_DEPARTMENT_OTHER): Payer: Self-pay

## 2023-05-03 ENCOUNTER — Other Ambulatory Visit (HOSPITAL_BASED_OUTPATIENT_CLINIC_OR_DEPARTMENT_OTHER): Payer: Self-pay

## 2023-05-03 ENCOUNTER — Other Ambulatory Visit: Payer: Self-pay | Admitting: Neurology

## 2023-05-03 ENCOUNTER — Other Ambulatory Visit: Payer: Self-pay | Admitting: Internal Medicine

## 2023-05-03 ENCOUNTER — Other Ambulatory Visit: Payer: Self-pay

## 2023-05-03 DIAGNOSIS — G20A1 Parkinson's disease without dyskinesia, without mention of fluctuations: Secondary | ICD-10-CM

## 2023-05-03 MED ORDER — CARBIDOPA-LEVODOPA 25-100 MG PO TABS
1.5000 | ORAL_TABLET | Freq: Three times a day (TID) | ORAL | 0 refills | Status: DC
Start: 2023-05-03 — End: 2023-06-27
  Filled 2023-05-03: qty 405, 90d supply, fill #0

## 2023-05-03 MED ORDER — CARBIDOPA-LEVODOPA ER 50-200 MG PO TBCR
1.0000 | EXTENDED_RELEASE_TABLET | Freq: Every day | ORAL | 0 refills | Status: DC
Start: 2023-05-03 — End: 2023-06-27
  Filled 2023-05-03: qty 90, 90d supply, fill #0

## 2023-05-03 MED ORDER — TAMSULOSIN HCL 0.4 MG PO CAPS
0.4000 mg | ORAL_CAPSULE | Freq: Every evening | ORAL | 2 refills | Status: DC
  Filled 2023-05-03: qty 30, 30d supply, fill #0
  Filled 2023-06-08: qty 30, 30d supply, fill #1
  Filled 2023-07-07 – 2023-07-08 (×2): qty 30, 30d supply, fill #2

## 2023-06-05 DIAGNOSIS — H35372 Puckering of macula, left eye: Secondary | ICD-10-CM | POA: Diagnosis not present

## 2023-06-08 ENCOUNTER — Other Ambulatory Visit (HOSPITAL_BASED_OUTPATIENT_CLINIC_OR_DEPARTMENT_OTHER): Payer: Self-pay

## 2023-06-09 ENCOUNTER — Other Ambulatory Visit (HOSPITAL_BASED_OUTPATIENT_CLINIC_OR_DEPARTMENT_OTHER): Payer: Self-pay

## 2023-06-21 NOTE — Progress Notes (Signed)
Assessment/Plan:   1.  Parkinsons Disease  -His genetic testing was negative.  -change  carbidopa/levodopa 25/100, 1.5 tabs at 7am/11am/4pm to rytary 195 mg, 1 capsule 4 times per day, 1 at 7am/11am/4pm/bedtime.  Hopefully, it could help the cognition and help the dyskinesia.    -d/c carbidopa/levodopa 50/200 at bed  -We discussed that it used to be thought that levodopa would increase risk of melanoma but now it is believed that Parkinsons itself likely increases risk of melanoma. he is to get regular skin checks.  He is following regularly with dermatology.  -refer to PT for gait/balance;refer to ST for cognition   2.  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  Discussed again neurocognitive testing, but ultimately they decided to hold on that.    3.  Depression  -On sertraline, 100 mg daily.  As above, may be making RBD worse and to discuss with prescribing physician  4.   RBD, RLS and insomnia  -sertraline could make RBD a bit worse.  We discussed this again today as RBD is worse.  He, however, may need the sertraline for mood  -used to follow with [facility] but doesn't now  -discussed again adding klonopin, 0.5 mg 1/2 po q hs.  He has fallen OOB since last visit and has more RLS.  They were agreeable.  Discussed r/b/se.  Discussed bed rains.  -On melatonin, 10 mg nightly.  5.  B12 deficiency  -On supplementation  6.  RLS  -Now on bedtime levodopa (changing to rytary)  7.  Confusion/MS change  -We stopped amantadine at the time, but it was clear it was not from the amantadine.  Ultimately, patient was diagnosed with EBV hepatitis, which self resolved.  Don't recommend amantadine now given confusion  -suspect a degree of mild to mod PDD currently.  Not driving now.  They don't want to do neurocog right now   8.  Low back pain/lumbar radiculopathy  -has gotten injections  in past from Dr. Alvester Morin but didn't find helpful.   Subjective:   Barry Pope was seen today in follow up.  My previous records were reviewed prior to todays visit.  I slightly increased the patient's levodopa last visit.  He did well with that.    No lightheadedness or near syncope.  Wife catches me in the hall and mentions poor memory, poor processing speed and hallucination.  Pt states that he has had 2 episodes of hallucinations - wife states that she may be in another room and will be talking to someone not there.  She thinks that it is pretty frequent.  C/o sleep.  PCP has on trazodone but not sure its helped.  C/o falling OOB - was sleeping - fell out of the bed and hit his head/nose.  This was a week or so ago.  Another fall in the spring and fell in the flower bed.  He is exercising at [employer] 4 days per week.  He has issues with RLS.     Current prescribed movement disorder medications: carbidopa/levodopa 25/100, 1.5 tablets 7am/11am/4pm (slight increase) Carbidopa/levodopa 50/200 CR at bedtime B12  PREVIOUS MEDICATIONS: Amantadine (stopped when he was in the hospital for confusion, but he had a lot of reasons to have confusion at that time, including hyponatremia, significantly elevated liver enzymes, fever and ultimately dx with ebv hepatitis)  ALLERGIES:  No Known Allergies  CURRENT MEDICATIONS:  Outpatient Encounter Medications as of 06/27/2023  Medication Sig   acyclovir (ZOVIRAX) 200 MG capsule 1 capusle Orally four times a day as needed   carbidopa-levodopa (SINEMET CR) 50-200 MG tablet Take 1 tablet by mouth at bedtime.   carbidopa-levodopa (SINEMET IR) 25-100 MG tablet Take 1.5 tablets by mouth 3 (three) times daily at 7am, 11am,  and 4pm.  Can take extra tablet at bedtime as needed   Cholecalciferol (VITAMIN D-3) 25 MCG (1000 UT) CAPS 1 capsule Orally Once a day for 30 day(s)   COVID-19 mRNA vaccine 2023-2024 (COMIRNATY) syringe Inject into the muscle.    Cyanocobalamin 1000 MCG TBCR 1 tablet Orally Once a day   ibuprofen (ADVIL) 200 MG tablet Take 400 mg by mouth every 6 (six) hours as needed for mild pain.   sertraline (ZOLOFT) 100 MG tablet Take 1 tablet (100 mg total) by mouth daily.   tamsulosin (FLOMAX) 0.4 MG CAPS capsule Take 1 capsule (0.4 mg total) by mouth every evening for urinary frequency.   traZODone (DESYREL) 50 MG tablet Take 50 mg by mouth at bedtime.   traZODone (DESYREL) 50 MG tablet Take 0.5-1 tablets (25-50 mg total) by mouth at bedtime as needed.   No facility-administered encounter medications on file as of 06/27/2023.    Objective:   PHYSICAL EXAMINATION:    VITALS:   Vitals:   06/27/23 1245  BP: 126/74  Pulse: 71  SpO2: 97%  Weight: 159 lb 12.8 oz (72.5 kg)    GEN:  The patient appears stated age and is in NAD. HEENT:  Normocephalic, atraumatic.  The mucous membranes are moist. The superficial temporal arteries are without ropiness or tenderness.   Neurological examination:  Orientation: The patient is alert and oriented x3. Cranial nerves: There is good facial symmetry with mild facial hypomimia. The speech is fluent and clear.  He is hypophonic.  Soft palate rises symmetrically and there is no tongue deviation. Hearing is decreased to conversational tone. Sensation: Sensation is intact to light touch throughout Motor: Strength is 5/5 in the UE/LE  Movement examination: Tone: There is  normal tone in the upper and lower extremities. Abnormal movements:  There is facial/neck dyskinesia Coordination:  There is no decremation today Gait and Station: The patient has no difficulty arising out of a deep-seated chair without the use of the hands. The patient is forward flexed with mild festination  I have reviewed and interpreted the following labs independently   Lab Results  Component Value Date   TSH 3.609 01/28/2022   Lab Results  Component Value Date   VITAMINB12 216 06/15/2021   Total time  spent on today's visit was 41 minutes, including both face-to-face time and nonface-to-face time.  Time included that spent on review of records (prior notes available to me/labs/imaging if pertinent), discussing treatment and goals, answering patient's questions and coordinating care.    Cc:  [name], MD

## 2023-06-27 ENCOUNTER — Other Ambulatory Visit (HOSPITAL_BASED_OUTPATIENT_CLINIC_OR_DEPARTMENT_OTHER): Payer: Self-pay

## 2023-06-27 ENCOUNTER — Other Ambulatory Visit: Payer: Self-pay

## 2023-06-27 ENCOUNTER — Encounter: Payer: Self-pay | Admitting: Neurology

## 2023-06-27 ENCOUNTER — Ambulatory Visit: Payer: Medicare Other | Admitting: Neurology

## 2023-06-27 VITALS — BP 126/74 | HR 71 | Wt 159.8 lb

## 2023-06-27 DIAGNOSIS — F02B0 Dementia in other diseases classified elsewhere, moderate, without behavioral disturbance, psychotic disturbance, mood disturbance, and anxiety: Secondary | ICD-10-CM

## 2023-06-27 DIAGNOSIS — G4752 REM sleep behavior disorder: Secondary | ICD-10-CM | POA: Diagnosis not present

## 2023-06-27 DIAGNOSIS — G20B2 Parkinson's disease with dyskinesia, with fluctuations: Secondary | ICD-10-CM | POA: Diagnosis not present

## 2023-06-27 DIAGNOSIS — G2581 Restless legs syndrome: Secondary | ICD-10-CM | POA: Diagnosis not present

## 2023-06-27 DIAGNOSIS — G20A1 Parkinson's disease without dyskinesia, without mention of fluctuations: Secondary | ICD-10-CM

## 2023-06-27 MED ORDER — CLONAZEPAM 0.5 MG PO TABS
0.2500 mg | ORAL_TABLET | Freq: Every day | ORAL | 1 refills | Status: DC
  Filled 2023-06-27: qty 45, 90d supply, fill #0
  Filled 2023-09-04 – 2023-09-18 (×2): qty 45, 90d supply, fill #1

## 2023-06-27 MED ORDER — RYTARY 48.75-195 MG PO CPCR: ORAL_CAPSULE | ORAL | Status: DC

## 2023-06-27 NOTE — Patient Instructions (Addendum)
STOP carbidopa/levodopa 25/100 (yellow pill) STOP carbidopa/levodopa 50/200 ER START rytary 195 mg, 1 at 7am/11am/4pm/bedtime  In 2 weeks, start clonazepam, 0.5 mg, HALF tablet at bed

## 2023-06-28 ENCOUNTER — Other Ambulatory Visit (HOSPITAL_COMMUNITY): Payer: Self-pay

## 2023-06-29 ENCOUNTER — Other Ambulatory Visit (HOSPITAL_BASED_OUTPATIENT_CLINIC_OR_DEPARTMENT_OTHER): Payer: Self-pay

## 2023-06-29 MED ORDER — COVID-19 MRNA VAC-TRIS(PFIZER) 30 MCG/0.3ML IM SUSY
0.3000 mL | PREFILLED_SYRINGE | Freq: Once | INTRAMUSCULAR | 0 refills | Status: AC
  Filled 2023-06-29: qty 0.3, 1d supply, fill #0

## 2023-07-03 ENCOUNTER — Ambulatory Visit: Payer: Medicare Other | Admitting: Physical Therapy

## 2023-07-06 ENCOUNTER — Other Ambulatory Visit (HOSPITAL_BASED_OUTPATIENT_CLINIC_OR_DEPARTMENT_OTHER): Payer: Self-pay

## 2023-07-08 ENCOUNTER — Other Ambulatory Visit (HOSPITAL_BASED_OUTPATIENT_CLINIC_OR_DEPARTMENT_OTHER): Payer: Self-pay

## 2023-07-12 ENCOUNTER — Other Ambulatory Visit (HOSPITAL_BASED_OUTPATIENT_CLINIC_OR_DEPARTMENT_OTHER): Payer: Self-pay

## 2023-07-13 ENCOUNTER — Other Ambulatory Visit: Payer: Self-pay

## 2023-07-13 ENCOUNTER — Other Ambulatory Visit (HOSPITAL_BASED_OUTPATIENT_CLINIC_OR_DEPARTMENT_OTHER): Payer: Self-pay

## 2023-07-13 ENCOUNTER — Telehealth: Payer: Self-pay | Admitting: Neurology

## 2023-07-13 MED ORDER — RYTARY 48.75-195 MG PO CPCR
1.0000 | ORAL_CAPSULE | Freq: Four times a day (QID) | ORAL | 0 refills | Status: DC
  Filled 2023-07-13: qty 360, 90d supply, fill #0

## 2023-07-13 NOTE — Telephone Encounter (Signed)
Patient's wife called to give a update on patient being on Carbidopa/Levodopa 48.75-195mg  . Patient is doing well, and would like a full RX of it to the [facility] at [facility]

## 2023-07-13 NOTE — Telephone Encounter (Signed)
Sent in RX for patient and called wife [name] patients wife said it has made a huge difference in patients facial dyskinesia and he is sleeping better with sleeping meds. Patients wife very grateful for the changes Dr. Arbutus Leas has made

## 2023-07-20 ENCOUNTER — Telehealth: Payer: Self-pay | Admitting: Physical Therapy

## 2023-07-20 NOTE — Telephone Encounter (Signed)
Created in error.  Barry Pope, PT 07/20/23 1:30 PM Phone: [phone] Fax: [phone]

## 2023-08-01 ENCOUNTER — Ambulatory Visit: Payer: Medicare Other | Admitting: Physical Therapy

## 2023-08-02 ENCOUNTER — Other Ambulatory Visit: Payer: Self-pay

## 2023-08-02 ENCOUNTER — Other Ambulatory Visit (HOSPITAL_BASED_OUTPATIENT_CLINIC_OR_DEPARTMENT_OTHER): Payer: Self-pay

## 2023-08-02 ENCOUNTER — Ambulatory Visit: Payer: Medicare Other | Attending: Neurology | Admitting: Physical Therapy

## 2023-08-02 ENCOUNTER — Encounter: Payer: Self-pay | Admitting: Physical Therapy

## 2023-08-02 DIAGNOSIS — R293 Abnormal posture: Secondary | ICD-10-CM | POA: Insufficient documentation

## 2023-08-02 DIAGNOSIS — R41841 Cognitive communication deficit: Secondary | ICD-10-CM | POA: Insufficient documentation

## 2023-08-02 DIAGNOSIS — R2689 Other abnormalities of gait and mobility: Secondary | ICD-10-CM | POA: Diagnosis not present

## 2023-08-02 DIAGNOSIS — R471 Dysarthria and anarthria: Secondary | ICD-10-CM | POA: Insufficient documentation

## 2023-08-02 DIAGNOSIS — R29818 Other symptoms and signs involving the nervous system: Secondary | ICD-10-CM | POA: Diagnosis not present

## 2023-08-02 DIAGNOSIS — R1312 Dysphagia, oropharyngeal phase: Secondary | ICD-10-CM | POA: Insufficient documentation

## 2023-08-02 DIAGNOSIS — G20B2 Parkinson's disease with dyskinesia, with fluctuations: Secondary | ICD-10-CM | POA: Insufficient documentation

## 2023-08-02 DIAGNOSIS — R2681 Unsteadiness on feet: Secondary | ICD-10-CM | POA: Insufficient documentation

## 2023-08-02 MED ORDER — INFLUENZA VAC A&B SURF ANT ADJ 0.5 ML IM SUSY
0.5000 mL | PREFILLED_SYRINGE | Freq: Once | INTRAMUSCULAR | 0 refills | Status: AC
  Filled 2023-08-02: qty 0.5, 1d supply, fill #0

## 2023-08-02 NOTE — Therapy (Signed)
OUTPATIENT PHYSICAL THERAPY NEURO EVALUATION   Patient Name: Barry Pope MRN: [id number] DOB:02-Feb-1946, 77 y.o., male Today's Date: 08/02/2023   PCP: [name], MD REFERRING PROVIDER: Vladimir Faster, DO  END OF SESSION:  PT End of Session - 08/02/23 1102     Visit Number 1    Number of Visits 17    Date for PT Re-Evaluation 09/29/23    Authorization Type BCBS Medicare    Progress Note Due on Visit 10    PT Start Time 1024    PT Stop Time 1102    PT Time Calculation (min) 38 min    Activity Tolerance Patient tolerated treatment well    Behavior During Therapy WFL for tasks assessed/performed             Past Medical History:  Diagnosis Date   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   Allergic rhinitis    Dysphonia 08/27/2019   Gastroesophageal reflux disease 03/26/2019   History of malignant carcinoid tumor of large intestine    History of malignant carcinoid tumor of small intestine    Insomnia    Knee osteoarthritis    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   Male erectile dysfunction, unspecified    Parkinson's disease 12/09/2019   Rosacea, unspecified    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   Vocal fold atrophy    Past Surgical History:  Procedure Laterality Date   APPENDECTOMY     COLONOSCOPY  2004/2009/2015   INGUINAL HERNIA REPAIR Right    KNEE SURGERY     PARTIAL COLECTOMY     Patient Active Problem List   Diagnosis Date Noted   Mesenteric mass 07/08/2022   History of malignant carcinoid tumor 07/08/2022   Muscular deconditioning 01/30/2022   Acute metabolic encephalopathy 01/28/2022   History of malignant carcinoid tumor of small intestine 01/27/2022   Insomnia 01/27/2022   Mild cognitive impairment 01/27/2022   Abnormal CXR 01/27/2022   Abnormal CT scan 01/27/2022   Hyponatremia 01/27/2022   Transaminitis 01/27/2022   Parkinson's disease 12/09/2019   Allergies    Dysphonia 08/27/2019   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  Wife is having to help with dressing, hard with bed mobility.  Feel like strength and stamina have dropped off.  Parkinson's symptoms have increased.  Shuffling is bad at times.  Wife also reports memory changes. Pt accompanied by: significant other  PERTINENT HISTORY: PD, memory change (suspect mild/mod PDD), depression,RBD, RLS, and insomnia, low back pain  PAIN:  Are you having pain? Yes: NPRS scale: 7-8/10 Pain location: back Pain description: muscle weakness Aggravating factors: bending over, standing too long Relieving factors: sitting  PRECAUTIONS: Fall  RED FLAGS: None   WEIGHT BEARING RESTRICTIONS: No  FALLS: Has patient fallen in last 6 months? Yes. Number of falls 3 3 falls last week (at the beach)  LIVING ENVIRONMENT: Lives with: lives with their spouse Lives in: House/apartment Stairs: Yes: Internal: 16 steps; on right going up and External: 2-3 steps; on right going up Has following equipment at home: Single point cane  PLOF: Independent, Leisure: works out at [employer] several days/wk, and needing more assistance from wife for bed mobility and for dressing  PATIENT GOALS: Patient:  to help with balance and walking.  Wife:   to help with more movement in shoulders and help with better mobility.  OBJECTIVE:  Note: Objective measures were completed at Evaluation unless otherwise noted.  DIAGNOSTIC FINDINGS: NA  COGNITION: Overall cognitive status: History of cognitive impairments - at baseline   COORDINATION: Slowed alternating taps  MUSCLE TONE: LLE: Mild  POSTURE: rounded shoulders, forward head, and posterior pelvic tilt  UE ROM:  approx 100 degrees shoulder flex bilaterally, elbows are flexed   WFL abduction bilaterally, elbows flexed   WFL internal and external rotation,slowed  LOWER EXTREMITY ROM:     Active  Right Eval Left Eval  Hip flexion    Hip extension    Hip abduction    Hip adduction    Hip internal rotation    Hip external rotation    Knee flexion    Knee extension -12 -10  Ankle dorsiflexion    Ankle plantarflexion    Ankle inversion    Ankle eversion     (Blank rows = not tested)  LOWER EXTREMITY MMT:    MMT Right Eval Left Eval  Hip flexion 5 4+  Hip extension    Hip abduction 5 5  Hip adduction 4 4  Hip internal rotation    Hip external rotation    Knee flexion 5 4+  Knee extension 5 5  Ankle dorsiflexion 4+ 4  Ankle plantarflexion    Ankle inversion    Ankle eversion    (Blank rows = not tested)  BED MOBILITY:  Reports difficulty and needs wife's assistance   TRANSFERS: Assistive device utilized: None  Sit to stand: SBA Stand to sit: SBA *Reports difficulty with sit to stand from low surfaces  GAIT: Gait pattern: step through pattern, decreased arm swing- Right, decreased arm swing- Left, decreased stride length, shuffling, trunk flexed, and narrow BOS Distance walked: 50 ft x 2 Assistive device utilized: None Level of assistance: SBA Comments: Wife reports shuffling gait at home.  FUNCTIONAL TESTS:  5 times sit to stand: 10.38 sec arms crossed at chest Timed up and go (TUG): 14.13 10 meter walk test: 9.19 sec  (3.57 ft/sec) 360 turn R:   30.78 sec, festination>30 steps To L:  17 steps, 7.59 sec 57M back:  16.82 sec (0.6 ft/sec) TUG cognitive:  18.38 sec TODAY'S TREATMENT:                                                                                                                              DATE: 08/02/2023    PATIENT EDUCATION: Education details: Eval results, POC Person educated: Patient and Spouse Education method: Explanation Education comprehension: verbalized understanding  HOME EXERCISE PROGRAM: Not yet initiated  GOALS: Goals reviewed with patient? Yes  SHORT TERM GOALS: Target date: 09/01/2023  Pt will be independent with HEP for improved balance, gait, posture. Baseline: Goal status: INITIAL  2.  Pt will improve TUG/TUG cognitive score to less than or equal to 10% difference for decreased fall risk. Baseline: 14.13/18.38 Goal status: INITIAL  3.  Pt/wife will report at least 50% improvement in bed mobility and transfers, for decreased caregiver burden. Baseline:  Goal status: INITIAL   LONG TERM GOALS: Target date:  09/29/2023   Pt will be independent with HEP for improved balance, gait, posture, functional mobility. Baseline:  Goal status: INITIAL  2.  Pt will verbalize understanding of local Parkinson's disease community resources, including optimal fitness upon d/c from PT. Baseline:  Goal status: INITIAL  3.  Pt will perform 360 degree turns to R and L in <10-12 steps, for improved balance. Baseline:  Goal status: INITIAL  4.  MiniBESTest score to improve by at least 5 points for decreased fall risk. Baseline: TBA Goal status: INITIAL  5.  Backwards gait velocity in 67M walk to improve to 1 ft/sec for improved balance. Baseline: 0.6 ft/sec Goal status: INITIAL  ASSESSMENT:  CLINICAL IMPRESSION: Patient is a 77 y.o. male who was seen today for physical therapy evaluation and treatment for Parkinson's disease.   This patient is known to this clinic from bout of therapy in  spring 2023.  Since that time, pt/wife report despite his typical exercise routine, he has had more rapid decline in functional mobility (needing assistance from wife with bed mobility and dressing tasks) as well as increased Parkinson's symptoms.  He has had several falls, including 3 falls last week (at the beach) and wife reports memory changes.  Pt presents today with good functional strength, but bradykinesia/increased stiffness in LLE, abnormal posture, slightly decreased LLE strength compared to RLE, decreased balance, decreased timing and coordination of gait and turns.  He has shuffling steps with backwards walking and with turns.  He is at increased fall risk per 360 turn and 67M backwards walk test and at increased fall risk/demo decreased dual tasking with TUG/TUG cognitive scores.  He enjoys working out at [employer], with classes and with [employer].  He will benefit from skilled PT to address the above stated deficits to decrease fall risk and improve overall functional mobility.  *Of note, based on reports of how much wife is assisting with dressing and bed mobility, pt would likely benefit from OT eval; however, did not have a chance to discuss with patient today due to time  constraints.  OBJECTIVE IMPAIRMENTS: Abnormal gait, decreased balance, decreased knowledge of use of DME, decreased mobility, difficulty walking, decreased ROM, decreased strength, impaired flexibility, impaired tone, and postural dysfunction.   ACTIVITY LIMITATIONS: transfers, bed mobility, dressing, reach over head, hygiene/grooming, and locomotion level  PARTICIPATION LIMITATIONS: community activity, yard work, and fitness, travel  PERSONAL FACTORS: 3+ comorbidities: see above  are also affecting patient's functional outcome.   REHAB POTENTIAL: Good  CLINICAL DECISION MAKING: Evolving/moderate complexity  EVALUATION COMPLEXITY: Moderate  PLAN:  PT FREQUENCY: 2x/week  PT DURATION: 8 weeks plus eval  PLANNED  INTERVENTIONS: 97110-Therapeutic exercises, 97530- Therapeutic activity, [id number]- Neuromuscular re-education, 97535- Self Care, 47829- Manual therapy, [phone]- Gait training, Patient/Family education, Balance training, Stair training, DME instructions, and Moist heat  PLAN FOR NEXT SESSION: Initiate HEP-likely some PWR! Moves-standing, supine (to help with bed mobility); need to assess MiniBESTest and bed mobility and begin addressing with large amplitude movements; will need to include wife in education   Gean Maidens., PT 08/02/2023, 1:26 PM  [facility] at [facility] [address], [address] [place], [place] [address] Phone # [phone] Fax # [phone]

## 2023-08-07 ENCOUNTER — Ambulatory Visit: Payer: Medicare Other

## 2023-08-07 ENCOUNTER — Other Ambulatory Visit: Payer: Self-pay

## 2023-08-07 DIAGNOSIS — R2681 Unsteadiness on feet: Secondary | ICD-10-CM

## 2023-08-07 DIAGNOSIS — R1312 Dysphagia, oropharyngeal phase: Secondary | ICD-10-CM | POA: Diagnosis not present

## 2023-08-07 DIAGNOSIS — R293 Abnormal posture: Secondary | ICD-10-CM | POA: Diagnosis not present

## 2023-08-07 DIAGNOSIS — R471 Dysarthria and anarthria: Secondary | ICD-10-CM | POA: Diagnosis not present

## 2023-08-07 DIAGNOSIS — R2689 Other abnormalities of gait and mobility: Secondary | ICD-10-CM | POA: Diagnosis not present

## 2023-08-07 DIAGNOSIS — G20B2 Parkinson's disease with dyskinesia, with fluctuations: Secondary | ICD-10-CM | POA: Diagnosis not present

## 2023-08-07 DIAGNOSIS — R41841 Cognitive communication deficit: Secondary | ICD-10-CM | POA: Diagnosis not present

## 2023-08-07 DIAGNOSIS — R29818 Other symptoms and signs involving the nervous system: Secondary | ICD-10-CM

## 2023-08-07 NOTE — Therapy (Signed)
OUTPATIENT PHYSICAL THERAPY NEURO TREATMENT   Patient Name: SHAQUELLE PARATORE MRN: [id number] DOB:[birth year], 77 y.o., male Today's Date: 08/07/2023   PCP: [name], MD REFERRING PROVIDER: Vladimir Faster, DO  END OF SESSION:  PT End of Session - 08/07/23 0933     Visit Number 2    Number of Visits 17    Date for PT Re-Evaluation 09/29/23    Authorization Type BCBS Medicare    Progress Note Due on Visit 10    PT Start Time 0932    PT Stop Time 1015    PT Time Calculation (min) 43 min    Activity Tolerance Patient tolerated treatment well    Behavior During Therapy [facility] for tasks assessed/performed             Past Medical History:  Diagnosis Date   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   Allergic rhinitis    Dysphonia 08/27/2019   Gastroesophageal reflux disease 03/26/2019   History of malignant carcinoid tumor of large intestine    History of malignant carcinoid tumor of small intestine    Insomnia    Knee osteoarthritis    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   Male erectile dysfunction, unspecified    Parkinson's disease 12/09/2019   Rosacea, unspecified    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   Vocal fold atrophy    Past Surgical History:  Procedure Laterality Date   APPENDECTOMY     COLONOSCOPY  2004/2009/2015   INGUINAL HERNIA REPAIR Right    KNEE SURGERY     PARTIAL COLECTOMY     Patient Active Problem List   Diagnosis Date Noted   Mesenteric mass 07/08/2022   History of malignant carcinoid tumor 07/08/2022   Muscular deconditioning 01/30/2022   Acute metabolic encephalopathy 01/28/2022   History of malignant carcinoid tumor of small intestine 01/27/2022   Insomnia 01/27/2022   Mild cognitive impairment 01/27/2022   Abnormal CXR 01/27/2022   Abnormal CT scan 01/27/2022   Hyponatremia 01/27/2022   Transaminitis 01/27/2022   Parkinson's disease 12/09/2019   Allergies    Dysphonia 08/27/2019   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  03/26/2019   Gastroesophageal reflux disease 03/26/2019   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    ONSET DATE: 06/27/2023 (MD referral)  REFERRING DIAG: G20.B2 (ICD-10-CM) - Parkinson's disease with dyskinesia and fluctuating manifestations (HCC)   THERAPY DIAG:  Other abnormalities of gait and mobility  Abnormal posture  Unsteadiness on feet  Other symptoms and signs involving the nervous system  Rationale for Evaluation and Treatment: Rehabilitation  SUBJECTIVE:  SUBJECTIVE STATEMENT: Doing ok, low back is still bothering me when I'm doing chores around the house Pt accompanied by: significant other  PERTINENT HISTORY: PD, memory change (suspect mild/mod PDD), depression,RBD, RLS, and insomnia, low back pain  PAIN:  Are you having pain? Yes: NPRS scale: 0/10 Pain location: back Pain description: muscle weakness Aggravating factors: bending over, standing too long Relieving factors: sitting  PRECAUTIONS: Fall  RED FLAGS: None   WEIGHT BEARING RESTRICTIONS: No  FALLS: Has patient fallen in last 6 months? Yes. Number of falls 3 3 falls last week (at the beach)  LIVING ENVIRONMENT: Lives with: lives with their spouse Lives in: House/apartment Stairs: Yes: Internal: 16 steps; on right going up and External: 2-3 steps; on right going up Has following equipment at home: Single point cane  PLOF: Independent, Leisure: works out at [employer] several days/wk, and needing more assistance from wife for bed mobility and for dressing  PATIENT GOALS: Patient:  to help with balance and walking.  Wife:  to help with more movement in shoulders and help with better mobility.  OBJECTIVE:   TODAY'S TREATMENT: 08/07/23 Activity Comments  Mini-BESTest 21/28  Bed mobility (on mat table) -sit to supine  indep -rolling left modified indep -rolling right CGA -supine to sit modified indep  Mat table activities for improved bed mobility and flexibility -LTR 10x -sidelying open book 1x10 -prone on elbows, alt overhead reach 1x10              PATIENT EDUCATION: Education details: MINI-BESTest results, HEP initiation Person educated: Patient Education method: Explanation, demo/practice, handouts Education comprehension: verbalized understanding  HOME EXERCISE PROGRAM: Access Code: [email or web address] URL: [email or web address] Date: 08/07/2023 Prepared by: Shary Decamp  Exercises - Supine Lower Trunk Rotation  - 1 x daily - 7 x weekly - 1-3 sets - 10 reps - Sidelying Open Book Thoracic Lumbar Rotation and Extension  - 1 x daily - 7 x weekly - 3 sets - 10 reps - Prone on Elbows Reach  - 1 x daily - 7 x weekly - 1-3 sets - 10 reps  Note: Objective measures were completed at Evaluation unless otherwise noted.  DIAGNOSTIC FINDINGS: NA  COGNITION: Overall cognitive status: History of cognitive impairments - at baseline   COORDINATION: Slowed alternating taps  MUSCLE TONE: LLE: Mild  POSTURE: rounded shoulders, forward head, and posterior pelvic tilt  UE ROM:  approx 100 degrees shoulder flex bilaterally, elbows are flexed   WFL abduction bilaterally, elbows flexed   WFL internal and external rotation,slowed  LOWER EXTREMITY ROM:     Active  Right Eval Left Eval  Hip flexion    Hip extension    Hip abduction    Hip adduction    Hip internal rotation    Hip external rotation    Knee flexion    Knee extension -12 -10  Ankle dorsiflexion    Ankle plantarflexion    Ankle inversion    Ankle eversion     (Blank rows = not tested)  LOWER EXTREMITY MMT:    MMT Right Eval Left Eval  Hip flexion 5 4+  Hip extension    Hip abduction 5 5  Hip adduction 4 4  Hip internal rotation    Hip external rotation    Knee flexion 5 4+  Knee extension 5 5  Ankle  dorsiflexion 4+ 4  Ankle plantarflexion    Ankle inversion    Ankle eversion    (Blank rows = not tested)  BED MOBILITY:  Reports difficulty and needs wife's assistance   TRANSFERS: Assistive device utilized: None  Sit to stand: SBA Stand to sit: SBA *Reports difficulty with sit to stand from low surfaces  GAIT: Gait pattern: step through pattern, decreased arm swing- Right, decreased arm swing- Left, decreased stride length, shuffling, trunk flexed, and narrow BOS Distance walked: 50 ft x 2 Assistive device utilized: None Level of assistance: SBA Comments: Wife reports shuffling gait at home.  FUNCTIONAL TESTS:  5 times sit to stand: 10.38 sec arms crossed at chest Timed up and go (TUG): 14.13 10 meter walk test: 9.19 sec  (3.57 ft/sec) 360 turn R:  30.78 sec, festination>30 steps To L:  17 steps, 7.59 sec 72M back:  16.82 sec (0.6 ft/sec) TUG cognitive:  18.38 sec    GOALS: Goals reviewed with patient? Yes  SHORT TERM GOALS: Target date: 09/01/2023  Pt will be independent with HEP for improved balance, gait, posture. Baseline: Goal status: INITIAL  2.  Pt will improve TUG/TUG cognitive score to less than or equal to 10% difference for decreased fall risk. Baseline: 14.13/18.38 Goal status: INITIAL  3.  Pt/wife will report at least 50% improvement in bed mobility and transfers, for decreased caregiver burden. Baseline:  Goal status: INITIAL   LONG TERM GOALS: Target date:  09/29/2023   Pt will be independent with HEP for improved balance, gait, posture, functional mobility. Baseline:  Goal status: INITIAL  2.  Pt will verbalize understanding of local Parkinson's disease community resources, including optimal fitness upon d/c from PT. Baseline:  Goal status: INITIAL  3.  Pt will perform 360 degree turns to R and L in <10-12 steps, for improved balance. Baseline:  Goal status: INITIAL  4.  MiniBESTest score to improve by at least 5 points for decreased  fall risk. Baseline: 21/28 Goal status: IN PROGRESS  5.  Backwards gait velocity in 72M walk to improve to 1 ft/sec for improved balance. Baseline: 0.6 ft/sec Goal status: INITIAL  ASSESSMENT:  CLINICAL IMPRESSION: Session initiated with Mini-BESTest assessment and score of 21/28 indicating increased risk for falls with greatest deficits being single limb support, activities requiring turns, and multi-sensory balance demands.  Pt notes difficulty with bed mobility such that positioning and mobilizing in/out of bed is difficult.  Increased difficulty with rolling right vs left. Provided activities to improve flexibility, mobility and coordination to improve bed mobility. Continued sessions to progress POC details to improve mobility and reduce risk for falls.   OBJECTIVE IMPAIRMENTS: Abnormal gait, decreased balance, decreased knowledge of use of DME, decreased mobility, difficulty walking, decreased ROM, decreased strength, impaired flexibility, impaired tone, and postural dysfunction.   ACTIVITY LIMITATIONS: transfers, bed mobility, dressing, reach over head, hygiene/grooming, and locomotion level  PARTICIPATION LIMITATIONS: community activity, yard work, and fitness, travel  PERSONAL FACTORS: 3+ comorbidities: see above  are also affecting patient's functional outcome.   REHAB POTENTIAL: Good  CLINICAL DECISION MAKING: Evolving/moderate complexity  EVALUATION COMPLEXITY: Moderate  PLAN:  PT FREQUENCY: 2x/week  PT DURATION: 8 weeks plus eval  PLANNED INTERVENTIONS: 97110-Therapeutic exercises, 97530- Therapeutic activity, [id number]- Neuromuscular re-education, 97535- Self Care, [address]- Manual therapy, [phone]- Gait training, Patient/Family education, Balance training, Stair training, DME instructions, and Moist heat  PLAN FOR NEXT SESSION: Initiate HEP-likely some PWR! Moves-standing, supine (to help with bed mobility);  will need to include wife in education   Dion Body,  PT 08/07/2023, 9:34 AM  [facility] at [facility] [address] [place], [address] [place], [place]  [address] Phone # [phone] Fax # [phone]

## 2023-08-07 NOTE — Therapy (Signed)
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   Allergic rhinitis    Dysphonia 08/27/2019   Gastroesophageal reflux disease 03/26/2019   History of malignant carcinoid tumor of large intestine    History of malignant carcinoid tumor of small intestine    Insomnia    Knee osteoarthritis    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   Male erectile dysfunction, unspecified    Parkinson's disease 12/09/2019   Rosacea, unspecified    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   Vocal fold atrophy    Past Surgical History:  Procedure Laterality Date   APPENDECTOMY     COLONOSCOPY  2004/2009/2015   INGUINAL HERNIA REPAIR Right    KNEE SURGERY     PARTIAL COLECTOMY     Patient Active Problem List   Diagnosis Date Noted   Mesenteric mass 07/08/2022   History of malignant carcinoid tumor 07/08/2022   Muscular deconditioning 01/30/2022   Acute metabolic encephalopathy 01/28/2022   History of malignant carcinoid tumor of small intestine 01/27/2022   Insomnia 01/27/2022   Mild cognitive impairment 01/27/2022   Abnormal CXR 01/27/2022   Abnormal CT scan 01/27/2022   Hyponatremia 01/27/2022   Transaminitis 01/27/2022   Parkinson's disease 12/09/2019   Allergies    Dysphonia 08/27/2019   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  Pt accompanied by: self  PERTINENT HISTORY: PD, memory change (suspect mild/mod PDD), depression,RBD, RLS, and insomnia, low back pain  Had ST in Summer 2020 (voice based ST), and in Spring 2021 (SpeakOut!)  PAIN:  Are you having pain? No  FALLS: Has patient fallen in last 6 months? Yes. Number of falls 3, the week of vacation (2 weeks ago)  LIVING ENVIRONMENT: Lives with: lives with their spouse Lives in: House/apartment  PLOF:  Level of assistance: Comment: Needs assist from wife with dressing and bed mobility Employment: Retired  PATIENT GOALS: "I want to make my talking better."  OBJECTIVE:  Note: Objective measures were completed at Evaluation unless otherwise noted.  DIAGNOSTIC FINDINGS: Genetic tests for PD came back negative  COGNITION: Overall cognitive status: History of cognitive impairments - at baseline Areas of impairment: Memory and Following commands Comments: Wife reported during last visit with Dr. Arbutus Leas that pt having more difficulty with memory, processing speed.  MOTOR SPEECH: Overall motor speech: impaired Level of impairment: Word, Phrase, Sentence, and Conversation Respiration: thoracic breathing Phonation: low vocal intensity Resonance: WFL Articulation: Impaired: word, phrase, sentence, and conversation Intelligibility: Intelligible however when door open to ST room pt intelligibility suffered Motor planning: Appears intact Motor speech errors: unaware and consistent Interfering  components: premorbid status Effective technique: increased vocal intensity  ORAL MOTOR EXAMINATION: Overall status: Impaired: Labial: Bilateral (ROM and Coordination) Lingual: Right (ROM, Strength, and Coordination), and Left, ROM and coordination Comments: mild hypomemia noted  OBJECTIVE VOICE ASSESSMENT: Sustained "ah" maximum phonation time: 12.54 seconds Sustained "ah" loudness average: 80 dB with model and usual min A for loudness and eliminating loudness decay Oral reading (passage) loudness average: 67 dB Oral reading loudness range: 62-71 dB Conversational loudness average: 63 dB Conversational loudness range: 57-66 dB Voice quality: breathy, harsh, low vocal intensity, and aphonic Stimulability trials: Given SLP modeling and occasional mod cues, loudness average increased to 72dB (range of 64 to 76) at reading 2 -3 sentence level. Pt averaged 70dB with completing sentences given SLP rare min A.  Comments: Cognitive load impacted pt's ability to respond with more intentional speech.  Completed audio recording of patients baseline voice without cueing from SLP: No  Pt does report difficulty with swallowing with liquids primarily, once a day average -this does warrant further evaluation. SLP recommends MBS.  PATIENT REPORTED OUTCOME MEASURES (PROM): Communication Effectiveness Survey: 11/32, with lower scores indicating greater impact of deficit on communicative effectiveness  TODAY'S TREATMENT:                                                                                                                                         DATE:  08/07/23: SLP shaped pt's loud /a/ using modeling and usual mod verbal cues fading to occasional min verbal cues, and SLP completed diagnostic therapy to ascertain pt was able to improve loudness and effectiveness of his utterances when focusing on intent and being deliberate with his speech.  PATIENT EDUCATION: Education details: Eval results,  need for ST at this time, see "today's treatment" Person educated: Patient and Spouse Education method: Explanation Education comprehension: verbalized understanding and needs further education  HOME EXERCISE PROGRAM: TBD   GOALS: Goals reviewed with patient? Yes, in general  SHORT TERM GOALS: Target date: 10/23/23 (pt start date for ST 09/25/23)  Pt will demo volume of average 69dB in sentence responses with focus on increased intent and purpose in 3 sessions Baseline: Goal status: INITIAL  2.  Pt will improve speech volume in 2 minutes simple conversation to 69dB with rare min A in 2 sessions  Baseline:  Goal status: INITIAL  3.  Pt will complete at least 6 lessons of SpeakOut!/week for 2 weeks, as noted on practice tracker sheet Baseline:  Goal status: INITIAL  4.  Pt should undergo MBS, and then follow precautions from MBS (if ordered) with modified  independence in 2 sessions Baseline:  Goal status: INITIAL  5.  Pt will undergo objective cognitive communication assessment in first 4 sessions (goals added PRN) Baseline:  Goal status: INITIAL   LONG TERM GOALS: Target date: 11/25/23  Pt will improve speech volume in 5 minutes simple conversation to 69dB with rare min A in 3 sessions  Baseline:  Goal status: INITIAL  2.  Pt will have completed SpeakOut warm up tasks (M-words, ah, glides, numbers) at least 6 days/week for 2 weeks after 10/23/23, as noted on practice tracker sheet Baseline:  Goal status: INITIAL  3.  Pt will follow precautions from MBS (if ordered) with modified independence in 3 sessions, after 10/23/23 Baseline:  Goal status: INITIAL  4.  Pt will demo in  3 sessions or (between 3 essions) report successful usage of memory compensations  Baseline:  Goal status: INITIAL   ASSESSMENT:  CLINICAL IMPRESSION: Patient is a 77 y.o. M who was seen today for assessment of cognition in light of pt's PD. Pt's current PD med regimen was recommended to change   carbidopa/levodopa 25/100, 1.5 tabs at 7am/11am/4pm to rytary 195 mg, 1 capsule 4 times per day, 1 at 7am/11am/4pm/bedtime, to better assist decr dyskinesia and improve cognition. Today, pt improved loudness/clarity with focus on using intentional speech, but not to WNL loudness levels. Prognosis is good for improvement of speech intelligibility given this performance today. A MBS is also suggested, and this will be sent to referring MD today. Lastly, pt will require formal cognitive communcation assessment and will be completed in first 4 sessions. Pt desires to postpone start date of ST until after completing PT plan of care in December.  OBJECTIVE IMPAIRMENTS: Objective impairments include memory, executive functioning, receptive language, dysarthria, and dysphagia. These impairments are limiting patient from household responsibilities, ADLs/IADLs, and effectively communicating at home and in community.Factors affecting potential to achieve goals and functional outcome are co-morbidities and medical prognosis.. Patient will benefit from skilled SLP services to address above impairments and improve overall function.  REHAB POTENTIAL: Good  PLAN:  SLP FREQUENCY: 1-2x/week  SLP DURATION: 8 weeks  PLANNED INTERVENTIONS: 92526 Treatment of swallowing function, 92507 Treatment of speech (30 or 45 min) , Aspiration precaution training, Pharyngeal strengthening exercises, Diet toleration management , Environmental controls, Trials of upgraded texture/liquids, Cognitive reorganization, Internal/external aids, Functional tasks, SLP instruction and feedback, Compensatory strategies, and Patient/family education    [facility], CCC-SLP 08/07/2023, 1:06 PM

## 2023-08-07 NOTE — Patient Instructions (Signed)
   Today we looked at [facility] speech - he was consistently using softer than normal speech volume. I think he could benefit from some speech therapy (ST). He already has some ST appointments starting next week, for two weeks. I would want to see him twice a week for 8 weeks. He could schedule ST right before or right after his PT appointments, or could wait until his treatment course has been completed with PT, then begin ST his appointments. Please talk this over and call Cordelia Pen to let us know what you prefer to do with ST. Thank you so much! When we do speech therapy, I will also assess memory and thinking skills and treat those if there is an interest in doing that.  He also shared he has some difficulty with swallowing. I will recommend a modified barium swallow evaluation to Dr. Arbutus Leas, and if she agrees, she will order this evaluation and someone from the rehabilitation department at the hospital will call you for it to be scheduled and completed at [facility] or [facility].

## 2023-08-08 ENCOUNTER — Other Ambulatory Visit (HOSPITAL_COMMUNITY): Payer: Self-pay | Admitting: *Deleted

## 2023-08-08 ENCOUNTER — Other Ambulatory Visit (HOSPITAL_BASED_OUTPATIENT_CLINIC_OR_DEPARTMENT_OTHER): Payer: Self-pay

## 2023-08-08 DIAGNOSIS — R1310 Dysphagia, unspecified: Secondary | ICD-10-CM

## 2023-08-08 MED ORDER — TAMSULOSIN HCL 0.4 MG PO CAPS
0.4000 mg | ORAL_CAPSULE | Freq: Every evening | ORAL | 5 refills | Status: DC
  Filled 2023-08-08: qty 30, 30d supply, fill #0
  Filled 2023-09-04: qty 30, 30d supply, fill #1
  Filled 2023-10-11: qty 30, 30d supply, fill #2
  Filled 2023-11-03: qty 30, 30d supply, fill #3
  Filled 2023-12-06: qty 30, 30d supply, fill #4
  Filled 2024-01-08: qty 30, 30d supply, fill #5

## 2023-08-08 NOTE — Therapy (Signed)
OUTPATIENT PHYSICAL THERAPY NEURO TREATMENT   Patient Name: Barry Pope MRN: [id number] DOB:January 24, 1946, 77 y.o., male Today's Date: 08/09/2023   PCP: [name], MD REFERRING PROVIDER: Vladimir Faster, DO  END OF SESSION:  PT End of Session - 08/09/23 1058     Visit Number 3    Number of Visits 17    Date for PT Re-Evaluation 09/29/23    Authorization Type BCBS Medicare    Progress Note Due on Visit 10    PT Start Time 1017    PT Stop Time 1059    PT Time Calculation (min) 42 min    Equipment Utilized During Treatment Gait belt    Activity Tolerance Patient tolerated treatment well    Behavior During Therapy WFL for tasks assessed/performed              Past Medical History:  Diagnosis Date   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   Allergic rhinitis    Dysphonia 08/27/2019   Gastroesophageal reflux disease 03/26/2019   History of malignant carcinoid tumor of large intestine    History of malignant carcinoid tumor of small intestine    Insomnia    Knee osteoarthritis    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   Male erectile dysfunction, unspecified    Parkinson's disease 12/09/2019   Rosacea, unspecified    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   Vocal fold atrophy    Past Surgical History:  Procedure Laterality Date   APPENDECTOMY     COLONOSCOPY  2004/2009/2015   INGUINAL HERNIA REPAIR Right    KNEE SURGERY     PARTIAL COLECTOMY     Patient Active Problem List   Diagnosis Date Noted   Mesenteric mass 07/08/2022   History of malignant carcinoid tumor 07/08/2022   Muscular deconditioning 01/30/2022   Acute metabolic encephalopathy 01/28/2022   History of malignant carcinoid tumor of small intestine 01/27/2022   Insomnia 01/27/2022   Mild cognitive impairment 01/27/2022   Abnormal CXR 01/27/2022   Abnormal CT scan 01/27/2022   Hyponatremia 01/27/2022   Transaminitis 01/27/2022   Parkinson's disease 12/09/2019   Allergies    Dysphonia 08/27/2019    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 LB is better.   Pt accompanied by: self  PERTINENT HISTORY: PD, memory change (suspect mild/mod PDD), depression,RBD, RLS, and insomnia, low back pain  PAIN:  Are you having pain? Yes: NPRS scale: 0/10 Pain location: back Pain description: muscle weakness Aggravating factors: bending over, standing too long Relieving factors: sitting  PRECAUTIONS: Fall  RED FLAGS: None   WEIGHT BEARING RESTRICTIONS: No  FALLS: Has patient fallen in last 6 months? Yes. Number of falls 3 3 falls last week (at the beach)  LIVING ENVIRONMENT: Lives with: lives with their spouse Lives in: House/apartment Stairs: Yes: Internal: 16 steps; on right going up and External: 2-3 steps; on right going up Has following equipment at home: Single point cane  PLOF: Independent, Leisure: works out at [employer] several days/wk, and needing more assistance from wife for bed mobility and for dressing  PATIENT GOALS: Patient:  to help with balance and walking.  Wife:  to help with more movement in shoulders and help with better mobility.  OBJECTIVE:     TODAY'S TREATMENT: 08/09/23 Activity Comments  review of HEP: LTR 10x open book  stretch 10x each  prone on elbows reach 10x  Cues to prevent LB from lifting off mat with LTR. Avoiding knees lifting up with open book. Increased amplitude with reaching and increased wt shift    standing PWR moves in II bars: Up 10x Rock 10x Twist 2x10x step 10x Cueing for larger palms, eye boost, pivoting on toes with PWR twist   standing PWR FLOW in II bars: 5x Demo and verbal cues throughout; pt notes difficulty remembering movements   1/2 turns in II bars , then with added ball toss Focus on large efficiency steps ; narrow BOS trunk flexed, often not achieving full turn               PATIENT EDUCATION: Education details: HEP update with standing PWR moves flow to be performed near counter and provided video link Person educated: Patient Education method: Explanation, Demonstration, Tactile cues, Verbal cues, and Handouts Education comprehension: verbalized understanding and returned demonstration   HOME EXERCISE PROGRAM: Access Code: [email or web address] URL: [email or web address] Date: 08/07/2023 Prepared by: Shary Decamp  Exercises - Supine Lower Trunk Rotation  - 1 x daily - 7 x weekly - 1-3 sets - 10 reps - Sidelying Open Book Thoracic Lumbar Rotation and Extension  - 1 x daily - 7 x weekly - 3 sets - 10 reps - Prone on Elbows Reach  - 1 x daily - 7 x weekly - 1-3 sets - 10 reps  + standing PWR FLOW video 2x/day near counter top     Note: Objective measures were completed at Evaluation unless otherwise noted.  DIAGNOSTIC FINDINGS: NA  COGNITION: Overall cognitive status: History of cognitive impairments - at baseline   COORDINATION: Slowed alternating taps  MUSCLE TONE: LLE: Mild  POSTURE: rounded shoulders, forward head, and posterior pelvic tilt  UE ROM:  approx 100 degrees shoulder flex bilaterally, elbows are flexed   WFL abduction bilaterally, elbows flexed   WFL internal and external rotation,slowed  LOWER EXTREMITY ROM:     Active   Right Eval Left Eval  Hip flexion    Hip extension    Hip abduction    Hip adduction    Hip internal rotation    Hip external rotation    Knee flexion    Knee extension -12 -10  Ankle dorsiflexion    Ankle plantarflexion  Ankle inversion    Ankle eversion     (Blank rows = not tested)  LOWER EXTREMITY MMT:    MMT Right Eval Left Eval  Hip flexion 5 4+  Hip extension    Hip abduction 5 5  Hip adduction 4 4  Hip internal rotation    Hip external rotation    Knee flexion 5 4+  Knee extension 5 5  Ankle dorsiflexion 4+ 4  Ankle plantarflexion    Ankle inversion    Ankle eversion    (Blank rows = not tested)  BED MOBILITY:  Reports difficulty and needs wife's assistance   TRANSFERS: Assistive device utilized: None  Sit to stand: SBA Stand to sit: SBA *Reports difficulty with sit to stand from low surfaces  GAIT: Gait pattern: step through pattern, decreased arm swing- Right, decreased arm swing- Left, decreased stride length, shuffling, trunk flexed, and narrow BOS Distance walked: 50 ft x 2 Assistive device utilized: None Level of assistance: SBA Comments: Wife reports shuffling gait at home.  FUNCTIONAL TESTS:  5 times sit to stand: 10.38 sec arms crossed at chest Timed up and go (TUG): 14.13 10 meter walk test: 9.19 sec  (3.57 ft/sec) 360 turn R:  30.78 sec, festination>30 steps To L:  17 steps, 7.59 sec 50M back:  16.82 sec (0.6 ft/sec) TUG cognitive:  18.38 sec    GOALS: Goals reviewed with patient? Yes  SHORT TERM GOALS: Target date: 09/01/2023  Pt will be independent with HEP for improved balance, gait, posture. Baseline: Goal status: INITIAL  2.  Pt will improve TUG/TUG cognitive score to less than or equal to 10% difference for decreased fall risk. Baseline: 14.13/18.38 Goal status: INITIAL  3.  Pt/wife will report at least 50% improvement in bed mobility and transfers, for decreased caregiver burden. Baseline:  Goal status:  INITIAL   LONG TERM GOALS: Target date:  09/29/2023   Pt will be independent with HEP for improved balance, gait, posture, functional mobility. Baseline:  Goal status: INITIAL  2.  Pt will verbalize understanding of local Parkinson's disease community resources, including optimal fitness upon d/c from PT. Baseline:  Goal status: INITIAL  3.  Pt will perform 360 degree turns to R and L in <10-12 steps, for improved balance. Baseline:  Goal status: INITIAL  4.  MiniBESTest score to improve by at least 5 points for decreased fall risk. Baseline: 21/28 Goal status: IN PROGRESS  5.  Backwards gait velocity in 50M walk to improve to 1 ft/sec for improved balance. Baseline: 0.6 ft/sec Goal status: INITIAL  ASSESSMENT:  CLINICAL IMPRESSION: Patient arrived to session without new complaints. Reviewed HEP with cueing for best positioning; trunk rigidity is evident with these movements. Initiated standing PWR moves in parallel bars for safety. Patient performed activities with good stability but did require some cues for ideal positioning and larger amplitude. More trouble evident when sequencing moves together d/t memory vs. coordination. Updated HEP with these movements with edu for safety- patient reported understanding. No complaints upon leaving.   OBJECTIVE IMPAIRMENTS: Abnormal gait, decreased balance, decreased knowledge of use of DME, decreased mobility, difficulty walking, decreased ROM, decreased strength, impaired flexibility, impaired tone, and postural dysfunction.   ACTIVITY LIMITATIONS: transfers, bed mobility, dressing, reach over head, hygiene/grooming, and locomotion level  PARTICIPATION LIMITATIONS: community activity, yard work, and fitness, travel  PERSONAL FACTORS: 3+ comorbidities: see above  are also affecting patient's functional outcome.   REHAB POTENTIAL: Good  CLINICAL DECISION MAKING: Evolving/moderate complexity  EVALUATION COMPLEXITY:  Moderate  PLAN:  PT FREQUENCY: 2x/week  PT DURATION: 8 weeks plus eval  PLANNED INTERVENTIONS: 97110-Therapeutic exercises, 97530- Therapeutic activity, [id number]- Neuromuscular re-education, 97535- Self Care, 97140- Manual therapy, [phone]- Gait training, Patient/Family education, Balance training, Stair training, DME instructions, and Moist heat  PLAN FOR NEXT SESSION: review HEP-likely some PWR! Moves-standing, supine (to help with bed mobility);  will need to include wife in education    Anette Guarneri, [place], DPT 08/09/23 11:00 AM  [facility] at [facility] [address] [place], [address] [place], [place] [address] Phone # [phone] Fax # [phone]

## 2023-08-09 ENCOUNTER — Ambulatory Visit: Payer: Medicare Other | Admitting: Physical Therapy

## 2023-08-09 ENCOUNTER — Encounter: Payer: Self-pay | Admitting: Physical Therapy

## 2023-08-09 DIAGNOSIS — R29818 Other symptoms and signs involving the nervous system: Secondary | ICD-10-CM | POA: Diagnosis not present

## 2023-08-09 DIAGNOSIS — R2681 Unsteadiness on feet: Secondary | ICD-10-CM

## 2023-08-09 DIAGNOSIS — R41841 Cognitive communication deficit: Secondary | ICD-10-CM | POA: Diagnosis not present

## 2023-08-09 DIAGNOSIS — R293 Abnormal posture: Secondary | ICD-10-CM

## 2023-08-09 DIAGNOSIS — G20B2 Parkinson's disease with dyskinesia, with fluctuations: Secondary | ICD-10-CM | POA: Diagnosis not present

## 2023-08-09 DIAGNOSIS — R1312 Dysphagia, oropharyngeal phase: Secondary | ICD-10-CM | POA: Diagnosis not present

## 2023-08-09 DIAGNOSIS — R2689 Other abnormalities of gait and mobility: Secondary | ICD-10-CM | POA: Diagnosis not present

## 2023-08-09 DIAGNOSIS — R471 Dysarthria and anarthria: Secondary | ICD-10-CM | POA: Diagnosis not present

## 2023-08-09 NOTE — Patient Instructions (Signed)
  [email or web address]  Scroll down to "standing" and watch the video on the right 2x; perform daily

## 2023-08-14 ENCOUNTER — Ambulatory Visit: Payer: Medicare Other | Attending: Neurology

## 2023-08-14 DIAGNOSIS — R2681 Unsteadiness on feet: Secondary | ICD-10-CM | POA: Insufficient documentation

## 2023-08-14 DIAGNOSIS — R29818 Other symptoms and signs involving the nervous system: Secondary | ICD-10-CM | POA: Diagnosis not present

## 2023-08-14 DIAGNOSIS — R293 Abnormal posture: Secondary | ICD-10-CM | POA: Diagnosis not present

## 2023-08-14 DIAGNOSIS — R2689 Other abnormalities of gait and mobility: Secondary | ICD-10-CM | POA: Diagnosis not present

## 2023-08-14 NOTE — Therapy (Signed)
OUTPATIENT PHYSICAL THERAPY NEURO TREATMENT   Patient Name: Barry Pope MRN: [id number] DOB:02-21-1946, 77 y.o., male Today's Date: 08/14/2023   PCP: [name], MD REFERRING PROVIDER: Vladimir Faster, DO  END OF SESSION:  PT End of Session - 08/14/23 0943     Visit Number 4    Number of Visits 17    Date for PT Re-Evaluation 09/29/23    Authorization Type BCBS Medicare    Progress Note Due on Visit 10    PT Start Time 0930    PT Stop Time 1015    PT Time Calculation (min) 45 min    Equipment Utilized During Treatment Gait belt    Activity Tolerance Patient tolerated treatment well    Behavior During Therapy WFL for tasks assessed/performed              Past Medical History:  Diagnosis Date   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   Allergic rhinitis    Dysphonia 08/27/2019   Gastroesophageal reflux disease 03/26/2019   History of malignant carcinoid tumor of large intestine    History of malignant carcinoid tumor of small intestine    Insomnia    Knee osteoarthritis    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   Male erectile dysfunction, unspecified    Parkinson's disease 12/09/2019   Rosacea, unspecified    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   Vocal fold atrophy    Past Surgical History:  Procedure Laterality Date   APPENDECTOMY     COLONOSCOPY  2004/2009/2015   INGUINAL HERNIA REPAIR Right    KNEE SURGERY     PARTIAL COLECTOMY     Patient Active Problem List   Diagnosis Date Noted   Mesenteric mass 07/08/2022   History of malignant carcinoid tumor 07/08/2022   Muscular deconditioning 01/30/2022   Acute metabolic encephalopathy 01/28/2022   History of malignant carcinoid tumor of small intestine 01/27/2022   Insomnia 01/27/2022   Mild cognitive impairment 01/27/2022   Abnormal CXR 01/27/2022   Abnormal CT scan 01/27/2022   Hyponatremia 01/27/2022   Transaminitis 01/27/2022   Parkinson's disease 12/09/2019   Allergies    Dysphonia 08/27/2019    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  Back is feeling ok  Pt accompanied by: self  PERTINENT HISTORY: PD, memory change (suspect mild/mod PDD), depression,RBD, RLS, and insomnia, low back pain  PAIN:  Are you having pain? Yes: NPRS scale: 0/10 Pain location: back Pain description: muscle weakness Aggravating factors: bending over, standing too long Relieving factors: sitting  PRECAUTIONS: Fall  RED FLAGS: None   WEIGHT BEARING RESTRICTIONS: No  FALLS: Has patient fallen in last 6 months? Yes. Number of falls 3 3 falls last week (at the beach)  LIVING ENVIRONMENT: Lives with: lives with their spouse Lives in: House/apartment Stairs: Yes: Internal: 16 steps; on right going up and External: 2-3 steps; on right going up Has following equipment at home: Single point cane  PLOF: Independent, Leisure: works out at [employer] several days/wk, and needing more assistance from wife for bed mobility and for dressing  PATIENT GOALS: Patient:  to help with balance and walking.  Wife:  to help with more movement in shoulders and help with better mobility.  OBJECTIVE:    TODAY'S TREATMENT: 08/14/23 Activity Comments  NU-step speed intervals x 8 min. For  high-intensity and rapid alternating movement 2 min warm-up 30 sec 90+ SPM 30 sec 70-80 1 min cool down focus on large amplitude  Seated PNF chops 1x10 Standing PNF chops 1x10 Gross motor coordination 1x10 Blue medicine ball  HEP review -LTR 10x -open book stretch -prone on elbows reach 10x              TODAY'S TREATMENT: 08/09/23 Activity Comments  review of HEP: LTR 10x open book stretch 10x each  prone on elbows reach 10x  Cues to prevent LB from lifting off mat with LTR. Avoiding knees lifting up with open book. Increased amplitude with reaching and increased wt shift    standing PWR moves in II bars: Up 10x Rock 10x Twist 2x10x step 10x Cueing for larger palms, eye boost, pivoting on toes with PWR twist   standing PWR FLOW in II bars: 5x Demo and verbal cues throughout; pt notes difficulty remembering movements   1/2 turns in II bars , then with added ball toss Focus on large efficiency steps ; narrow BOS trunk flexed, often not achieving full turn               PATIENT EDUCATION: Education details: HEP update with standing PWR moves flow to be performed near counter and provided video link Person educated: Patient Education method: Explanation, Demonstration, Tactile cues, Verbal cues, and Handouts Education comprehension: verbalized understanding and returned demonstration   HOME EXERCISE PROGRAM: Access Code: [email or web address] URL: [email or web address] Date: 08/07/2023 Prepared by: Shary Decamp  Exercises - Supine Lower Trunk Rotation  - 1 x daily - 7 x weekly - 1-3 sets - 10 reps - Sidelying Open Book Thoracic Lumbar Rotation and Extension  - 1 x daily - 7 x weekly - 3 sets - 10 reps - Prone on Elbows Reach  - 1 x daily - 7 x weekly - 1-3 sets - 10 reps - Seated Chop and Lift with Ball   - 1 x daily - 7 x weekly - 1-3 sets - 10 reps - Standing Diagonal Chops with Medicine Ball  - 1 x daily - 7 x weekly - 1-3 sets - 10 reps  + standing PWR FLOW  video 2x/day near counter top     Note: Objective measures were completed at Evaluation unless otherwise noted.  DIAGNOSTIC FINDINGS:  NA  COGNITION: Overall cognitive status: History of cognitive impairments - at baseline   COORDINATION: Slowed alternating taps  MUSCLE TONE: LLE: Mild  POSTURE: rounded shoulders, forward head, and posterior pelvic tilt  UE ROM:  approx 100 degrees shoulder flex bilaterally, elbows are flexed   WFL abduction bilaterally, elbows flexed   WFL internal and external rotation,slowed  LOWER EXTREMITY ROM:     Active  Right Eval Left Eval  Hip flexion    Hip extension    Hip abduction    Hip adduction    Hip internal rotation    Hip external rotation    Knee flexion    Knee extension -12 -10  Ankle dorsiflexion    Ankle plantarflexion    Ankle inversion    Ankle eversion     (Blank rows = not tested)  LOWER EXTREMITY MMT:    MMT Right Eval Left Eval  Hip flexion 5 4+  Hip extension    Hip abduction 5 5  Hip adduction 4 4  Hip internal rotation    Hip external rotation    Knee flexion 5 4+  Knee extension 5 5  Ankle dorsiflexion 4+ 4  Ankle plantarflexion    Ankle inversion    Ankle eversion    (Blank rows = not tested)  BED MOBILITY:  Reports difficulty and needs wife's assistance   TRANSFERS: Assistive device utilized: None  Sit to stand: SBA Stand to sit: SBA *Reports difficulty with sit to stand from low surfaces  GAIT: Gait pattern: step through pattern, decreased arm swing- Right, decreased arm swing- Left, decreased stride length, shuffling, trunk flexed, and narrow BOS Distance walked: 50 ft x 2 Assistive device utilized: None Level of assistance: SBA Comments: Wife reports shuffling gait at home.  FUNCTIONAL TESTS:  5 times sit to stand: 10.38 sec arms crossed at chest Timed up and go (TUG): 14.13 10 meter walk test: 9.19 sec  (3.57 ft/sec) 360 turn R:  30.78 sec, festination>30 steps To L:  17 steps,  7.59 sec 50M back:  16.82 sec (0.6 ft/sec) TUG cognitive:  18.38 sec    GOALS: Goals reviewed with patient? Yes  SHORT TERM GOALS: Target date: 09/01/2023  Pt will be independent with HEP for improved balance, gait, posture. Baseline: Goal status: IN PROGRESS  2.  Pt will improve TUG/TUG cognitive score to less than or equal to 10% difference for decreased fall risk. Baseline: 14.13/18.38 Goal status: INITIAL  3.  Pt/wife will report at least 50% improvement in bed mobility and transfers, for decreased caregiver burden. Baseline:  Goal status: INITIAL   LONG TERM GOALS: Target date:  09/29/2023   Pt will be independent with HEP for improved balance, gait, posture, functional mobility. Baseline:  Goal status: INITIAL  2.  Pt will verbalize understanding of local Parkinson's disease community resources, including optimal fitness upon d/c from PT. Baseline:  Goal status: INITIAL  3.  Pt will perform 360 degree turns to R and L in <10-12 steps, for improved balance. Baseline:  Goal status: INITIAL  4.  MiniBESTest score to improve by at least 5 points for decreased fall risk. Baseline: 21/28 Goal status: IN PROGRESS  5.  Backwards gait velocity in 50M walk to improve to 1 ft/sec for improved balance. Baseline: 0.6 ft/sec Goal status: INITIAL  ASSESSMENT:  CLINICAL IMPRESSION: Initiated session with speed intervals on NU-step to promote rapid alternating movements followed by PNF chops with medicine ball to promote core/trunk engagement/rotation with resistance to improve  reciprocal motion of gait. Gross motor coordination with medicine ball to promote explosive power and coordination with various throwing forms/techniques. Cues in foot position and generating power from ground up to launch medicine ball with improved carryover. HEP review with emphasis on trunk rotation ROM/flexibility to improve reciprocal mobility. Cues for set-up and sequence with these activities,  printed material provided for PNF chops for gym performance. Verbalizes understanding. Continued sessions to progress POC details  OBJECTIVE IMPAIRMENTS: Abnormal gait, decreased balance, decreased knowledge of use of DME, decreased mobility, difficulty walking, decreased ROM, decreased strength, impaired flexibility, impaired tone, and postural dysfunction.   ACTIVITY LIMITATIONS: transfers, bed mobility, dressing, reach over head, hygiene/grooming, and locomotion level  PARTICIPATION LIMITATIONS: community activity, yard work, and fitness, travel  PERSONAL FACTORS: 3+ comorbidities: see above  are also affecting patient's functional outcome.   REHAB POTENTIAL: Good  CLINICAL DECISION MAKING: Evolving/moderate complexity  EVALUATION COMPLEXITY: Moderate  PLAN:  PT FREQUENCY: 2x/week  PT DURATION: 8 weeks plus eval  PLANNED INTERVENTIONS: 97110-Therapeutic exercises, 97530- Therapeutic activity, [id number]- Neuromuscular re-education, 97535- Self Care, [id number]- Manual therapy, [phone]- Gait training, Patient/Family education, Balance training, Stair training, DME instructions, and Moist heat  PLAN FOR NEXT SESSION: review HEP-likely some PWR! Moves-standing, supine (to help with bed mobility);  will need to include wife in education    10:14 AM, 08/14/23 M. Shary Decamp, PT, DPT Physical Therapist- [place] Office Number: [phone]

## 2023-08-17 ENCOUNTER — Other Ambulatory Visit (HOSPITAL_BASED_OUTPATIENT_CLINIC_OR_DEPARTMENT_OTHER): Payer: Self-pay

## 2023-08-21 ENCOUNTER — Encounter: Payer: Self-pay | Admitting: Physical Therapy

## 2023-08-21 ENCOUNTER — Ambulatory Visit: Payer: Medicare Other | Admitting: Physical Therapy

## 2023-08-21 DIAGNOSIS — R293 Abnormal posture: Secondary | ICD-10-CM

## 2023-08-21 DIAGNOSIS — R2689 Other abnormalities of gait and mobility: Secondary | ICD-10-CM | POA: Diagnosis not present

## 2023-08-21 DIAGNOSIS — R2681 Unsteadiness on feet: Secondary | ICD-10-CM

## 2023-08-21 DIAGNOSIS — R29818 Other symptoms and signs involving the nervous system: Secondary | ICD-10-CM | POA: Diagnosis not present

## 2023-08-21 NOTE — Therapy (Signed)
OUTPATIENT PHYSICAL THERAPY NEURO TREATMENT   Patient Name: Barry Pope MRN: [id number] DOB:Mar 09, 1946, 77 y.o., male Today's Date: 08/21/2023   PCP: [name], MD REFERRING PROVIDER: Vladimir Faster, DO  END OF SESSION:  PT End of Session - 08/21/23 0941     Visit Number 5    Number of Visits 17    Date for PT Re-Evaluation 09/29/23    Authorization Type BCBS Medicare    Progress Note Due on Visit 10    PT Start Time 0934    PT Stop Time 1016    PT Time Calculation (min) 42 min    Equipment Utilized During Treatment Gait belt    Activity Tolerance Patient tolerated treatment well    Behavior During Therapy WFL for tasks assessed/performed               Past Medical History:  Diagnosis Date   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   Allergic rhinitis    Dysphonia 08/27/2019   Gastroesophageal reflux disease 03/26/2019   History of malignant carcinoid tumor of large intestine    History of malignant carcinoid tumor of small intestine    Insomnia    Knee osteoarthritis    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   Male erectile dysfunction, unspecified    Parkinson's disease 12/09/2019   Rosacea, unspecified    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   Vocal fold atrophy    Past Surgical History:  Procedure Laterality Date   APPENDECTOMY     COLONOSCOPY  2004/2009/2015   INGUINAL HERNIA REPAIR Right    KNEE SURGERY     PARTIAL COLECTOMY     Patient Active Problem List   Diagnosis Date Noted   Mesenteric mass 07/08/2022   History of malignant carcinoid tumor 07/08/2022   Muscular deconditioning 01/30/2022   Acute metabolic encephalopathy 01/28/2022   History of malignant carcinoid tumor of small intestine 01/27/2022   Insomnia 01/27/2022   Mild cognitive impairment 01/27/2022   Abnormal CXR 01/27/2022   Abnormal CT scan 01/27/2022   Hyponatremia 01/27/2022   Transaminitis 01/27/2022   Parkinson's disease 12/09/2019   Allergies    Dysphonia 08/27/2019    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  Wife wants me to use the cane.   Pt accompanied by: self  PERTINENT HISTORY: PD, memory change (suspect mild/mod PDD), depression,RBD, RLS, and insomnia, low back pain  PAIN:  Are you having pain? Yes: NPRS scale: 0/10 Pain location: back Pain description: muscle weakness Aggravating factors: bending over, standing too long Relieving factors: sitting  PRECAUTIONS: Fall  RED FLAGS: None   WEIGHT BEARING RESTRICTIONS: No  FALLS: Has patient fallen in last 6 months? Yes. Number of falls 3 3 falls last week (at the beach)  LIVING ENVIRONMENT: Lives with: lives with their spouse Lives in: House/apartment Stairs: Yes: Internal: 16 steps; on right going up and External: 2-3 steps; on right going up Has following equipment at home: Single point cane  PLOF: Independent, Leisure: works out at [employer] several days/wk, and needing more assistance from wife for bed mobility and for dressing  PATIENT GOALS: Patient:  to help with balance and walking.  Wife:  to help with more movement in shoulders and help with better mobility.  OBJECTIVE:    TODAY'S TREATMENT: 08/21/2023 Activity Comments  NuStep Intervals for aerobic warm up, Level 3-5, 4 extremities, 30 sec heavy, 1  min light, 10 min total Level 5>80 SPM, Level 3>90-100 SPM Rates RPE at 6/10  Standing PWR! Moves: PWR! Up PWR! [employer]! Twist PWR! Step Standing in front of mat table 10 reps each Visual and verbal cues  Forward/back walking along counter, 5 reps For increased step length-cues for taking less steps (8 steps>5 steps)  Gait training with cane, 50 ft x 2, 85 ft x 2 Cues and assist for cane sequence, very narrow BOS and   Gait with rollator 50 ft, then 115 ft Supervision and cues for long strides  Gait with cane out to lobby, 40 ft Good sequence and min guard, does tend to look more at floor       PATIENT EDUCATION: Education details: importance of consistent, daily performance of HEP, especially PWR! Moves; cane sequence and initiated discussion about cane versus rollator Person educated: Patient Education method: Explanation, Demonstration, Tactile cues, Verbal cues, and Handouts Education comprehension: verbalized understanding and returned demonstration   HOME EXERCISE PROGRAM: Access Code: [email or web address] URL: [email or web address] Date: 08/07/2023 Prepared by: Shary Decamp  Exercises - Supine Lower Trunk Rotation  - 1 x daily - 7 x weekly - 1-3 sets - 10 reps - Sidelying Open Book Thoracic Lumbar Rotation and Extension  - 1 x daily - 7 x weekly - 3 sets - 10 reps - Prone on Elbows Reach  - 1 x daily - 7 x weekly - 1-3 sets - 10 reps - Seated Chop and Lift with Ball   - 1 x daily - 7 x weekly - 1-3 sets - 10 reps - Standing Diagonal Chops with Medicine Ball  - 1 x daily - 7 x weekly - 1-3 sets - 10 reps  + standing PWR FLOW video 2x/day near counter top     Note: Objective measures were completed at Evaluation unless otherwise noted.  DIAGNOSTIC FINDINGS: NA  COGNITION: Overall cognitive status: History of cognitive impairments - at baseline   COORDINATION: Slowed alternating taps  MUSCLE TONE: LLE: Mild  POSTURE: rounded shoulders, forward head, and  posterior pelvic tilt  UE ROM:  approx 100 degrees shoulder flex bilaterally, elbows are flexed   WFL abduction bilaterally, elbows flexed   [facility] internal and external rotation,slowed  LOWER EXTREMITY ROM:  Active  Right Eval Left Eval  Hip flexion    Hip extension    Hip abduction    Hip adduction    Hip internal rotation    Hip external rotation    Knee flexion    Knee extension -12 -10  Ankle dorsiflexion    Ankle plantarflexion    Ankle inversion    Ankle eversion     (Blank rows = not tested)  LOWER EXTREMITY MMT:    MMT Right Eval Left Eval  Hip flexion 5 4+  Hip extension    Hip abduction 5 5  Hip adduction 4 4  Hip internal rotation    Hip external rotation    Knee flexion 5 4+  Knee extension 5 5  Ankle dorsiflexion 4+ 4  Ankle plantarflexion    Ankle inversion    Ankle eversion    (Blank rows = not tested)  BED MOBILITY:  Reports difficulty and needs wife's assistance   TRANSFERS: Assistive device utilized: None  Sit to stand: SBA Stand to sit: SBA *Reports difficulty with sit to stand from low surfaces  GAIT: Gait pattern: step through pattern, decreased arm swing- Right, decreased arm swing- Left, decreased stride length, shuffling, trunk flexed, and narrow BOS Distance walked: 50 ft x 2 Assistive device utilized: None Level of assistance: SBA Comments: Wife reports shuffling gait at home.  FUNCTIONAL TESTS:  5 times sit to stand: 10.38 sec arms crossed at chest Timed up and go (TUG): 14.13 10 meter walk test: 9.19 sec  (3.57 ft/sec) 360 turn R:  30.78 sec, festination>30 steps To L:  17 steps, 7.59 sec 49M back:  16.82 sec (0.6 ft/sec) TUG cognitive:  18.38 sec    GOALS: Goals reviewed with patient? Yes  SHORT TERM GOALS: Target date: 09/01/2023  Pt will be independent with HEP for improved balance, gait, posture. Baseline: Goal status: IN PROGRESS  2.  Pt will improve TUG/TUG cognitive score to less than or equal to 10%  difference for decreased fall risk. Baseline: 14.13/18.38 Goal status: IN PROGRESS  3.  Pt/wife will report at least 50% improvement in bed mobility and transfers, for decreased caregiver burden. Baseline:  Goal status: IN PROGRESS   LONG TERM GOALS: Target date:  09/29/2023   Pt will be independent with HEP for improved balance, gait, posture, functional mobility. Baseline:  Goal status: IN PROGRESS  2.  Pt will verbalize understanding of local Parkinson's disease community resources, including optimal fitness upon d/c from PT. Baseline:  Goal status: IN PROGRESS  3.  Pt will perform 360 degree turns to R and L in <10-12 steps, for improved balance. Baseline:  Goal status: IN PROGRESS  4.  MiniBESTest score to improve by at least 5 points for decreased fall risk. Baseline: 21/28 Goal status: IN PROGRESS  5.  Backwards gait velocity in 49M walk to improve to 1 ft/sec for improved balance. Baseline: 0.6 ft/sec Goal status: IN PROGRESS  ASSESSMENT:  CLINICAL IMPRESSION: Pt arrives today with a cane, and reports wife would like him to use it.  With initial trial of cane coming from lobby to gym, he takes very small, shuffling steps making it difficulty with sequence cane appropriately.  Utilized Nustep for interval aerobic training as well standing PWR! Moves to work on large amplitude movement patterns.  With trial of cane after these exercises, pt takes longer steps, and needs initial hand over hand cueing for sequence.  He is able to maintain appropriate sequence for 25-50 ft,  but if he turns or has to change directions, he reverts to smaller, narrowed steps and more difficulty with sequencing cane.  Discussed that rollator/walker may be a better option for stability with gait, and trialed this today.  Pt reports "it feels more natural".  Pt would likely benefit from continued gait training with assistive device; rollator may be a better option for community with cane being an option  for home.    OBJECTIVE IMPAIRMENTS: Abnormal gait, decreased balance, decreased knowledge of use of DME, decreased mobility, difficulty walking, decreased ROM, decreased strength, impaired flexibility, impaired tone, and postural dysfunction.   ACTIVITY LIMITATIONS: transfers, bed mobility, dressing, reach over head, hygiene/grooming, and locomotion level  PARTICIPATION LIMITATIONS: community activity, yard work, and fitness, travel  PERSONAL FACTORS: 3+ comorbidities: see above  are also affecting patient's functional outcome.   REHAB POTENTIAL: Good  CLINICAL DECISION MAKING: Evolving/moderate complexity  EVALUATION COMPLEXITY: Moderate  PLAN:  PT FREQUENCY: 2x/week  PT DURATION: 8 weeks plus eval  PLANNED INTERVENTIONS: 97110-Therapeutic exercises, 97530- Therapeutic activity, [id number]- Neuromuscular re-education, 97535- Self Care, 33295- Manual therapy, [phone]- Gait training, Patient/Family education, Balance training, Stair training, DME instructions, and Moist heat  PLAN FOR NEXT SESSION: Gait training with cane/rollator.  Initiate session with Nustep for interval aerobic training and review PWR! Moves-standing.  Try supine PWR! Moves (to help with bed mobility);  will need to include wife in education (ask about wife coming in for part of sessions)    Lonia Blood, PT 08/21/23 12:03 PM Phone: [phone] Fax: [phone]

## 2023-08-22 NOTE — Therapy (Signed)
OUTPATIENT PHYSICAL THERAPY NEURO TREATMENT   Patient Name: Barry Pope MRN: [id number] DOB:Mar 29, 1946, 77 y.o., male Today's Date: 08/23/2023   PCP: [name], MD REFERRING PROVIDER: Vladimir Faster, DO  END OF SESSION:  PT End of Session - 08/23/23 1315     Visit Number 6    Number of Visits 17    Date for PT Re-Evaluation 09/29/23    Authorization Type BCBS Medicare    Progress Note Due on Visit 10    PT Start Time 1232    PT Stop Time 1315    PT Time Calculation (min) 43 min    Equipment Utilized During Treatment Gait belt    Activity Tolerance Patient tolerated treatment well    Behavior During Therapy WFL for tasks assessed/performed                Past Medical History:  Diagnosis Date   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   Allergic rhinitis    Dysphonia 08/27/2019   Gastroesophageal reflux disease 03/26/2019   History of malignant carcinoid tumor of large intestine    History of malignant carcinoid tumor of small intestine    Insomnia    Knee osteoarthritis    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   Male erectile dysfunction, unspecified    Parkinson's disease 12/09/2019   Rosacea, unspecified    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   Vocal fold atrophy    Past Surgical History:  Procedure Laterality Date   APPENDECTOMY     COLONOSCOPY  2004/2009/2015   INGUINAL HERNIA REPAIR Right    KNEE SURGERY     PARTIAL COLECTOMY     Patient Active Problem List   Diagnosis Date Noted   Mesenteric mass 07/08/2022   History of malignant carcinoid tumor 07/08/2022   Muscular deconditioning 01/30/2022   Acute metabolic encephalopathy 01/28/2022   History of malignant carcinoid tumor of small intestine 01/27/2022   Insomnia 01/27/2022   Mild cognitive impairment 01/27/2022   Abnormal CXR 01/27/2022   Abnormal CT scan 01/27/2022   Hyponatremia 01/27/2022   Transaminitis 01/27/2022   Parkinson's disease 12/09/2019   Allergies    Dysphonia 08/27/2019    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 Wife requesting to try rollator as she thinks it is time he uses it.  Pt accompanied by: self, wife present at start of session   PERTINENT HISTORY: PD, memory change (suspect mild/mod PDD), depression,RBD, RLS, and insomnia, low back pain  PAIN:  Are you having pain? Yes: NPRS scale: 0/10 Pain location: back Pain description: muscle weakness Aggravating factors: bending over, standing too long Relieving factors: sitting  PRECAUTIONS: Fall  RED FLAGS: None   WEIGHT BEARING RESTRICTIONS: No  FALLS: Has patient fallen in last 6 months? Yes. Number of falls 3 3 falls last week (at the beach)  LIVING ENVIRONMENT: Lives with: lives with their spouse Lives in: House/apartment Stairs: Yes: Internal: 16 steps; on right going up and External: 2-3 steps; on right going up Has following equipment at home: Single point cane  PLOF: Independent, Leisure: works out at [employer] several days/wk, and needing more assistance from wife for bed mobility and for dressing  PATIENT GOALS: Patient:  to help with balance and walking.  Wife:  to help with more movement in shoulders and help with better  mobility.  OBJECTIVE:     TODAY'S TREATMENT: 08/22/23 Activity Comments  Nustep L5 x 6 min UEs/LEs Maintaining ~85 SPM with good amplitude   Gait training, trialing SPC, quad tip cane, RW, 4WW 4x178ft  Difficulty sequencing/coordinating cane and requires min A for cane sequencing.   Edu on walker safety with transfers. Gait speed slow with RW and difficulty completing turns   Better quicker pace with 4WW and smoother turns   Pt reports difficulty getting in/out of the car. Similar car transfers Most difficulty in stepping feet out and pivoting on bottom              PATIENT EDUCATION: Education details: edu on how to use each AD trialed today and educated pt on recommendation on using 4WW as best option based on performance today- wrote wife a note on this per her request  Person educated: Patient and Spouse Education method: Explanation, Demonstration, Tactile cues, Verbal cues, and Handouts Education comprehension: verbalized understanding and returned demonstration   HOME EXERCISE PROGRAM: Access Code: [email or web address] URL: [email or web address] Date: 08/07/2023 Prepared by: Shary Decamp  Exercises - Supine Lower Trunk Rotation  - 1 x daily - 7 x weekly - 1-3 sets - 10 reps - Sidelying Open Book Thoracic Lumbar Rotation and Extension  - 1 x daily - 7 x weekly - 3 sets - 10 reps - Prone on Elbows Reach  - 1 x daily - 7 x weekly - 1-3 sets - 10 reps - Seated Chop and Lift with Ball   - 1 x daily - 7 x weekly - 1-3 sets - 10 reps - Standing Diagonal Chops with Medicine Ball  - 1 x daily - 7 x weekly - 1-3 sets - 10 reps  + standing PWR FLOW video 2x/day near counter top     Note: Objective measures were completed at Evaluation unless otherwise noted.  DIAGNOSTIC FINDINGS: NA  COGNITION: Overall cognitive status: History of cognitive impairments - at baseline   COORDINATION: Slowed alternating taps  MUSCLE TONE: LLE: Mild  POSTURE: rounded shoulders,  forward head, and posterior pelvic tilt  UE ROM:  approx 100 degrees shoulder flex bilaterally, elbows are flexed   WFL abduction bilaterally, elbows flexed   WFL internal and external rotation,slowed  LOWER EXTREMITY ROM:     Active  Right Eval  Left Eval  Hip flexion    Hip extension    Hip abduction    Hip adduction    Hip internal rotation    Hip external rotation    Knee flexion    Knee extension -12 -10  Ankle dorsiflexion    Ankle plantarflexion    Ankle inversion    Ankle eversion     (Blank rows = not tested)  LOWER EXTREMITY MMT:    MMT Right Eval Left Eval  Hip flexion 5 4+  Hip extension    Hip abduction 5 5  Hip adduction 4 4  Hip internal rotation    Hip external rotation    Knee flexion 5 4+  Knee extension 5 5  Ankle dorsiflexion 4+ 4  Ankle plantarflexion    Ankle inversion    Ankle eversion    (Blank rows = not tested)  BED MOBILITY:  Reports difficulty and needs wife's assistance   TRANSFERS: Assistive device utilized: None  Sit to stand: SBA Stand to sit: SBA *Reports difficulty with sit to stand from low surfaces  GAIT: Gait pattern: step through pattern, decreased arm swing- Right, decreased arm swing- Left, decreased stride length, shuffling, trunk flexed, and narrow BOS Distance walked: 50 ft x 2 Assistive device utilized: None Level of assistance: SBA Comments: Wife reports shuffling gait at home.  FUNCTIONAL TESTS:  5 times sit to stand: 10.38 sec arms crossed at chest Timed up and go (TUG): 14.13 10 meter walk test: 9.19 sec  (3.57 ft/sec) 360 turn R:  30.78 sec, festination>30 steps To L:  17 steps, 7.59 sec 38M back:  16.82 sec (0.6 ft/sec) TUG cognitive:  18.38 sec    GOALS: Goals reviewed with patient? Yes  SHORT TERM GOALS: Target date: 09/01/2023  Pt will be independent with HEP for improved balance, gait, posture. Baseline: Goal status: IN PROGRESS  2.  Pt will improve TUG/TUG cognitive score to less than  or equal to 10% difference for decreased fall risk. Baseline: 14.13/18.38 Goal status: IN PROGRESS  3.  Pt/wife will report at least 50% improvement in bed mobility and transfers, for decreased caregiver burden. Baseline:  Goal status: IN PROGRESS   LONG TERM GOALS: Target date:  09/29/2023   Pt will be independent with HEP for improved balance, gait, posture, functional mobility. Baseline:  Goal status: IN PROGRESS  2.  Pt will verbalize understanding of local Parkinson's disease community resources, including optimal fitness upon d/c from PT. Baseline:  Goal status: IN PROGRESS  3.  Pt will perform 360 degree turns to R and L in <10-12 steps, for improved balance. Baseline:  Goal status: IN PROGRESS  4.  MiniBESTest score to improve by at least 5 points for decreased fall risk. Baseline: 21/28 Goal status: IN PROGRESS  5.  Backwards gait velocity in 38M walk to improve to 1 ft/sec for improved balance. Baseline: 0.6 ft/sec Goal status: IN PROGRESS  ASSESSMENT:  CLINICAL IMPRESSION: Patient arrived to session with wife who requests that he be given a recommendation on what AD to use. Reports she believes it is time he uses a rollator. Trialed several different AD's today. Patient seemed to do best with 4WW, thus recommended that he purchase this device for max safety with gait and improved gait speed. End of session worked on simulation of car transfers as patient reports difficulty with this. No complaints at end of session.      OBJECTIVE IMPAIRMENTS: Abnormal gait, decreased balance, decreased knowledge of use of  DME, decreased mobility, difficulty walking, decreased ROM, decreased strength, impaired flexibility, impaired tone, and postural dysfunction.   ACTIVITY LIMITATIONS: transfers, bed mobility, dressing, reach over head, hygiene/grooming, and locomotion level  PARTICIPATION LIMITATIONS: community activity, yard work, and fitness, travel  PERSONAL FACTORS: 3+  comorbidities: see above  are also affecting patient's functional outcome.   REHAB POTENTIAL: Good  CLINICAL DECISION MAKING: Evolving/moderate complexity  EVALUATION COMPLEXITY: Moderate  PLAN:  PT FREQUENCY: 2x/week  PT DURATION: 8 weeks plus eval  PLANNED INTERVENTIONS: 97110-Therapeutic exercises, 97530- Therapeutic activity, [id number]- Neuromuscular re-education, 97535- Self Care, 16109- Manual therapy, [phone]- Gait training, Patient/Family education, Balance training, Stair training, DME instructions, and Moist heat  PLAN FOR NEXT SESSION: Gait training with cane/rollator.  Initiate session with Nustep for interval aerobic training and review PWR! Moves-standing.  Try supine PWR! Moves (to help with bed mobility);  will need to include wife in education (ask about wife coming in for part of sessions); car transfers    Anette Guarneri, [place], DPT 08/23/23 1:17 PM  [facility] [address], [address] [place], [place] [address] Phone # [phone] Fax # [phone]

## 2023-08-23 ENCOUNTER — Encounter: Payer: Self-pay | Admitting: Physical Therapy

## 2023-08-23 ENCOUNTER — Ambulatory Visit: Payer: Medicare Other | Admitting: Physical Therapy

## 2023-08-23 DIAGNOSIS — R293 Abnormal posture: Secondary | ICD-10-CM | POA: Diagnosis not present

## 2023-08-23 DIAGNOSIS — R2689 Other abnormalities of gait and mobility: Secondary | ICD-10-CM

## 2023-08-23 DIAGNOSIS — R2681 Unsteadiness on feet: Secondary | ICD-10-CM | POA: Diagnosis not present

## 2023-08-23 DIAGNOSIS — Z85828 Personal history of other malignant neoplasm of skin: Secondary | ICD-10-CM | POA: Diagnosis not present

## 2023-08-23 DIAGNOSIS — R29818 Other symptoms and signs involving the nervous system: Secondary | ICD-10-CM | POA: Diagnosis not present

## 2023-08-23 DIAGNOSIS — L218 Other seborrheic dermatitis: Secondary | ICD-10-CM | POA: Diagnosis not present

## 2023-08-23 DIAGNOSIS — D225 Melanocytic nevi of trunk: Secondary | ICD-10-CM | POA: Diagnosis not present

## 2023-08-23 DIAGNOSIS — L821 Other seborrheic keratosis: Secondary | ICD-10-CM | POA: Diagnosis not present

## 2023-08-24 ENCOUNTER — Ambulatory Visit (HOSPITAL_COMMUNITY)
Admission: RE | Admit: 2023-08-24 | Discharge: 2023-08-24 | Disposition: A | Discharge status: Home / Self Care | Payer: Medicare Other | Source: Ambulatory Visit | Attending: Family Medicine | Admitting: Family Medicine

## 2023-08-24 ENCOUNTER — Encounter (HOSPITAL_COMMUNITY): Payer: Self-pay

## 2023-08-24 ENCOUNTER — Ambulatory Visit (HOSPITAL_COMMUNITY)
Admission: RE | Admit: 2023-08-24 | Discharge: 2023-08-24 | Disposition: A | Discharge status: Home / Self Care | Payer: Medicare Other | Source: Ambulatory Visit | Attending: Neurology | Admitting: Neurology

## 2023-08-24 DIAGNOSIS — G20A1 Parkinson's disease without dyskinesia, without mention of fluctuations: Secondary | ICD-10-CM | POA: Diagnosis not present

## 2023-08-24 DIAGNOSIS — R1312 Dysphagia, oropharyngeal phase: Secondary | ICD-10-CM | POA: Diagnosis not present

## 2023-08-24 DIAGNOSIS — R638 Other symptoms and signs concerning food and fluid intake: Secondary | ICD-10-CM | POA: Diagnosis not present

## 2023-08-24 DIAGNOSIS — R471 Dysarthria and anarthria: Secondary | ICD-10-CM | POA: Insufficient documentation

## 2023-08-24 DIAGNOSIS — R41841 Cognitive communication deficit: Secondary | ICD-10-CM | POA: Insufficient documentation

## 2023-08-24 DIAGNOSIS — R1310 Dysphagia, unspecified: Secondary | ICD-10-CM | POA: Diagnosis not present

## 2023-08-28 ENCOUNTER — Ambulatory Visit: Payer: Medicare Other

## 2023-08-29 DIAGNOSIS — K080 Exfoliation of teeth due to systemic causes: Secondary | ICD-10-CM | POA: Diagnosis not present

## 2023-08-29 NOTE — Therapy (Signed)
OUTPATIENT PHYSICAL THERAPY NEURO TREATMENT   Patient Name: Barry Pope MRN: [id number] DOB:1946/04/22, 77 y.o., male Today's Date: 08/30/2023   PCP: [name], MD REFERRING PROVIDER: Vladimir Faster, DO  END OF SESSION:  PT End of Session - 08/30/23 1311     Visit Number 7    Number of Visits 17    Date for PT Re-Evaluation 09/29/23    Authorization Type BCBS Medicare    Progress Note Due on Visit 10    PT Start Time 1228    PT Stop Time 1313    PT Time Calculation (min) 45 min    Equipment Utilized During Treatment Gait belt    Activity Tolerance Patient tolerated treatment well    Behavior During Therapy WFL for tasks assessed/performed                 Past Medical History:  Diagnosis Date   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   Allergic rhinitis    Dysphonia 08/27/2019   Gastroesophageal reflux disease 03/26/2019   History of malignant carcinoid tumor of large intestine    History of malignant carcinoid tumor of small intestine    Insomnia    Knee osteoarthritis    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   Male erectile dysfunction, unspecified    Parkinson's disease 12/09/2019   Rosacea, unspecified    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   Vocal fold atrophy    Past Surgical History:  Procedure Laterality Date   APPENDECTOMY     COLONOSCOPY  2004/2009/2015   INGUINAL HERNIA REPAIR Right    KNEE SURGERY     PARTIAL COLECTOMY     Patient Active Problem List   Diagnosis Date Noted   Mesenteric mass 07/08/2022   History of malignant carcinoid tumor 07/08/2022   Muscular deconditioning 01/30/2022   Acute metabolic encephalopathy 01/28/2022   History of malignant carcinoid tumor of small intestine 01/27/2022   Insomnia 01/27/2022   Mild cognitive impairment 01/27/2022   Abnormal CXR 01/27/2022   Abnormal CT scan 01/27/2022   Hyponatremia 01/27/2022   Transaminitis 01/27/2022   Parkinson's disease 12/09/2019   Allergies    Dysphonia 08/27/2019    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 No falls.  Pt accompanied by: self  PERTINENT HISTORY: PD, memory change (suspect mild/mod PDD), depression,RBD, RLS, and insomnia, low back pain  PAIN:  Are you having pain? Yes: NPRS scale: 0/10 Pain location: back Pain description: muscle weakness Aggravating factors: bending over, standing too long Relieving factors: sitting  PRECAUTIONS: Fall  RED FLAGS: None   WEIGHT BEARING RESTRICTIONS: No  FALLS: Has patient fallen in last 6 months? Yes. Number of falls 3 3 falls last week (at the beach)  LIVING ENVIRONMENT: Lives with: lives with their spouse Lives in: House/apartment Stairs: Yes: Internal: 16 steps; on right going up and External: 2-3 steps; on right going up Has following equipment at home: Single point cane  PLOF: Independent, Leisure: works out at [employer] several days/wk, and needing more assistance from wife for bed mobility and for dressing  PATIENT GOALS: Patient:  to help with balance and walking.  Wife:  to help with more movement in shoulders and help with better mobility.  OBJECTIVE:    TODAY'S TREATMENT: 08/30/23 Activity Comments  Nustep L5 x 6 min UEs/LEs  Maintaining ~ 75SPM  gait training  with 4WW with focus on turns around objects and safety with transfers ~149ft Cueing to stand taller, pull walker towards him, keep feet b/w walker wheels. Edu on freezing tips during turns   Gait training with 4WW on sidewalk, curbs, ramps 51ft Heavy cueing for improved awareness of obstacles/items to his L as patient ran into things on the L a few times.   supine PWR moves for bed mobility: Up 10x  Rock 10x  Twist 10x  step  10x  Cueing for proper positioning, hand and eye boost necessary as well as verbal and manual cues     PATIENT EDUCATION: Education details: wrote note for pt to bring to wife requesting to bring him to one of his PT appointments to allow [place] to practice car transfers in his own car Person educated: Patient Education method: Explanation and Handouts Education comprehension: verbalized understanding  HOME EXERCISE PROGRAM: Access Code: [email or web address] URL: [email or web address] Date: 08/07/2023 Prepared by: Shary Decamp  Exercises - Supine Lower Trunk Rotation  - 1 x daily - 7 x weekly - 1-3 sets - 10 reps - Sidelying Open Book Thoracic Lumbar Rotation and Extension  - 1 x daily - 7 x weekly - 3 sets - 10 reps - Prone on Elbows Reach  - 1 x daily - 7 x weekly - 1-3 sets - 10 reps - Seated Chop and Lift with Ball   - 1 x daily - 7 x weekly - 1-3 sets - 10 reps - Standing Diagonal Chops with Medicine Ball  - 1 x daily - 7 x weekly - 1-3 sets - 10 reps  + standing PWR FLOW video 2x/day near counter top     Note: Objective measures were completed at Evaluation unless otherwise noted.  DIAGNOSTIC FINDINGS: NA  COGNITION: Overall cognitive status: History of cognitive impairments - at baseline   COORDINATION: Slowed alternating taps  MUSCLE TONE: LLE: Mild  POSTURE: rounded shoulders, forward head, and posterior pelvic tilt  UE ROM:  approx 100 degrees shoulder flex bilaterally, elbows are flexed   WFL abduction bilaterally, elbows flexed   WFL  internal and external rotation,slowed  LOWER EXTREMITY ROM:     Active  Right Eval Left Eval  Hip flexion    Hip extension    Hip abduction    Hip adduction  Hip internal rotation    Hip external rotation    Knee flexion    Knee extension -12 -10  Ankle dorsiflexion    Ankle plantarflexion    Ankle inversion    Ankle eversion     (Blank rows = not tested)  LOWER EXTREMITY MMT:    MMT Right Eval Left Eval  Hip flexion 5 4+  Hip extension    Hip abduction 5 5  Hip adduction 4 4  Hip internal rotation    Hip external rotation    Knee flexion 5 4+  Knee extension 5 5  Ankle dorsiflexion 4+ 4  Ankle plantarflexion    Ankle inversion    Ankle eversion    (Blank rows = not tested)  BED MOBILITY:  Reports difficulty and needs wife's assistance   TRANSFERS: Assistive device utilized: None  Sit to stand: SBA Stand to sit: SBA *Reports difficulty with sit to stand from low surfaces  GAIT: Gait pattern: step through pattern, decreased arm swing- Right, decreased arm swing- Left, decreased stride length, shuffling, trunk flexed, and narrow BOS Distance walked: 50 ft x 2 Assistive device utilized: None Level of assistance: SBA Comments: Wife reports shuffling gait at home.  FUNCTIONAL TESTS:  5 times sit to stand: 10.38 sec arms crossed at chest Timed up and go (TUG): 14.13 10 meter walk test: 9.19 sec  (3.57 ft/sec) 360 turn R:  30.78 sec, festination>30 steps To L:  17 steps, 7.59 sec 79M back:  16.82 sec (0.6 ft/sec) TUG cognitive:  18.38 sec    GOALS: Goals reviewed with patient? Yes  SHORT TERM GOALS: Target date: 09/01/2023  Pt will be independent with HEP for improved balance, gait, posture. Baseline: Goal status: IN PROGRESS  2.  Pt will improve TUG/TUG cognitive score to less than or equal to 10% difference for decreased fall risk. Baseline: 14.13/18.38 Goal status: IN PROGRESS  3.  Pt/wife will report at least 50% improvement in bed  mobility and transfers, for decreased caregiver burden. Baseline:  Goal status: IN PROGRESS   LONG TERM GOALS: Target date:  09/29/2023   Pt will be independent with HEP for improved balance, gait, posture, functional mobility. Baseline:  Goal status: IN PROGRESS  2.  Pt will verbalize understanding of local Parkinson's disease community resources, including optimal fitness upon d/c from PT. Baseline:  Goal status: IN PROGRESS  3.  Pt will perform 360 degree turns to R and L in <10-12 steps, for improved balance. Baseline:  Goal status: IN PROGRESS  4.  MiniBESTest score to improve by at least 5 points for decreased fall risk. Baseline: 21/28 Goal status: IN PROGRESS  5.  Backwards gait velocity in 79M walk to improve to 1 ft/sec for improved balance. Baseline: 0.6 ft/sec Goal status: IN PROGRESS  ASSESSMENT:  CLINICAL IMPRESSION: Patient arrived to session without new complaints. Continued working on gait training with 4WW inside and outside to ensure max safety. Patient has not yet gotten a walker of his own and arrived to session without AD. Will require continued practice performing transfers, turns, and gait along a path without L deviation. Practiced PWR! Moves in supine with most difficulty evident with supine wt shifting. Patient tolerated session well and without complaints upon leaving.      OBJECTIVE IMPAIRMENTS: Abnormal gait, decreased balance, decreased knowledge of use of DME, decreased mobility, difficulty walking, decreased ROM, decreased strength, impaired flexibility, impaired tone, and postural dysfunction.   ACTIVITY LIMITATIONS: transfers, bed mobility, dressing, reach over head,  hygiene/grooming, and locomotion level  PARTICIPATION LIMITATIONS: community activity, yard work, and fitness, travel  PERSONAL FACTORS: 3+ comorbidities: see above  are also affecting patient's functional outcome.   REHAB POTENTIAL: Good  CLINICAL DECISION MAKING:  Evolving/moderate complexity  EVALUATION COMPLEXITY: Moderate  PLAN:  PT FREQUENCY: 2x/week  PT DURATION: 8 weeks plus eval  PLANNED INTERVENTIONS: 97110-Therapeutic exercises, 97530- Therapeutic activity, [id number]- Neuromuscular re-education, 97535- Self Care, 16109- Manual therapy, [phone]- Gait training, Patient/Family education, Balance training, Stair training, DME instructions, and Moist heat  PLAN FOR NEXT SESSION: Gait training with cane/rollator.  Initiate session with Nustep for interval aerobic training and review PWR! Moves-standing.  Try supine PWR! Moves (to help with bed mobility);  will need to include wife in education (ask about wife coming in for part of sessions); car transfers    Anette Guarneri, [place], DPT 08/30/23 1:14 PM  [facility] at [facility] [address] [place], [place] [address] Phone # [phone] Fax # [phone]

## 2023-08-30 ENCOUNTER — Ambulatory Visit: Payer: Medicare Other | Admitting: Physical Therapy

## 2023-08-30 ENCOUNTER — Encounter: Payer: Self-pay | Admitting: Physical Therapy

## 2023-08-30 DIAGNOSIS — R2681 Unsteadiness on feet: Secondary | ICD-10-CM | POA: Diagnosis not present

## 2023-08-30 DIAGNOSIS — R2689 Other abnormalities of gait and mobility: Secondary | ICD-10-CM

## 2023-08-30 DIAGNOSIS — R293 Abnormal posture: Secondary | ICD-10-CM

## 2023-08-30 DIAGNOSIS — R29818 Other symptoms and signs involving the nervous system: Secondary | ICD-10-CM | POA: Diagnosis not present

## 2023-09-04 ENCOUNTER — Ambulatory Visit: Payer: Medicare Other

## 2023-09-04 ENCOUNTER — Other Ambulatory Visit (HOSPITAL_BASED_OUTPATIENT_CLINIC_OR_DEPARTMENT_OTHER): Payer: Self-pay

## 2023-09-04 ENCOUNTER — Other Ambulatory Visit: Payer: Self-pay

## 2023-09-04 DIAGNOSIS — R29818 Other symptoms and signs involving the nervous system: Secondary | ICD-10-CM | POA: Diagnosis not present

## 2023-09-04 DIAGNOSIS — R2689 Other abnormalities of gait and mobility: Secondary | ICD-10-CM

## 2023-09-04 DIAGNOSIS — R293 Abnormal posture: Secondary | ICD-10-CM

## 2023-09-04 DIAGNOSIS — R2681 Unsteadiness on feet: Secondary | ICD-10-CM

## 2023-09-04 NOTE — Therapy (Signed)
OUTPATIENT PHYSICAL THERAPY NEURO TREATMENT   Patient Name: Barry Pope MRN: [id number] DOB:03-26-1946, 77 y.o., male Today's Date: 09/04/2023   PCP: [name], MD REFERRING PROVIDER: Vladimir Faster, DO  END OF SESSION:  PT End of Session - 09/04/23 0933     Visit Number 8    Number of Visits 17    Date for PT Re-Evaluation 09/29/23    Authorization Type BCBS Medicare    Progress Note Due on Visit 10    PT Start Time 0930    PT Stop Time 1015    PT Time Calculation (min) 45 min    Equipment Utilized During Treatment Gait belt    Activity Tolerance Patient tolerated treatment well    Behavior During Therapy WFL for tasks assessed/performed                 Past Medical History:  Diagnosis Date   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   Allergic rhinitis    Dysphonia 08/27/2019   Gastroesophageal reflux disease 03/26/2019   History of malignant carcinoid tumor of large intestine    History of malignant carcinoid tumor of small intestine    Insomnia    Knee osteoarthritis    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   Male erectile dysfunction, unspecified    Parkinson's disease 12/09/2019   Rosacea, unspecified    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   Vocal fold atrophy    Past Surgical History:  Procedure Laterality Date   APPENDECTOMY     COLONOSCOPY  2004/2009/2015   INGUINAL HERNIA REPAIR Right    KNEE SURGERY     PARTIAL COLECTOMY     Patient Active Problem List   Diagnosis Date Noted   Mesenteric mass 07/08/2022   History of malignant carcinoid tumor 07/08/2022   Muscular deconditioning 01/30/2022   Acute metabolic encephalopathy 01/28/2022   History of malignant carcinoid tumor of small intestine 01/27/2022   Insomnia 01/27/2022   Mild cognitive impairment 01/27/2022   Abnormal CXR 01/27/2022   Abnormal CT scan 01/27/2022   Hyponatremia 01/27/2022   Transaminitis 01/27/2022   Parkinson's disease 12/09/2019   Allergies    Dysphonia 08/27/2019    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   Gastroesophageal reflux disease 03/26/2019   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    ONSET DATE: 06/27/2023 (MD referral)  REFERRING DIAG: G20.B2 (ICD-10-CM) - Parkinson's disease with dyskinesia and fluctuating manifestations (HCC)   THERAPY DIAG:  Other abnormalities of gait and mobility  Abnormal posture  Unsteadiness on feet  Other symptoms and signs involving the nervous system  Rationale for Evaluation and Treatment: Rehabilitation  SUBJECTIVE:  SUBJECTIVE STATEMENT: Having trouble getting in/out of car and in/out of bed  Pt accompanied by: self  PERTINENT HISTORY: PD, memory change (suspect mild/mod PDD), depression,RBD, RLS, and insomnia, low back pain  PAIN:  Are you having pain? Yes: NPRS scale: 0/10 Pain location: back Pain description: muscle weakness Aggravating factors: bending over, standing too long Relieving factors: sitting  PRECAUTIONS: Fall  RED FLAGS: None   WEIGHT BEARING RESTRICTIONS: No  FALLS: Has patient fallen in last 6 months? Yes. Number of falls 3 3 falls last week (at the beach)  LIVING ENVIRONMENT: Lives with: lives with their spouse Lives in: House/apartment Stairs: Yes: Internal: 16 steps; on right going up and External: 2-3 steps; on right going up Has following equipment at home: Single point cane  PLOF: Independent, Leisure: works out at [employer] several days/wk, and needing more assistance from wife for bed mobility and for dressing  PATIENT GOALS: Patient:  to help with balance and walking.  Wife:  to help with more movement in shoulders and help with better mobility.  OBJECTIVE:   TODAY'S TREATMENT: 09/04/23 Activity Comments  Car transfers Review of strategies and  devices  Bed mobility transfers Review of strategies and devices/adaptive equipment, slick sheet, bedside urinal, satin pajamas, etc  Supine large amplitude movements 1x10 Cues for sequence and ideal body position to improve bed mobility  LE stretching 2x60 sec All major muscle groups to improve flexibility and reduce rigidity            TODAY'S TREATMENT: 08/30/23 Activity Comments  Nustep L5 x 6 min UEs/LEs  Maintaining ~ 75SPM  gait training with 4WW with focus on turns around objects and safety with transfers ~125ft Cueing to stand taller, pull walker towards him, keep feet b/w walker wheels. Edu on freezing tips during turns   Gait training with 4WW on sidewalk, curbs, ramps 578ft Heavy cueing for improved awareness of obstacles/items to his L as patient ran into things on the L a few times.   supine PWR moves for bed mobility: Up 10x  Rock 10x  Twist 10x  step  10x  Cueing for proper positioning, hand and eye boost necessary as well as verbal and manual cues     PATIENT EDUCATION: Education details: wrote note for pt to bring to wife requesting to bring him to one of his PT appointments to allow [place] to practice car transfers in his own car Person educated: Patient Education method: Explanation and Handouts Education comprehension: verbalized understanding  HOME EXERCISE PROGRAM: Access Code: [email or web address] URL: [email or web address] Date: 08/07/2023 Prepared by: Shary Decamp  Exercises - Supine Lower Trunk Rotation  - 1 x daily - 7 x weekly - 1-3 sets - 10 reps - Sidelying Open Book Thoracic Lumbar Rotation and Extension  - 1 x daily - 7 x weekly - 3 sets - 10 reps - Prone on Elbows Reach  - 1 x daily - 7 x weekly - 1-3 sets - 10 reps - Seated Chop and Lift with Ball   - 1 x daily - 7 x weekly - 1-3 sets - 10 reps - Standing Diagonal Chops with Medicine Ball  - 1 x daily - 7 x weekly - 1-3 sets - 10 reps  + standing PWR FLOW video 2x/day near counter top      Note: Objective measures were completed at Evaluation unless otherwise noted.  DIAGNOSTIC FINDINGS: NA  COGNITION: Overall cognitive status: History of cognitive impairments - at baseline   COORDINATION: Slowed alternating taps  MUSCLE TONE: LLE: Mild  POSTURE: rounded shoulders, forward head, and posterior pelvic tilt  UE ROM:  approx 100 degrees shoulder flex bilaterally, elbows are flexed   WFL abduction bilaterally, elbows flexed   WFL internal and external rotation,slowed  LOWER EXTREMITY ROM:     Active  Right Eval Left Eval  Hip flexion    Hip extension    Hip abduction    Hip adduction    Hip internal rotation    Hip external rotation    Knee flexion    Knee extension -12 -10  Ankle dorsiflexion    Ankle plantarflexion    Ankle inversion    Ankle eversion     (Blank rows = not tested)  LOWER EXTREMITY MMT:    MMT Right Eval Left Eval  Hip flexion 5 4+  Hip extension    Hip abduction 5 5  Hip adduction 4 4  Hip internal rotation    Hip external rotation    Knee flexion 5 4+  Knee extension 5 5  Ankle dorsiflexion 4+ 4  Ankle plantarflexion    Ankle inversion    Ankle eversion    (Blank rows = not tested)  BED MOBILITY:  Reports difficulty and needs wife's assistance   TRANSFERS: Assistive device utilized: None  Sit to stand: SBA Stand to sit: SBA *Reports difficulty with sit to stand from low surfaces  GAIT: Gait pattern: step through pattern, decreased arm swing- Right, decreased arm swing- Left, decreased stride length, shuffling, trunk flexed, and narrow BOS Distance walked: 50 ft x 2 Assistive device utilized: None Level of assistance: SBA Comments: Wife reports shuffling gait at home.  FUNCTIONAL TESTS:  5 times sit to stand: 10.38 sec arms crossed at chest Timed up and go (TUG): 14.13 10 meter walk test: 9.19 sec  (3.57 ft/sec) 360 turn R:  30.78 sec, festination>30 steps To L:  17 steps, 7.59 sec 55M back:  16.82 sec  (0.6 ft/sec) TUG cognitive:  18.38 sec    GOALS: Goals reviewed with patient? Yes  SHORT TERM GOALS: Target date: 09/01/2023  Pt will be independent with HEP for improved balance, gait, posture. Baseline: Goal status: IN PROGRESS  2.  Pt will improve TUG/TUG cognitive score to less than or equal to 10% difference for decreased fall risk. Baseline: 14.13/18.38 Goal status: IN PROGRESS  3.  Pt/wife will report at least 50% improvement in bed mobility and transfers, for decreased caregiver burden. Baseline:  Goal status: IN PROGRESS   LONG TERM GOALS: Target date:  09/29/2023   Pt will be independent with HEP for improved balance, gait, posture, functional mobility. Baseline:  Goal status: IN PROGRESS  2.  Pt will verbalize understanding of local Parkinson's disease community resources, including optimal fitness upon d/c from PT. Baseline:  Goal status: IN PROGRESS  3.  Pt will perform 360 degree turns to R and L in <10-12 steps, for improved balance. Baseline:  Goal status: IN PROGRESS  4.  MiniBESTest score to improve by at least 5 points for decreased fall risk. Baseline: 21/28 Goal status: IN PROGRESS  5.  Backwards gait velocity in 55M walk to improve to 1 ft/sec for improved balance. Baseline: 0.6 ft/sec Goal status: IN PROGRESS  ASSESSMENT:  CLINICAL IMPRESSION: Initiated session with spouse for problem solving and practice of car transfers and bed mobility. Review of techniques and strategies to improve safety and reduce level of caregiver assistance and demo of relevant adaptive equipment with print-outs provided for reference.  Continued with session performing supine large amplitude movements to improve coordination and mobility.  Verbal and tactile cues to improve coordination and sequence with tapered feedback alloted for improved teach-back.  Continued sessions to progress POC details and improve mobility and reduce risk for falls.      OBJECTIVE  IMPAIRMENTS: Abnormal gait, decreased balance, decreased knowledge of use of DME, decreased mobility, difficulty walking, decreased ROM, decreased strength, impaired flexibility, impaired tone, and postural dysfunction.   ACTIVITY LIMITATIONS: transfers, bed mobility, dressing, reach over head, hygiene/grooming, and locomotion level  PARTICIPATION LIMITATIONS: community activity, yard work, and fitness, travel  PERSONAL FACTORS: 3+ comorbidities: see above  are also affecting patient's functional outcome.   REHAB POTENTIAL: Good  CLINICAL DECISION MAKING: Evolving/moderate complexity  EVALUATION COMPLEXITY: Moderate  PLAN:  PT FREQUENCY: 2x/week  PT DURATION: 8 weeks plus eval  PLANNED INTERVENTIONS: 97110-Therapeutic exercises, 97530- Therapeutic activity, [id number]- Neuromuscular re-education, 97535- Self Care, [id number]- Manual therapy, [phone]- Gait training, Patient/Family education, Balance training, Stair training, DME instructions, and Moist heat  PLAN FOR NEXT SESSION: Gait training with cane/rollator.  Initiate session with Nustep for interval aerobic training and review PWR! Moves-standing.  Try supine PWR! Moves (to help with bed mobility);  will need to include wife in education (ask about wife coming in for part of sessions); car transfers    10:24 AM, 09/04/23 M. Shary Decamp, PT, DPT Physical Therapist- [place] Office Number: [phone]

## 2023-09-06 ENCOUNTER — Ambulatory Visit: Payer: Medicare Other | Admitting: Physical Therapy

## 2023-09-06 ENCOUNTER — Encounter: Payer: Self-pay | Admitting: Physical Therapy

## 2023-09-06 DIAGNOSIS — R29818 Other symptoms and signs involving the nervous system: Secondary | ICD-10-CM | POA: Diagnosis not present

## 2023-09-06 DIAGNOSIS — R293 Abnormal posture: Secondary | ICD-10-CM | POA: Diagnosis not present

## 2023-09-06 DIAGNOSIS — R2689 Other abnormalities of gait and mobility: Secondary | ICD-10-CM | POA: Diagnosis not present

## 2023-09-06 DIAGNOSIS — R2681 Unsteadiness on feet: Secondary | ICD-10-CM | POA: Diagnosis not present

## 2023-09-06 NOTE — Therapy (Signed)
OUTPATIENT PHYSICAL THERAPY NEURO TREATMENT   Patient Name: Barry Pope MRN: [id number] DOB:05/14/1946, 77 y.o., male Today's Date: 09/06/2023   PCP: [name], MD REFERRING PROVIDER: Vladimir Faster, DO  END OF SESSION:  PT End of Session - 09/06/23 1450     Visit Number 9    Number of Visits 17    Date for PT Re-Evaluation 09/29/23    Authorization Type BCBS Medicare    Progress Note Due on Visit 10    PT Start Time 1450    PT Stop Time 1530    PT Time Calculation (min) 40 min    Equipment Utilized During Treatment Gait belt    Activity Tolerance Patient tolerated treatment well    Behavior During Therapy WFL for tasks assessed/performed                  Past Medical History:  Diagnosis Date   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   Allergic rhinitis    Dysphonia 08/27/2019   Gastroesophageal reflux disease 03/26/2019   History of malignant carcinoid tumor of large intestine    History of malignant carcinoid tumor of small intestine    Insomnia    Knee osteoarthritis    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   Male erectile dysfunction, unspecified    Parkinson's disease 12/09/2019   Rosacea, unspecified    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   Vocal fold atrophy    Past Surgical History:  Procedure Laterality Date   APPENDECTOMY     COLONOSCOPY  2004/2009/2015   INGUINAL HERNIA REPAIR Right    KNEE SURGERY     PARTIAL COLECTOMY     Patient Active Problem List   Diagnosis Date Noted   Mesenteric mass 07/08/2022   History of malignant carcinoid tumor 07/08/2022   Muscular deconditioning 01/30/2022   Acute metabolic encephalopathy 01/28/2022   History of malignant carcinoid tumor of small intestine 01/27/2022   Insomnia 01/27/2022   Mild cognitive impairment 01/27/2022   Abnormal CXR 01/27/2022   Abnormal CT scan 01/27/2022   Hyponatremia 01/27/2022   Transaminitis 01/27/2022   Parkinson's disease 12/09/2019   Allergies    Dysphonia 08/27/2019    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  Still having some trouble with the car transfers.  Pt accompanied by: self  PERTINENT HISTORY: PD, memory change (suspect mild/mod PDD), depression,RBD, RLS, and insomnia, low back pain  PAIN:  Are you having pain? Yes: NPRS scale: 0/10 Pain location: back Pain description: muscle weakness Aggravating factors: bending over, standing too long Relieving factors: sitting  PRECAUTIONS: Fall  RED FLAGS: None   WEIGHT BEARING RESTRICTIONS: No  FALLS: Has patient fallen in last 6 months? Yes. Number of falls 3 3 falls last week (at the beach)  LIVING ENVIRONMENT: Lives with: lives with their spouse Lives in: House/apartment Stairs: Yes: Internal: 16 steps; on right going up and External: 2-3 steps; on right going up Has following equipment at home: Single point cane  PLOF: Independent, Leisure: works out at [employer] several days/wk, and needing more assistance from wife for bed mobility and for dressing  PATIENT GOALS: Patient:  to help with balance and walking.  Wife:  to help with more movement in shoulders and help with better mobility.  OBJECTIVE:    TODAY'S TREATMENT: 09/06/2023  Activity Comments  NuStep, Level 4>5, 4 extremities x 8 minutes 30 sec intervals  increased pace; baseline>85 SPM  Sit to stand, 2 x 5 reps Addition of PWR! Up with stand upright  TUG:  14.94 sec TUG cognitive:  16.25 sec      PWR! Up FLOW:   Seated PWR! Up x 5 Seated PWR! Up>Sit to stand x 5 Seated PWR! Up x 5>sit to stand>Standing PWR! Up x 5 Visual and verbal cues; pt does get off sequence  Forward/back walking 20 ft, no device, min guard 3 reps, narrowed BOS and shorter steps posterior direction  Review of lumbar exercises: Supine trunk rotation  Sidelying open book stretch Prone on elbow-reach Tactile cues for weightshift through hips        PATIENT EDUCATION: Education details: progress towards goals, POC and requested adding additional appts through end of POC.  Pt asks what is best for him to do right now:  PT educates pt to do HEP daily (he reports he hasn't been doing as much in the past 1-2 weeks) Person educated: Patient Education method: Explanation and Handouts Education comprehension: verbalized understanding  HOME EXERCISE PROGRAM: Access Code: [email or web address] URL: [email or web address] Date: 08/07/2023 Prepared by: Shary Decamp  Exercises - Supine Lower Trunk Rotation  - 1 x daily - 7 x weekly - 1-3 sets - 10 reps - Sidelying Open Book Thoracic Lumbar Rotation and Extension  - 1 x daily - 7 x weekly - 3 sets - 10 reps - Prone on Elbows Reach  - 1 x daily - 7 x weekly - 1-3 sets - 10 reps - Seated Chop and Lift with Ball   - 1 x daily - 7 x weekly - 1-3 sets - 10 reps - Standing Diagonal Chops with Medicine Ball  - 1 x daily - 7 x weekly - 1-3 sets - 10 reps  + standing PWR FLOW video 2x/day near counter top     Note: Objective measures were completed at Evaluation unless otherwise noted.  DIAGNOSTIC FINDINGS: NA  COGNITION: Overall cognitive status: History of cognitive impairments - at baseline   COORDINATION: Slowed alternating taps  MUSCLE TONE: LLE: Mild  POSTURE: rounded shoulders, forward head, and posterior pelvic  tilt  UE ROM:  approx 100 degrees shoulder flex bilaterally, elbows are flexed   WFL abduction bilaterally, elbows flexed  WFL internal and external rotation,slowed  LOWER EXTREMITY ROM:     Active  Right Eval Left Eval  Hip flexion    Hip extension    Hip abduction    Hip adduction    Hip internal rotation    Hip external rotation    Knee flexion    Knee extension -12 -10  Ankle dorsiflexion    Ankle plantarflexion    Ankle inversion    Ankle eversion     (Blank rows = not tested)  LOWER EXTREMITY MMT:    MMT Right Eval Left Eval  Hip flexion 5 4+  Hip extension    Hip abduction 5 5  Hip adduction 4 4  Hip internal rotation    Hip external rotation    Knee flexion 5 4+  Knee extension 5 5  Ankle dorsiflexion 4+ 4  Ankle plantarflexion    Ankle inversion    Ankle eversion    (Blank rows = not tested)  BED MOBILITY:  Reports difficulty and needs wife's assistance   TRANSFERS: Assistive device utilized: None  Sit to stand: SBA Stand to sit: SBA *Reports difficulty with sit to stand from low surfaces  GAIT: Gait pattern: step through pattern, decreased arm swing- Right, decreased arm swing- Left, decreased stride length, shuffling, trunk flexed, and narrow BOS Distance walked: 50 ft x 2 Assistive device utilized: None Level of assistance: SBA Comments: Wife reports shuffling gait at home.  FUNCTIONAL TESTS:  5 times sit to stand: 10.38 sec arms crossed at chest Timed up and go (TUG): 14.13 10 meter walk test: 9.19 sec  (3.57 ft/sec) 360 turn R:  30.78 sec, festination>30 steps To L:  17 steps, 7.59 sec 439M back:  16.82 sec (0.6 ft/sec) TUG cognitive:  18.38 sec    GOALS: Goals reviewed with patient? Yes  SHORT TERM GOALS: Target date: 09/01/2023  Pt will be independent with HEP for improved balance, gait, posture. Baseline: needs cues Goal status: PARTIALLY MET 09/06/2023  2.  Pt will improve TUG/TUG cognitive score to less than or equal to  10% difference for decreased fall risk. Baseline: 14.13/18.38; 14.94, 16.25 sec 09/06/2023 Goal status: MET 09/06/2023  3.  Pt/wife will report at least 50% improvement in bed mobility and transfers, for decreased caregiver burden. Baseline: rates "a little" improvement 09/05/2023 Goal status: IN PROGRESS   LONG TERM GOALS: Target date:  09/29/2023   Pt will be independent with HEP for improved balance, gait, posture, functional mobility. Baseline:  Goal status: IN PROGRESS  2.  Pt will verbalize understanding of local Parkinson's disease community resources, including optimal fitness upon d/c from PT. Baseline:  Goal status: IN PROGRESS  3.  Pt will perform 360 degree turns to R and L in <10-12 steps, for improved balance. Baseline:  Goal status: IN PROGRESS  4.  MiniBESTest score to improve by at least 5 points for decreased fall risk. Baseline: 21/28 Goal status: IN PROGRESS  5.  Backwards gait velocity in 439M walk to improve to 1 ft/sec for improved balance. Baseline: 0.6 ft/sec Goal status: IN PROGRESS  ASSESSMENT:  CLINICAL IMPRESSION: Looked at STGs; pt has met STG 2 for improved TUG/TUG cognitive measure.  He does have difficulty with progressive difficulty with PWR! Moves FLOW to work on dual tasking with posture exercise.  Reviewed HEP and pt has return demo with cues; he does report he hasn't been regular with performing HEP in past 1-2 weeks.  He will responds well to cues for larger  amplitude movement patterns, and he does trail off with smaller movement patterns with repetition and with challenge.  He will continue to benefit from skilled PT towards goals for improved functional mobility and decreased fall risk.    OBJECTIVE IMPAIRMENTS: Abnormal gait, decreased balance, decreased knowledge of use of DME, decreased mobility, difficulty walking, decreased ROM, decreased strength, impaired flexibility, impaired tone, and postural dysfunction.   ACTIVITY LIMITATIONS:  transfers, bed mobility, dressing, reach over head, hygiene/grooming, and locomotion level  PARTICIPATION LIMITATIONS: community activity, yard work, and fitness, travel  PERSONAL FACTORS: 3+ comorbidities: see above  are also affecting patient's functional outcome.   REHAB POTENTIAL: Good  CLINICAL DECISION MAKING: Evolving/moderate complexity  EVALUATION COMPLEXITY: Moderate  PLAN:  PT FREQUENCY: 2x/week  PT DURATION: 8 weeks plus eval  PLANNED INTERVENTIONS: 97110-Therapeutic exercises, 97530- Therapeutic activity, 97112- Neuromuscular re-education, 97535- Self Care, 91478- Manual therapy, [phone]- Gait training, Patient/Family education, Balance training, Stair training, DME instructions, and Moist heat  PLAN FOR NEXT SESSION: 10 th Visit PN; Gait training with cane/rollator.  Initiate session with Nustep for interval aerobic training and review PWR! Moves-standing.  Continue to work on gait, bed mobility, car transfer sequences.    Lonia Blood, PT 09/06/23 4:25 PM Phone: [phone] Fax: [phone]   [facility] [address] [place], [address] [place], [place] [address] Phone # [phone] Fax # [phone]

## 2023-09-18 ENCOUNTER — Other Ambulatory Visit (HOSPITAL_BASED_OUTPATIENT_CLINIC_OR_DEPARTMENT_OTHER): Payer: Self-pay

## 2023-09-18 ENCOUNTER — Ambulatory Visit: Payer: Medicare Other | Attending: Neurology

## 2023-09-18 DIAGNOSIS — R2689 Other abnormalities of gait and mobility: Secondary | ICD-10-CM | POA: Insufficient documentation

## 2023-09-18 DIAGNOSIS — R293 Abnormal posture: Secondary | ICD-10-CM | POA: Diagnosis not present

## 2023-09-18 DIAGNOSIS — R29818 Other symptoms and signs involving the nervous system: Secondary | ICD-10-CM | POA: Diagnosis not present

## 2023-09-18 DIAGNOSIS — R2681 Unsteadiness on feet: Secondary | ICD-10-CM | POA: Insufficient documentation

## 2023-09-18 NOTE — Therapy (Signed)
OUTPATIENT PHYSICAL THERAPY NEURO TREATMENT and Progress Note   Patient Name: Barry Pope MRN: [id number] DOB:05-07-1946, 77 y.o., male Today's Date: 09/18/2023   PCP: [name], MD REFERRING PROVIDER: Vladimir Faster, DO  Progress Note Reporting Period 08/02/23 to 09/18/23  See note below for Objective Data and Assessment of Progress/Goals.      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   Allergic rhinitis    Dysphonia 08/27/2019   Gastroesophageal reflux disease 03/26/2019   History of malignant carcinoid tumor of large intestine    History of malignant carcinoid tumor of small intestine    Insomnia    Knee osteoarthritis    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   Male erectile dysfunction, unspecified    Parkinson's disease 12/09/2019   Rosacea, unspecified    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   Vocal fold atrophy    Past Surgical History:  Procedure Laterality Date   APPENDECTOMY     COLONOSCOPY  2004/2009/2015   INGUINAL HERNIA REPAIR Right    KNEE SURGERY     PARTIAL COLECTOMY     Patient Active Problem List   Diagnosis Date Noted   Mesenteric mass 07/08/2022   History of malignant carcinoid tumor 07/08/2022   Muscular deconditioning 01/30/2022   Acute metabolic encephalopathy 01/28/2022   History of malignant carcinoid tumor of small intestine 01/27/2022   Insomnia 01/27/2022   Mild cognitive impairment 01/27/2022   Abnormal CXR 01/27/2022   Abnormal  CT scan 01/27/2022   Hyponatremia 01/27/2022   Transaminitis 01/27/2022   Parkinson's disease 12/09/2019   Allergies    Dysphonia 08/27/2019   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   Gastroesophageal reflux disease 03/26/2019   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    ONSET DATE: 06/27/2023 (MD referral)  REFERRING DIAG: G20.B2 (ICD-10-CM) - Parkinson's disease with dyskinesia and fluctuating manifestations (HCC)   THERAPY DIAG:  Other abnormalities of gait and mobility  Abnormal posture  Unsteadiness on feet  Other symptoms and signs involving the nervous system  Rationale for Evaluation and Treatment: Rehabilitation  SUBJECTIVE:  SUBJECTIVE STATEMENT: Will be getting a rollator, would like to practice additionally  Pt accompanied by: self  PERTINENT HISTORY: PD, memory change (suspect mild/mod PDD), depression,RBD, RLS, and insomnia, low back pain  PAIN:  Are you having pain? Yes: NPRS scale: 0/10 Pain location: back Pain description: muscle weakness Aggravating factors: bending over, standing too long Relieving factors: sitting  PRECAUTIONS: Fall  RED FLAGS: None   WEIGHT BEARING RESTRICTIONS: No  FALLS: Has patient fallen in last 6 months? Yes. Number of falls 3 3 falls last week (at the beach)  LIVING ENVIRONMENT: Lives with: lives with their spouse Lives in: House/apartment Stairs: Yes: Internal: 16 steps; on right going up and External: 2-3 steps; on right going up Has following equipment at home: Single point cane  PLOF: Independent, Leisure: works out at [employer] several days/wk, and needing more assistance from wife for bed mobility and for dressing  PATIENT GOALS: Patient:  to help with balance and walking.  Wife:  to help with more  movement in shoulders and help with better mobility.  OBJECTIVE:   TODAY'S TREATMENT: 09/18/23 Activity Comments  Mini BESTest 23/28  Gait speed 25m walk test 12.65 sec = 2.59 ft/sec  180 deg turns 5-6 steps 360 deg turn 10-11 steps   Gait training Trial with rollator throughout clinic emphasis on negotiating obstacles and turns. Demo modified indep and greater stride length through turns with device  Wall wash 10x For postural re-ed/stretch to T-spine         TODAY'S TREATMENT: 09/06/2023  Activity Comments  NuStep, Level 4>5, 4 extremities x 8 minutes 30 sec intervals increased pace; baseline>85 SPM  Sit to stand, 2 x 5 reps Addition of PWR! Up with stand upright  TUG:  14.94 sec TUG cognitive:  16.25 sec      PWR! Up FLOW:   Seated PWR! Up x 5 Seated PWR! Up>Sit to stand x 5 Seated PWR! Up x 5>sit to stand>Standing PWR! Up x 5 Visual and verbal cues; pt does get off sequence  Forward/back walking 20 ft, no device, min guard 3 reps, narrowed BOS and shorter steps posterior direction  Review of lumbar exercises: Supine trunk rotation  Sidelying open book stretch Prone on elbow-reach Tactile cues for weightshift through hips        PATIENT EDUCATION: Education details: progress towards goals, POC and requested adding additional appts through end of POC.  Pt asks what is best for him to do right now:  PT educates pt to do HEP daily (he reports he hasn't been doing as much in the past 1-2 weeks) Person educated: Patient Education method: Explanation and Handouts Education comprehension: verbalized understanding  HOME EXERCISE PROGRAM: Access Code: [email or web address] URL: [email or web address] Date: 08/07/2023 Prepared by: Shary Decamp  Exercises - Supine Lower Trunk Rotation  - 1 x daily - 7 x weekly - 1-3 sets - 10 reps - Sidelying Open Book Thoracic Lumbar Rotation and Extension  - 1 x daily - 7 x weekly - 3 sets - 10 reps - Prone on Elbows Reach  - 1 x daily -  7 x weekly - 1-3 sets - 10 reps - Seated Chop and Lift with Ball   - 1 x daily - 7 x weekly - 1-3 sets - 10 reps - Standing Diagonal Chops with Medicine Ball  - 1 x daily - 7 x weekly - 1-3 sets - 10 reps  + standing PWR FLOW video 2x/day near counter top     Note: Objective  measures were completed at Evaluation unless otherwise noted.  DIAGNOSTIC FINDINGS: NA  COGNITION: Overall cognitive status: History of cognitive impairments - at baseline   COORDINATION: Slowed alternating taps  MUSCLE TONE: LLE: Mild  POSTURE: rounded shoulders, forward head, and posterior pelvic tilt  UE ROM:  approx 100 degrees shoulder flex bilaterally, elbows are flexed   WFL abduction bilaterally, elbows flexed   WFL internal and external rotation,slowed  LOWER EXTREMITY ROM:     Active  Right Eval Left Eval  Hip flexion    Hip extension    Hip abduction    Hip adduction    Hip internal rotation    Hip external rotation    Knee flexion    Knee extension -12 -10  Ankle dorsiflexion    Ankle plantarflexion    Ankle inversion    Ankle eversion     (Blank rows = not tested)  LOWER EXTREMITY MMT:    MMT Right Eval Left Eval  Hip flexion 5 4+  Hip extension    Hip abduction 5 5  Hip adduction 4 4  Hip internal rotation    Hip external rotation    Knee flexion 5 4+  Knee extension 5 5  Ankle dorsiflexion 4+ 4  Ankle plantarflexion    Ankle inversion    Ankle eversion    (Blank rows = not tested)  BED MOBILITY:  Reports difficulty and needs wife's assistance   TRANSFERS: Assistive device utilized: None  Sit to stand: SBA Stand to sit: SBA *Reports difficulty with sit to stand from low surfaces  GAIT: Gait pattern: step through pattern, decreased arm swing- Right, decreased arm swing- Left, decreased stride length, shuffling, trunk flexed, and narrow BOS Distance walked: 50 ft x 2 Assistive device utilized: None Level of assistance: SBA Comments: Wife reports shuffling  gait at home.  FUNCTIONAL TESTS:  5 times sit to stand: 10.38 sec arms crossed at chest Timed up and go (TUG): 14.13 10 meter walk test: 9.19 sec  (3.57 ft/sec) 360 turn R:  30.78 sec, festination>30 steps To L:  17 steps, 7.59 sec 75M back:  16.82 sec (0.6 ft/sec) TUG cognitive:  18.38 sec    GOALS: Goals reviewed with patient? Yes  SHORT TERM GOALS: Target date: 09/01/2023  Pt will be independent with HEP for improved balance, gait, posture. Baseline: needs cues Goal status: PARTIALLY MET 09/06/2023  2.  Pt will improve TUG/TUG cognitive score to less than or equal to 10% difference for decreased fall risk. Baseline: 14.13/18.38; 14.94, 16.25 sec 09/06/2023 Goal status: MET 09/06/2023  3.  Pt/wife will report at least 50% improvement in bed mobility and transfers, for decreased caregiver burden. Baseline: rates "a little" improvement 09/05/2023 Goal status: IN PROGRESS   LONG TERM GOALS: Target date:  09/29/2023   Pt will be independent with HEP for improved balance, gait, posture, functional mobility. Baseline:  Goal status: IN PROGRESS  2.  Pt will verbalize understanding of local Parkinson's disease community resources, including optimal fitness upon d/c from PT. Baseline:  Goal status: IN PROGRESS  3.  Pt will perform 360 degree turns to R and L in <10-12 steps, for improved balance. Baseline: 10-12 steps Goal status: MET  4.  MiniBESTest score to improve by at least 5 points for decreased fall risk. Baseline: 21/28; (09/18/23) 23/28 Goal status: IN PROGRESS  5.  Backwards gait velocity in 75M walk to improve to 1 ft/sec for improved balance. Baseline: 0.6 ft/sec; (09/18/23) 1.2 ft/sec Goal status: MET  ASSESSMENT:  CLINICAL IMPRESSION: 10th visit progress note completed and performance of Mini-BESTest with improved score from initial 21/28 to 23/28 and educated on relevant info as this pertains to reduce risk for falls.  Review of mobility and motor control  activities with improved performance noted 180-360 degree turns requiring fewer steps and able to demo improved 3 meter backwards walk from initial 0.6 ft/sec to 1.2 ft/sec.  Demonstrates improved safety and independence with ambulation using rollator and negotiating obstacles and turns with good stride length and safety.  Pt would benefit from continued sessions to progress POC details and improve balance, motor control, and reduced risk for falls    OBJECTIVE IMPAIRMENTS: Abnormal gait, decreased balance, decreased knowledge of use of DME, decreased mobility, difficulty walking, decreased ROM, decreased strength, impaired flexibility, impaired tone, and postural dysfunction.   ACTIVITY LIMITATIONS: transfers, bed mobility, dressing, reach over head, hygiene/grooming, and locomotion level  PARTICIPATION LIMITATIONS: community activity, yard work, and fitness, travel  PERSONAL FACTORS: 3+ comorbidities: see above  are also affecting patient's functional outcome.   REHAB POTENTIAL: Good  CLINICAL DECISION MAKING: Evolving/moderate complexity  EVALUATION COMPLEXITY: Moderate  PLAN:  PT FREQUENCY: 2x/week  PT DURATION: 8 weeks plus eval  PLANNED INTERVENTIONS: 97110-Therapeutic exercises, 97530- Therapeutic activity, [id number]- Neuromuscular re-education, 97535- Self Care, 40981- Manual therapy, [phone]- Gait training, Patient/Family education, Balance training, Stair training, DME instructions, and Moist heat  PLAN FOR NEXT SESSION:  Gait training with cane/rollator.  Initiate session with Nustep for interval aerobic training and review PWR! Moves-standing.  Continue to work on gait, bed mobility, car transfer sequences. Expressed interest in continuing PT sessions for recertification    10:47 AM, 09/18/23 M. Shary Decamp, PT, DPT Physical Therapist- [place] Office Number: [phone]

## 2023-09-19 NOTE — Therapy (Signed)
OUTPATIENT PHYSICAL THERAPY NEURO TREATMENT   Patient Name: Barry Pope MRN: [id number] DOB:04-24-1946, 77 y.o., male Today's Date: 09/20/2023   PCP: [name], MD REFERRING PROVIDER: Vladimir Faster, DO       END OF SESSION:  PT End of Session - 09/20/23 1531     Visit Number 11    Number of Visits 17    Date for PT Re-Evaluation 09/29/23    Authorization Type BCBS Medicare    Progress Note Due on Visit 20    PT Start Time 1450    PT Stop Time 1530    PT Time Calculation (min) 40 min    Equipment Utilized During Treatment Gait belt    Activity Tolerance Patient tolerated treatment well    Behavior During Therapy WFL for tasks assessed/performed                   Past Medical History:  Diagnosis Date   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   Allergic rhinitis    Dysphonia 08/27/2019   Gastroesophageal reflux disease 03/26/2019   History of malignant carcinoid tumor of large intestine    History of malignant carcinoid tumor of small intestine    Insomnia    Knee osteoarthritis    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   Male erectile dysfunction, unspecified    Parkinson's disease 12/09/2019   Rosacea, unspecified    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   Vocal fold atrophy    Past Surgical History:  Procedure Laterality Date   APPENDECTOMY     COLONOSCOPY  2004/2009/2015   INGUINAL HERNIA REPAIR Right    KNEE SURGERY     PARTIAL COLECTOMY     Patient Active Problem List   Diagnosis Date Noted   Mesenteric mass 07/08/2022   History of malignant carcinoid tumor 07/08/2022   Muscular deconditioning 01/30/2022   Acute metabolic encephalopathy 01/28/2022   History of malignant carcinoid tumor of small intestine 01/27/2022   Insomnia 01/27/2022   Mild cognitive impairment 01/27/2022   Abnormal CXR 01/27/2022   Abnormal CT scan 01/27/2022   Hyponatremia 01/27/2022   Transaminitis 01/27/2022   Parkinson's disease 12/09/2019   Allergies    Dysphonia  08/27/2019   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   Pt accompanied by: self  PERTINENT HISTORY: PD, memory change (suspect mild/mod PDD), depression,RBD, RLS, and insomnia, low back pain  PAIN:  Are you having pain? Yes: NPRS scale: 0/10 Pain location: back Pain description: muscle weakness Aggravating factors: bending over, standing too long Relieving factors: sitting  PRECAUTIONS: Fall  RED FLAGS: None   WEIGHT BEARING RESTRICTIONS: No  FALLS: Has patient fallen in last 6 months? Yes. Number of falls 3 3 falls last week (at the beach)  LIVING ENVIRONMENT: Lives with: lives with their spouse Lives in: House/apartment Stairs: Yes: Internal: 16 steps; on right going up and External: 2-3 steps; on right going up Has following equipment at home: Single point cane  PLOF: Independent, Leisure: works out at [employer] several days/wk, and needing more assistance from wife for bed mobility and for dressing  PATIENT GOALS: Patient:  to help with balance and walking.  Wife:  to help with more movement in shoulders and help with better mobility.  OBJECTIVE:     TODAY'S TREATMENT: 09/20/23 Activity Comments  Nustep HIIT UEs/LEs   2.5 min warm up L2 30 sec L6 30 sec L5 1 min L2 1 min L6 30 sec L4 1 min L2 30 sec fast L6 30 sec L4 2 min cooldown L1 Total of 10 min Tolerated well   98% spO2, 83 bpm upon initiation of cooldown   review of standing PWR FLOW In II bars. Minor cueing with PWR rock and step, requiring larger amplitude. Good ability to link movements towards    standing step and reach to targets on therapy poles Good large steps and balance   gait training with 4WW with focus on turns around objects and safety with transfers Cues to maintain bent elbows, feet within walker wheels, avoiding running into objects      HOME EXERCISE PROGRAM: Access Code: [email or web address] URL: [email or web address] Date: 08/07/2023 Prepared by: Shary Decamp  Exercises - Supine Lower Trunk Rotation  - 1 x daily - 7 x weekly - 1-3 sets - 10 reps - Sidelying Open Book Thoracic Lumbar Rotation and Extension  - 1 x daily - 7 x weekly - 3 sets - 10 reps - Prone on Elbows Reach  - 1 x daily - 7 x weekly - 1-3 sets - 10 reps - Seated Chop and Lift with Ball   - 1 x daily - 7 x weekly - 1-3 sets - 10 reps - Standing Diagonal Chops with Medicine Ball  - 1 x daily - 7 x weekly - 1-3 sets - 10 reps  + standing PWR FLOW video 2x/day near counter top     Note: Objective measures were completed at Evaluation unless otherwise noted.  DIAGNOSTIC FINDINGS: NA  COGNITION: Overall cognitive status: History of cognitive impairments - at baseline   COORDINATION: Slowed alternating taps  MUSCLE TONE: LLE: Mild  POSTURE: rounded shoulders, forward head, and posterior pelvic tilt  UE ROM:  approx 100 degrees shoulder flex bilaterally, elbows are flexed   WFL abduction bilaterally, elbows flexed   WFL internal and external rotation,slowed  LOWER EXTREMITY ROM:     Active  Right Eval Left Eval  Hip flexion    Hip extension    Hip abduction    Hip adduction    Hip internal rotation    Hip external rotation     Knee flexion    Knee extension -12 -10  Ankle dorsiflexion  Ankle plantarflexion    Ankle inversion    Ankle eversion     (Blank rows = not tested)  LOWER EXTREMITY MMT:    MMT Right Eval Left Eval  Hip flexion 5 4+  Hip extension    Hip abduction 5 5  Hip adduction 4 4  Hip internal rotation    Hip external rotation    Knee flexion 5 4+  Knee extension 5 5  Ankle dorsiflexion 4+ 4  Ankle plantarflexion    Ankle inversion    Ankle eversion    (Blank rows = not tested)  BED MOBILITY:  Reports difficulty and needs wife's assistance   TRANSFERS: Assistive device utilized: None  Sit to stand: SBA Stand to sit: SBA *Reports difficulty with sit to stand from low surfaces  GAIT: Gait pattern: step through pattern, decreased arm swing- Right, decreased arm swing- Left, decreased stride length, shuffling, trunk flexed, and narrow BOS Distance walked: 50 ft x 2 Assistive device utilized: None Level of assistance: SBA Comments: Wife reports shuffling gait at home.  FUNCTIONAL TESTS:  5 times sit to stand: 10.38 sec arms crossed at chest Timed up and go (TUG): 14.13 10 meter walk test: 9.19 sec  (3.57 ft/sec) 360 turn R:  30.78 sec, festination>30 steps To L:  17 steps, 7.59 sec 8M back:  16.82 sec (0.6 ft/sec) TUG cognitive:  18.38 sec    GOALS: Goals reviewed with patient? Yes  SHORT TERM GOALS: Target date: 09/01/2023  Pt will be independent with HEP for improved balance, gait, posture. Baseline: needs cues Goal status: PARTIALLY MET 09/06/2023  2.  Pt will improve TUG/TUG cognitive score to less than or equal to 10% difference for decreased fall risk. Baseline: 14.13/18.38; 14.94, 16.25 sec 09/06/2023 Goal status: MET 09/06/2023  3.  Pt/wife will report at least 50% improvement in bed mobility and transfers, for decreased caregiver burden. Baseline: rates "a little" improvement 09/05/2023 Goal status: IN PROGRESS   LONG TERM GOALS: Target date:   09/29/2023   Pt will be independent with HEP for improved balance, gait, posture, functional mobility. Baseline:  Goal status: IN PROGRESS  2.  Pt will verbalize understanding of local Parkinson's disease community resources, including optimal fitness upon d/c from PT. Baseline:  Goal status: IN PROGRESS  3.  Pt will perform 360 degree turns to R and L in <10-12 steps, for improved balance. Baseline: 10-12 steps Goal status: MET  4.  MiniBESTest score to improve by at least 5 points for decreased fall risk. Baseline: 21/28; (09/18/23) 23/28 Goal status: IN PROGRESS  5.  Backwards gait velocity in 8M walk to improve to 1 ft/sec for improved balance. Baseline: 0.6 ft/sec; (09/18/23) 1.2 ft/sec Goal status: MET  ASSESSMENT:  CLINICAL IMPRESSION: Patient arrived to session without new complaints. HIIT on Nustep was used for neural priming effect before PWR moves. Reviewed standing PWR moves sequences today. Patient required minor cues but admits to noncompliance with these activities, thus encouraged participation at home. Multidirectional reaching and stepping was performed with good stability. Reviewed gait training with [phone] which required cues for safety, posture, and navigation. Will need increased practice with this AD. Patient tolerated session well and without complaints upon leaving.  OBJECTIVE IMPAIRMENTS: Abnormal gait, decreased balance, decreased knowledge of use of DME, decreased mobility, difficulty walking, decreased ROM, decreased strength, impaired flexibility, impaired tone, and postural dysfunction.   ACTIVITY LIMITATIONS: transfers, bed mobility, dressing, reach over head, hygiene/grooming, and locomotion level  PARTICIPATION LIMITATIONS: community activity, yard work,  and fitness, travel  PERSONAL FACTORS: 3+ comorbidities: see above  are also affecting patient's functional outcome.   REHAB POTENTIAL: Good  CLINICAL DECISION MAKING: Evolving/moderate  complexity  EVALUATION COMPLEXITY: Moderate  PLAN:  PT FREQUENCY: 2x/week  PT DURATION: 8 weeks plus eval  PLANNED INTERVENTIONS: 97110-Therapeutic exercises, 97530- Therapeutic activity, [id number]- Neuromuscular re-education, 97535- Self Care, 24401- Manual therapy, [phone]- Gait training, Patient/Family education, Balance training, Stair training, DME instructions, and Moist heat  PLAN FOR NEXT SESSION:  Gait training with cane/rollator.  Initiate session with Nustep for interval aerobic training and review PWR! Moves-standing.  Continue to work on gait, bed mobility, car transfer sequences. Expressed interest in continuing PT sessions for recertification   Baldemar Friday, PT, DPT 09/20/23 3:32 PM  [facility] [address] [place], [address] [place], [place] [address] Phone # [phone] Fax # [phone]

## 2023-09-20 ENCOUNTER — Ambulatory Visit: Payer: Medicare Other | Admitting: Physical Therapy

## 2023-09-20 ENCOUNTER — Encounter: Payer: Self-pay | Admitting: Physical Therapy

## 2023-09-20 DIAGNOSIS — R2689 Other abnormalities of gait and mobility: Secondary | ICD-10-CM

## 2023-09-20 DIAGNOSIS — R293 Abnormal posture: Secondary | ICD-10-CM

## 2023-09-20 DIAGNOSIS — R29818 Other symptoms and signs involving the nervous system: Secondary | ICD-10-CM

## 2023-09-20 DIAGNOSIS — R2681 Unsteadiness on feet: Secondary | ICD-10-CM

## 2023-09-25 ENCOUNTER — Ambulatory Visit: Payer: Medicare Other

## 2023-09-25 DIAGNOSIS — R293 Abnormal posture: Secondary | ICD-10-CM | POA: Diagnosis not present

## 2023-09-25 DIAGNOSIS — R2689 Other abnormalities of gait and mobility: Secondary | ICD-10-CM

## 2023-09-25 DIAGNOSIS — R29818 Other symptoms and signs involving the nervous system: Secondary | ICD-10-CM | POA: Diagnosis not present

## 2023-09-25 DIAGNOSIS — R2681 Unsteadiness on feet: Secondary | ICD-10-CM | POA: Diagnosis not present

## 2023-09-25 NOTE — Therapy (Signed)
OUTPATIENT PHYSICAL THERAPY NEURO TREATMENT   Patient Name: Barry Pope MRN: [id number] DOB:[birth year], 77 y.o., male Today's Date: 09/25/2023   PCP: [name], MD REFERRING PROVIDER: Vladimir Faster, DO   END OF SESSION:  PT End of Session - 09/25/23 1447     Visit Number 12    Number of Visits 17    Date for PT Re-Evaluation 09/29/23    Authorization Type [employer] Medicare    Progress Note Due on Visit 20    PT Start Time 1445    PT Stop Time 1530    PT Time Calculation (min) 45 min    Equipment Utilized During Treatment Gait belt    Activity Tolerance Patient tolerated treatment well    Behavior During Therapy WFL for tasks assessed/performed                   Past Medical History:  Diagnosis Date   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   Allergic rhinitis    Dysphonia 08/27/2019   Gastroesophageal reflux disease 03/26/2019   History of malignant carcinoid tumor of large intestine    History of malignant carcinoid tumor of small intestine    Insomnia    Knee osteoarthritis    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   Male erectile dysfunction, unspecified    Parkinson's disease 12/09/2019   Rosacea, unspecified    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   Vocal fold atrophy    Past Surgical History:  Procedure Laterality Date   APPENDECTOMY     COLONOSCOPY  2004/2009/2015   INGUINAL HERNIA REPAIR Right    KNEE SURGERY     PARTIAL COLECTOMY     Patient Active Problem List   Diagnosis Date Noted   Mesenteric mass 07/08/2022   History of malignant carcinoid tumor 07/08/2022   Muscular deconditioning 01/30/2022   Acute metabolic encephalopathy 01/28/2022   History of malignant carcinoid tumor of small intestine 01/27/2022   Insomnia 01/27/2022   Mild cognitive impairment 01/27/2022   Abnormal CXR 01/27/2022   Abnormal CT scan 01/27/2022   Hyponatremia 01/27/2022   Transaminitis 01/27/2022   Parkinson's disease 12/09/2019   Allergies    Dysphonia 08/27/2019    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 Pt reports physically he is feeling fine  Pt accompanied by: self  PERTINENT HISTORY: PD, memory change (suspect mild/mod PDD), depression,RBD, RLS, and insomnia, low back pain  PAIN:  Are you having pain? Yes: NPRS scale: 0/10 Pain location: back Pain description: muscle weakness Aggravating factors: bending over, standing too long Relieving factors: sitting  PRECAUTIONS: Fall  RED FLAGS: None   WEIGHT BEARING RESTRICTIONS: No  FALLS: Has patient fallen in last 6 months? Yes. Number of falls 3 3 falls last week (at the beach)  LIVING ENVIRONMENT: Lives with: lives with their spouse Lives in: House/apartment Stairs: Yes: Internal: 16 steps; on right going up and External: 2-3 steps; on right going up Has following equipment at home: Single point cane  PLOF: Independent, Leisure: works out at [employer] several days/wk, and needing more assistance from wife for bed mobility and for dressing  PATIENT GOALS: Patient:  to help with balance and walking.  Wife:  to help with more movement in shoulders and help with better mobility.  OBJECTIVE:   TODAY'S TREATMENT: 09/25/23 Activity Comments  NU-step speed intervals x 8  min. Resistance level 5. HR 81 bpm at end of bout 2 min warm-up 30 sec 120+ SPM; 30 sec slow/sustained  4-square step -3x clockwise/counter -3x with obstacles -3x calling colors and stepping to -3x w/ 10# suitcase carry  Sit to stand 1x5 -10# goblet hold -10# goblet alt toe tap on bosu -10# goblet lateral toe tap -step over bolster forward/backward  Rocker board x 60 sec -ant/post -lateral  Gastroc stretch 2x60 sec On slantboard  Standing on sloped surface -toes "uphill", eyes closed 3x30 sec  Retrowalking w/ dual-tasking        TODAY'S TREATMENT: 09/20/23 Activity Comments  Nustep HIIT UEs/LEs  2.5 min warm up L2 30 sec L6 30 sec L5 1 min L2 1 min L6 30 sec L4 1 min L2 30 sec fast L6 30 sec L4 2 min cooldown L1 Total of 10 min Tolerated well   98% spO2, 83 bpm upon initiation of cooldown   review of standing PWR FLOW In II bars. Minor cueing with PWR rock and step, requiring larger amplitude. Good ability to link movements towards    standing step and reach to targets on therapy poles Good large steps and balance   gait training with 4WW with focus on turns around objects and safety with transfers Cues to maintain bent elbows, feet within walker wheels, avoiding running into objects      HOME EXERCISE PROGRAM: Access Code: [email or web address] URL: [email or web address] Date: 08/07/2023 Prepared by: Shary Decamp  Exercises - Supine Lower Trunk Rotation  - 1 x daily - 7 x weekly - 1-3 sets - 10 reps - Sidelying Open Book Thoracic Lumbar Rotation and Extension  - 1 x daily - 7 x weekly - 3 sets - 10 reps - Prone on Elbows Reach  - 1 x daily - 7 x weekly - 1-3 sets - 10 reps - Seated Chop and Lift with Ball   - 1 x daily - 7 x weekly - 1-3 sets - 10 reps - Standing Diagonal Chops with Medicine Ball  - 1 x daily - 7 x weekly - 1-3 sets - 10 reps  + standing PWR FLOW video 2x/day near counter top     Note: Objective measures were completed at Evaluation unless  otherwise noted.  DIAGNOSTIC FINDINGS: NA  COGNITION: Overall cognitive status: History of cognitive impairments -  at baseline   COORDINATION: Slowed alternating taps  MUSCLE TONE: LLE: Mild  POSTURE: rounded shoulders, forward head, and posterior pelvic tilt  UE ROM:  approx 100 degrees shoulder flex bilaterally, elbows are flexed   WFL abduction bilaterally, elbows flexed   WFL internal and external rotation,slowed  LOWER EXTREMITY ROM:     Active  Right Eval Left Eval  Hip flexion    Hip extension    Hip abduction    Hip adduction    Hip internal rotation    Hip external rotation    Knee flexion    Knee extension -12 -10  Ankle dorsiflexion    Ankle plantarflexion    Ankle inversion    Ankle eversion     (Blank rows = not tested)  LOWER EXTREMITY MMT:    MMT Right Eval Left Eval  Hip flexion 5 4+  Hip extension    Hip abduction 5 5  Hip adduction 4 4  Hip internal rotation    Hip external rotation    Knee flexion 5 4+  Knee extension 5 5  Ankle dorsiflexion 4+ 4  Ankle plantarflexion    Ankle inversion    Ankle eversion    (Blank rows = not tested)  BED MOBILITY:  Reports difficulty and needs wife's assistance   TRANSFERS: Assistive device utilized: None  Sit to stand: SBA Stand to sit: SBA *Reports difficulty with sit to stand from low surfaces  GAIT: Gait pattern: step through pattern, decreased arm swing- Right, decreased arm swing- Left, decreased stride length, shuffling, trunk flexed, and narrow BOS Distance walked: 50 ft x 2 Assistive device utilized: None Level of assistance: SBA Comments: Wife reports shuffling gait at home.  FUNCTIONAL TESTS:  5 times sit to stand: 10.38 sec arms crossed at chest Timed up and go (TUG): 14.13 10 meter walk test: 9.19 sec  (3.57 ft/sec) 360 turn R:  30.78 sec, festination>30 steps To L:  17 steps, 7.59 sec 269M back:  16.82 sec (0.6 ft/sec) TUG cognitive:  18.38 sec    GOALS: Goals reviewed  with patient? Yes  SHORT TERM GOALS: Target date: 09/01/2023  Pt will be independent with HEP for improved balance, gait, posture. Baseline: needs cues Goal status: PARTIALLY MET 09/06/2023  2.  Pt will improve TUG/TUG cognitive score to less than or equal to 10% difference for decreased fall risk. Baseline: 14.13/18.38; 14.94, 16.25 sec 09/06/2023 Goal status: MET 09/06/2023  3.  Pt/wife will report at least 50% improvement in bed mobility and transfers, for decreased caregiver burden. Baseline: rates "a little" improvement 09/05/2023 Goal status: IN PROGRESS   LONG TERM GOALS: Target date:  09/29/2023   Pt will be independent with HEP for improved balance, gait, posture, functional mobility. Baseline:  Goal status: IN PROGRESS  2.  Pt will verbalize understanding of local Parkinson's disease community resources, including optimal fitness upon d/c from PT. Baseline:  Goal status: IN PROGRESS  3.  Pt will perform 360 degree turns to R and L in <10-12 steps, for improved balance. Baseline: 10-12 steps Goal status: MET  4.  MiniBESTest score to improve by at least 5 points for decreased fall risk. Baseline: 21/28; (09/18/23) 23/28 Goal status: IN PROGRESS  5.  Backwards gait velocity in 269M walk to improve to 1 ft/sec for improved balance. Baseline: 0.6 ft/sec; (09/18/23) 1.2 ft/sec Goal status: MET  ASSESSMENT:  CLINICAL IMPRESSION: Initiated with HIIT on NU-step to encourage rapid alternating movements with cues to sustain large amplitude stride  length but good ability to sustain rapid pace intervals.  Pt requests more focus to balance activities with impetus on ability to negotiate obstacles and step height in multi-directional challenge.  Static standing balance activities to improve postural stability under multi-sensory conditions. Dual-tasking reveals disruption to motor control requiring slower to stopped pace with walking and cognitive dual task. Continued sessions to  progress POC details to improve mobility and reduce risk for falls  OBJECTIVE IMPAIRMENTS: Abnormal gait, decreased balance, decreased knowledge of use of DME, decreased mobility, difficulty walking, decreased ROM, decreased strength, impaired flexibility, impaired tone, and postural dysfunction.   ACTIVITY LIMITATIONS: transfers, bed mobility, dressing, reach over head, hygiene/grooming, and locomotion level  PARTICIPATION LIMITATIONS: community activity, yard work, and fitness, travel  PERSONAL FACTORS: 3+ comorbidities: see above  are also affecting patient's functional outcome.   REHAB POTENTIAL: Good  CLINICAL DECISION MAKING: Evolving/moderate complexity  EVALUATION COMPLEXITY: Moderate  PLAN:  PT FREQUENCY: 2x/week  PT DURATION: 8 weeks plus eval  PLANNED INTERVENTIONS: 97110-Therapeutic exercises, 97530- Therapeutic activity, [id number]- Neuromuscular re-education, 97535- Self Care, 40981- Manual therapy, [phone]- Gait training, Patient/Family education, Balance training, Stair training, DME instructions, and Moist heat  PLAN FOR NEXT SESSION:  Recertification? Gait training with cane/rollator.  Initiate session with Nustep for interval aerobic training and review PWR! Moves-standing.  Continue to work on gait, bed mobility, car transfer sequences. Expressed interest in continuing PT sessions for recertification   4:24 PM, 09/25/23 M. Shary Decamp, PT, DPT Physical Therapist- [place] Office Number: [phone]

## 2023-09-27 NOTE — Therapy (Signed)
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   Allergic rhinitis    Dysphonia 08/27/2019   Gastroesophageal reflux disease 03/26/2019   History of malignant carcinoid tumor of large intestine    History of malignant carcinoid tumor of small intestine    Insomnia    Knee osteoarthritis    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   Male erectile dysfunction, unspecified    Parkinson's disease 12/09/2019   Rosacea, unspecified    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   Vocal fold atrophy    Past Surgical History:  Procedure Laterality Date   APPENDECTOMY     COLONOSCOPY  2004/2009/2015   INGUINAL HERNIA REPAIR Right    KNEE SURGERY     PARTIAL COLECTOMY     Patient Active Problem List   Diagnosis Date Noted   Mesenteric mass 07/08/2022   History of malignant carcinoid tumor 07/08/2022   Muscular deconditioning 01/30/2022   Acute metabolic encephalopathy 01/28/2022   History of malignant carcinoid tumor of small intestine 01/27/2022   Insomnia 01/27/2022   Mild cognitive impairment 01/27/2022   Abnormal CXR 01/27/2022   Abnormal CT scan 01/27/2022   Hyponatremia 01/27/2022   Transaminitis 01/27/2022   Parkinson's disease 12/09/2019   Allergies    Dysphonia 08/27/2019   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  Rationale for Evaluation and Treatment: Rehabilitation  SUBJECTIVE:                                                                                                                                                                                              SUBJECTIVE STATEMENT: Wife is ill with pneumonia. Pt reports physically he is feeling fine  Pt accompanied by: self  PERTINENT HISTORY: PD, memory change (suspect mild/mod PDD), depression,RBD, RLS, and insomnia, low back pain  PAIN:  Are you having pain? Yes: NPRS scale: 0/10 Pain location: back Pain description: muscle weakness Aggravating factors: bending over, standing too long Relieving factors: sitting  PRECAUTIONS: Fall  RED FLAGS: None   WEIGHT BEARING RESTRICTIONS: No  FALLS: Has patient fallen in last 6 months? Yes.  Number of falls 3 3 falls last week (at the beach)  LIVING ENVIRONMENT: Lives with: lives with their spouse Lives in: House/apartment Stairs: Yes: Internal: 16 steps; on right going up and External: 2-3 steps; on right going up Has following equipment at home: Single point cane  PLOF: Independent, Leisure: works out at [employer] several days/wk, and needing more assistance from wife for bed mobility and for dressing  PATIENT GOALS: Patient:  to help with balance and walking.  Wife:  to help with more movement in shoulders and help with better mobility.  OBJECTIVE:     TODAY'S TREATMENT: 09/28/23 Activity Comments                        HOME EXERCISE PROGRAM: Access Code: [email or web address] URL: [email or web address] Date: 08/07/2023 Prepared by: Shary Decamp  Exercises - Supine Lower Trunk Rotation  - 1 x daily - 7 x weekly - 1-3 sets - 10 reps - Sidelying Open Book Thoracic Lumbar Rotation and Extension  - 1 x daily - 7 x weekly - 3 sets - 10 reps - Prone on Elbows Reach  - 1 x daily - 7 x weekly - 1-3 sets - 10 reps - Seated Chop and Lift with Ball   - 1 x daily - 7 x weekly - 1-3 sets - 10 reps - Standing Diagonal Chops with Medicine Ball  - 1 x daily - 7 x weekly - 1-3 sets  - 10 reps  + standing PWR FLOW video 2x/day near counter top     Note: Objective measures were completed at Evaluation unless otherwise noted.  DIAGNOSTIC FINDINGS: NA  COGNITION: Overall cognitive status: History of cognitive impairments - at baseline   COORDINATION: Slowed alternating taps  MUSCLE TONE: LLE: Mild  POSTURE: rounded shoulders, forward head, and posterior pelvic tilt  UE ROM:  approx 100 degrees shoulder flex bilaterally, elbows are flexed   WFL abduction bilaterally, elbows flexed   WFL internal and external rotation,slowed  LOWER EXTREMITY ROM:     Active  Right Eval Left Eval  Hip flexion    Hip extension    Hip abduction    Hip adduction    Hip internal rotation    Hip external rotation    Knee flexion    Knee extension -12 -10  Ankle dorsiflexion    Ankle plantarflexion    Ankle inversion    Ankle eversion     (Blank rows = not tested)  LOWER EXTREMITY MMT:    MMT Right Eval Left Eval  Hip flexion 5 4+  Hip extension    Hip abduction 5 5  Hip adduction 4 4  Hip internal rotation    Hip external rotation    Knee flexion 5 4+  Knee extension 5 5  Ankle dorsiflexion 4+ 4  Ankle plantarflexion    Ankle inversion    Ankle eversion    (Blank rows = not tested)  BED MOBILITY:  Reports difficulty and needs wife's assistance   TRANSFERS: Assistive device utilized: None  Sit to stand: SBA Stand to sit: SBA *Reports difficulty with sit to stand from low surfaces  GAIT: Gait pattern: step through pattern, decreased arm swing- Right, decreased arm swing- Left, decreased stride length, shuffling, trunk flexed, and narrow BOS Distance walked: 50 ft x 2 Assistive device utilized: None Level of assistance: SBA Comments: Wife reports shuffling gait at home.  FUNCTIONAL TESTS:  5 times sit to stand: 10.38 sec arms crossed  at chest Timed up and go (TUG): 14.13 10 meter walk test: 9.19 sec  (3.57 ft/sec) 360 turn R:  30.78 sec,  festination>30 steps To L:  17 steps, 7.59 sec 37M back:  16.82 sec (0.6 ft/sec) TUG cognitive:  18.38 sec    GOALS: Goals reviewed with patient? Yes  SHORT TERM GOALS: Target date: 09/01/2023  Pt will be independent with HEP for improved balance, gait, posture. Baseline: needs cues Goal status: PARTIALLY MET 09/06/2023  2.  Pt will improve TUG/TUG cognitive score to less than or equal to 10% difference for decreased fall risk. Baseline: 14.13/18.38; 14.94, 16.25 sec 09/06/2023 Goal status: MET 09/06/2023  3.  Pt/wife will report at least 50% improvement in bed mobility and transfers, for decreased caregiver burden. Baseline: rates "a little" improvement 09/05/2023 Goal status: IN PROGRESS   LONG TERM GOALS: Target date:  09/29/2023   Pt will be independent with HEP for improved balance, gait, posture, functional mobility. Baseline:  Goal status: IN PROGRESS  2.  Pt will verbalize understanding of local Parkinson's disease community resources, including optimal fitness upon d/c from PT. Baseline:  Goal status: IN PROGRESS  3.  Pt will perform 360 degree turns to R and L in <10-12 steps, for improved balance. Baseline: 10-12 steps Goal status: MET  4.  MiniBESTest score to improve by at least 5 points for decreased fall risk. Baseline: 21/28; (09/18/23) 23/28 Goal status: IN PROGRESS  5.  Backwards gait velocity in 37M walk to improve to 1 ft/sec for improved balance. Baseline: 0.6 ft/sec; (09/18/23) 1.2 ft/sec Goal status: MET  ASSESSMENT:  CLINICAL IMPRESSION: Initiated with HIIT on NU-step to encourage rapid alternating movements with cues to sustain large amplitude stride length but good ability to sustain rapid pace intervals.  Pt requests more focus to balance activities with impetus on ability to negotiate obstacles and step height in multi-directional challenge.  Static standing balance activities to improve postural stability under multi-sensory conditions.  Dual-tasking reveals disruption to motor control requiring slower to stopped pace with walking and cognitive dual task. Continued sessions to progress POC details to improve mobility and reduce risk for falls  OBJECTIVE IMPAIRMENTS: Abnormal gait, decreased balance, decreased knowledge of use of DME, decreased mobility, difficulty walking, decreased ROM, decreased strength, impaired flexibility, impaired tone, and postural dysfunction.   ACTIVITY LIMITATIONS: transfers, bed mobility, dressing, reach over head, hygiene/grooming, and locomotion level  PARTICIPATION LIMITATIONS: community activity, yard work, and fitness, travel  PERSONAL FACTORS: 3+ comorbidities: see above  are also affecting patient's functional outcome.   REHAB POTENTIAL: Good  CLINICAL DECISION MAKING: Evolving/moderate complexity  EVALUATION COMPLEXITY: Moderate  PLAN:  PT FREQUENCY: 2x/week  PT DURATION: 8 weeks plus eval  PLANNED INTERVENTIONS: 97110-Therapeutic exercises, 97530- Therapeutic activity, [id number]- Neuromuscular re-education, 97535- Self Care, 16109- Manual therapy, [phone]- Gait training, Patient/Family education, Balance training, Stair training, DME instructions, and Moist heat  PLAN FOR NEXT SESSION:  Recertification? Gait training with cane/rollator.  Initiate session with Nustep for interval aerobic training and review PWR! Moves-standing.  Continue to work on gait, bed mobility, car transfer sequences. Expressed interest in continuing PT sessions for recertification

## 2023-09-28 ENCOUNTER — Encounter: Payer: Self-pay | Admitting: Physical Therapy

## 2023-09-28 ENCOUNTER — Ambulatory Visit: Payer: Medicare Other | Admitting: Physical Therapy

## 2023-09-28 DIAGNOSIS — R293 Abnormal posture: Secondary | ICD-10-CM | POA: Diagnosis not present

## 2023-09-28 DIAGNOSIS — R29818 Other symptoms and signs involving the nervous system: Secondary | ICD-10-CM

## 2023-09-28 DIAGNOSIS — R2689 Other abnormalities of gait and mobility: Secondary | ICD-10-CM | POA: Diagnosis not present

## 2023-09-28 DIAGNOSIS — R2681 Unsteadiness on feet: Secondary | ICD-10-CM | POA: Diagnosis not present

## 2023-10-08 DIAGNOSIS — H1031 Unspecified acute conjunctivitis, right eye: Secondary | ICD-10-CM | POA: Diagnosis not present

## 2023-10-14 ENCOUNTER — Other Ambulatory Visit (HOSPITAL_BASED_OUTPATIENT_CLINIC_OR_DEPARTMENT_OTHER): Payer: Self-pay

## 2023-10-14 ENCOUNTER — Other Ambulatory Visit: Payer: Self-pay | Admitting: Neurology

## 2023-10-14 DIAGNOSIS — G20B2 Parkinson's disease with dyskinesia, with fluctuations: Secondary | ICD-10-CM

## 2023-10-16 ENCOUNTER — Other Ambulatory Visit (HOSPITAL_BASED_OUTPATIENT_CLINIC_OR_DEPARTMENT_OTHER): Payer: Self-pay

## 2023-10-16 MED ORDER — RYTARY 48.75-195 MG PO CPCR
1.0000 | ORAL_CAPSULE | Freq: Four times a day (QID) | ORAL | 0 refills | Status: DC
  Filled 2023-10-16 – 2023-10-20 (×2): qty 360, 90d supply, fill #0

## 2023-10-17 ENCOUNTER — Other Ambulatory Visit: Payer: Self-pay

## 2023-10-17 ENCOUNTER — Other Ambulatory Visit (HOSPITAL_BASED_OUTPATIENT_CLINIC_OR_DEPARTMENT_OTHER): Payer: Self-pay

## 2023-10-18 ENCOUNTER — Other Ambulatory Visit (HOSPITAL_BASED_OUTPATIENT_CLINIC_OR_DEPARTMENT_OTHER): Payer: Self-pay

## 2023-10-19 ENCOUNTER — Telehealth: Payer: Self-pay | Admitting: Neurology

## 2023-10-19 ENCOUNTER — Other Ambulatory Visit (HOSPITAL_BASED_OUTPATIENT_CLINIC_OR_DEPARTMENT_OTHER): Payer: Self-pay

## 2023-10-19 NOTE — Telephone Encounter (Signed)
 Called patients wife and we discussed that they are at the beginning of the year and probably need to get the deductible paid for the price to go down

## 2023-10-19 NOTE — Telephone Encounter (Signed)
 Left message with the after hour service on 10-19-23 at 12:13 pm   Caller states that she is concern about medication her husband is taking. He is having facial dyskinesia  the price of the medication is $500.00 she would like a alternative medication

## 2023-10-20 ENCOUNTER — Other Ambulatory Visit (HOSPITAL_BASED_OUTPATIENT_CLINIC_OR_DEPARTMENT_OTHER): Payer: Self-pay

## 2023-10-25 ENCOUNTER — Other Ambulatory Visit: Payer: Self-pay

## 2023-10-25 ENCOUNTER — Ambulatory Visit: Payer: Medicare Other | Attending: Neurology

## 2023-10-25 DIAGNOSIS — R41841 Cognitive communication deficit: Secondary | ICD-10-CM | POA: Insufficient documentation

## 2023-10-25 DIAGNOSIS — J385 Laryngeal spasm: Secondary | ICD-10-CM | POA: Insufficient documentation

## 2023-10-25 DIAGNOSIS — R490 Dysphonia: Secondary | ICD-10-CM | POA: Diagnosis not present

## 2023-10-25 DIAGNOSIS — R1312 Dysphagia, oropharyngeal phase: Secondary | ICD-10-CM | POA: Insufficient documentation

## 2023-10-25 DIAGNOSIS — R471 Dysarthria and anarthria: Secondary | ICD-10-CM | POA: Diagnosis not present

## 2023-10-25 NOTE — Therapy (Signed)
 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   Allergic rhinitis    Dysphonia 08/27/2019   Gastroesophageal reflux disease 03/26/2019   History of malignant carcinoid tumor of large intestine    History of malignant carcinoid tumor of small intestine    Insomnia    Knee osteoarthritis    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   Male erectile dysfunction, unspecified    Parkinson's disease 12/09/2019   Rosacea, unspecified    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   Vocal fold atrophy    Past Surgical History:  Procedure Laterality Date   APPENDECTOMY     COLONOSCOPY  2004/2009/2015   INGUINAL HERNIA REPAIR Right    KNEE SURGERY     PARTIAL COLECTOMY     Patient Active Problem List   Diagnosis Date Noted   Mesenteric mass 07/08/2022   History of malignant carcinoid tumor 07/08/2022   Muscular deconditioning 01/30/2022   Acute metabolic encephalopathy 01/28/2022   History of malignant carcinoid tumor of small intestine 01/27/2022   Insomnia 01/27/2022   Mild cognitive impairment 01/27/2022   Abnormal CXR 01/27/2022   Abnormal CT scan 01/27/2022   Hyponatremia 01/27/2022   Transaminitis 01/27/2022   Parkinson's disease 12/09/2019   Allergies    Dysphonia 08/27/2019   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   Gastroesophageal reflux disease 03/26/2019   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    ONSET DATE: Script  dated 10/24/22  REFERRING DIAG:  G20.B2 (ICD-10-CM) - Parkinson's disease with dyskinesia and fluctuating manifestations (HCC)    THERAPY DIAG:  Dysphagia, oropharyngeal phase  Dysarthria and anarthria  Cognitive communication deficit  Rationale for Evaluation and Treatment: Rehabilitation  SUBJECTIVE:   SUBJECTIVE STATEMENT: "I saw Josefina Nian - and the shorter male." (Pt, who he saw for PT)  Pt accompanied by:  aide Terry ([facility]) with pt 12:30 - 4:30 Tuesday, Wednesday, Friday  PERTINENT HISTORY: PD, memory change (suspect mild/mod PDD), depression,RBD, RLS, and insomnia, low back pain  Had ST in Summer 2020 (voice based ST), and in Spring 2021 (SpeakOut!)  PAIN:  Are you having pain? No  FALLS: Has patient fallen in last 6 months? Yes. Number of falls 3  LIVING ENVIRONMENT: Lives with: lives with their spouse and gets aide services from [facility] x3/week Lives in: House/apartment  PLOF:  Level of assistance: Comment: Needs assist from wife with dressing and bed mobility Employment: Retired  PATIENT GOALS: Improve effectiveness of speech  OBJECTIVE:  Note: Objective measures were completed at Evaluation unless otherwise noted.  DIAGNOSTIC FINDINGS:  MBS 08/24/23 Clinical Impression: Clinical Impression: Pt presents with mild oropharyngeal dysphagia c/b decreased lingual control to hold thin bolus during initial swallow, but within normal limits for subsequent swallows.  Pt with min disorganized chewing and short episode of lingual pumping with solids, but was able to fully masticate  and clear bolus from oral cavity.  race residue noted and cleared with subsequent swallows.  Brisk tongue motion with timely swallow response throughout study.  Pt did exhibit partial anterior laryngeal elevation/partial epiglottic inversion resulting in incomplete airway closure and smaller volume of thin liquids (tsp amount) entering laryngeal vestibule above level of the vocal  cords.  Pt exhibited a weak cough, but withverbal cues, he was able to cough material out of the airway.  No other episodes of laryngeal penetration/aspiration noted during study.  Slight lingual residue d/t decreased BOT/posterior pharyngeal wall column residue noted, but cleared with either liquid wash or repetitive swallows. Esophageal clearance good for all consistencies.  Pharyngeal stripping wave adequate.  Recommend continue outpatient ST to increase vocal quality/dysphagia tx for improved breath support for voice/cough strength to assist with /improving vocal intensity/quality and dysphagia tx/management.  Recommend continue current diet of Regular/thin liquids with repetitive swallows and liquid wash intermittently implemented during all po intake.  Thank you for this consult.   Factors that may increase risk of adverse event in presence of aspiration Roderick Civatte & Jessy Morocco 2021): Factors that may increase risk of adverse event in presence of aspiration Roderick Civatte & Jessy Morocco 2021): Poor general health and/or compromised immunity; Reduced cognitive function; Limited mobility; Frail or deconditioned; Inadequate oral hygiene; Weak cough   Recommendations/Plan: Swallowing Evaluation Recommendations Swallowing Evaluation Recommendations Recommendations: PO diet PO Diet Recommendation: Regular; Thin liquids (Level 0) Liquid Administration via: Cup Medication Administration: Whole meds with liquid Supervision: Patient able to self-feed; Intermittent supervision/cueing for swallowing strategies Swallowing strategies  : Slow rate; Small bites/sips; Multiple dry swallows after each bite/sip; Follow solids with liquids Postural changes: Position pt fully upright for meals Oral care recommendations: Oral care BID (2x/day)  COGNITION: Overall cognitive status: History of cognitive impairments - at baseline Areas of impairment: Memory and Following commands Comments: Pt is having more difficulty with memory and  processing speed.   MOTOR SPEECH: Overall motor speech: impaired Level of impairment: Word, Phrase, Sentence, and Conversation Respiration: thoracic breathing Phonation: low vocal intensity Resonance: WFL Articulation: Impaired: word, phrase, sentence, and conversation Intelligibility: Intelligible however when door open to ST room pt intelligibility suffered Motor planning: Appears intact Motor speech errors: unaware and consistent Interfering components: premorbid status Effective technique: increased vocal intensity  ORAL MOTOR EXAMINATION: Overall status: Impaired: Labial: Bilateral (ROM and Coordination) Lingual: Right (ROM, Strength, and Coordination), and Left, ROM and coordination Comments: mild hypomemia noted  OBJECTIVE VOICE ASSESSMENT: Sustained "ah" maximum phonation time: 11.21 seconds Sustained "ah" loudness average: 82 dB with model and usual min A for loudness and eliminating loudness decay Oral reading (passage) loudness average: 67 dB Oral reading loudness range: 63-72 dB Conversational loudness average: 63 dB Conversational loudness range: 56-66dB Voice quality: breathy, harsh, and low vocal intensity and rare aphonic voice Stimulability trials: Given SLP modeling and occasional mod cues, loudness average increased to average 70dB (range of 63 to 73) at reading at paragraph level. Pt averaged 68dB with completing sentences given SLP occasional min A for intent and "throwing voice".  Comments: Cognitive load impacted pt's ability to respond with louder and more intentional speech.  Completed audio recording of patients baseline voice without cueing from SLP: No   PATIENT REPORTED OUTCOME MEASURES (PROM): Communication Effectiveness Survey: 11/32, with lower scores indicating greater impact of deficit on communicative effectiveness  (score taken from previous evaluation on 08/07/23)  TODAY'S TREATMENT:  DATE:  10/25/23 (eval): SWALLOWING: SLP educated pt and aide about MBS results, and swallow precautions; pt was unsure if he was using any of precautions at home; aide said he was eating slowly. SLP will assess pt's use of precautions next session. Pt will need to perform HEP as directed for progress. SPEECH: SLP shaped pt's intentional voice with sentence completion. SLP educated pt and aide about intentional speech and how it improves loudness. SLP explained/educated aide on why intentional speech is necessary to improve effectiveness of communication. Pt will need to perform HEP as directed for progress  PATIENT EDUCATION: Education details: Eval results, see "today's treatment" Person educated: Patient and aide Education method: Explanation Education comprehension: verbalized understanding and needs further education  HOME EXERCISE PROGRAM: Dysphagia HEP provided next session, Speech exercises provided next session   GOALS: Goals reviewed with patient? Yes, in general  SHORT TERM GOALS: Target date: 11/25/23  Pt will demo volume of average 68dB in sentence responses with focus on increased intent and purpose in 3 sessions Baseline: Goal status: INITIAL  2.  Pt will improve speech volume in 2 minutes simple conversation to 68dB with rare min A in 2 sessions  Baseline:  Goal status: INITIAL  3.  Pt will complete at least 6 lessons of SpeakOut!/week for 2 weeks, as noted on practice tracker sheet Baseline:  Goal status: INITIAL  4.  Pt should undergo MBS, and then follow precautions from MBS (if ordered) with modified independence in 2 sessions Baseline:  Goal status: INITIAL  5.  Pt will undergo objective cognitive communication assessment in first 4 sessions (goals added PRN) Baseline:  Goal status: INITIAL   LONG TERM GOALS: Target date: 12/23/23  Pt will improve speech volume in 5 minutes simple  conversation to 68dB with rare min A in 3 sessions  Baseline:  Goal status: INITIAL  2.  Pt will have completed SpeakOut warm up tasks (M-words, ah, glides, numbers) at least 6 days/week for 2 weeks after 10/23/23, as noted on practice tracker sheet Baseline:  Goal status: INITIAL  3.  Pt will follow precautions from MBS (if ordered) with modified independence in 3 sessions, after 10/23/23 Baseline:  Goal status: INITIAL  4.  Pt will demo in  3 sessions or (between 3 essions) report successful usage of memory compensations  Baseline:  Goal status: INITIAL   ASSESSMENT:  CLINICAL IMPRESSION: Patient is a 78 y.o. M who was seen today for assessment of cognition in light of pt's PD. Pt's current PD med regimen was recommended to change  carbidopa /levodopa  25/100, 1.5 tabs at 7am/11am/4pm to rytary  195 mg, 1 capsule 4 times per day, 1 at 7am/11am/4pm/bedtime, to better assist decr dyskinesia and improve cognition. Today, pt improved loudness/clarity with focus on using intentional speech, but not to WNL loudness levels. Prognosis is good for improvement of speech intelligibility given this performance today. A MBS was completed 08/24/23 with results above. Formal cognitive communication assessment will be completed within the first 4 weeks and goals added PRN.  OBJECTIVE IMPAIRMENTS: Objective impairments include memory, executive functioning, receptive language, dysarthria, and dysphagia. These impairments are limiting patient from household responsibilities, ADLs/IADLs, and effectively communicating at home and in community.Factors affecting potential to achieve goals and functional outcome are co-morbidities and medical prognosis.. Patient will benefit from skilled SLP services to address above impairments and improve overall function.  REHAB POTENTIAL: Good  PLAN:  SLP FREQUENCY: 1-2x/week  SLP DURATION: 8 weeks  PLANNED INTERVENTIONS: 92526 Treatment of swallowing function, 92507  Treatment of speech (30 or 45 min) , Aspiration precaution training, Pharyngeal strengthening exercises, Diet toleration management , Environmental controls, Trials of upgraded texture/liquids, Cognitive reorganization, Internal/external aids, Functional tasks, SLP instruction and feedback, Compensatory strategies, and Patient/family education    [facility], CCC-SLP 10/25/2023, 7:02 PM

## 2023-10-27 ENCOUNTER — Ambulatory Visit: Payer: Medicare Other

## 2023-10-27 DIAGNOSIS — R41841 Cognitive communication deficit: Secondary | ICD-10-CM | POA: Diagnosis not present

## 2023-10-27 DIAGNOSIS — R1312 Dysphagia, oropharyngeal phase: Secondary | ICD-10-CM

## 2023-10-27 DIAGNOSIS — R471 Dysarthria and anarthria: Secondary | ICD-10-CM | POA: Diagnosis not present

## 2023-10-27 DIAGNOSIS — J385 Laryngeal spasm: Secondary | ICD-10-CM | POA: Diagnosis not present

## 2023-10-27 DIAGNOSIS — R490 Dysphonia: Secondary | ICD-10-CM | POA: Diagnosis not present

## 2023-10-27 NOTE — Therapy (Signed)
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   Allergic rhinitis    Dysphonia 08/27/2019   Gastroesophageal reflux disease 03/26/2019   History of malignant carcinoid tumor of large intestine    History of malignant carcinoid tumor of small intestine    Insomnia    Knee osteoarthritis    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   Male erectile dysfunction, unspecified    Parkinson's disease 12/09/2019   Rosacea, unspecified    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   Vocal fold atrophy    Past Surgical History:  Procedure Laterality Date   APPENDECTOMY     COLONOSCOPY  2004/2009/2015   INGUINAL HERNIA REPAIR Right    KNEE SURGERY     PARTIAL COLECTOMY     Patient Active Problem List   Diagnosis Date Noted   Mesenteric mass 07/08/2022   History of malignant carcinoid tumor 07/08/2022   Muscular deconditioning 01/30/2022   Acute metabolic encephalopathy 01/28/2022   History of malignant carcinoid tumor of small intestine 01/27/2022   Insomnia 01/27/2022   Mild cognitive impairment 01/27/2022   Abnormal CXR 01/27/2022   Abnormal CT scan 01/27/2022   Hyponatremia 01/27/2022   Transaminitis 01/27/2022   Parkinson's disease 12/09/2019   Allergies    Dysphonia 08/27/2019   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   Gastroesophageal reflux disease 03/26/2019   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    ONSET DATE: Script  dated 10/24/22  REFERRING DIAG:  G20.B2 (ICD-10-CM) - Parkinson's disease with dyskinesia and fluctuating manifestations (HCC)    THERAPY DIAG:  Dysphagia, oropharyngeal phase  Dysarthria and anarthria  Cognitive communication deficit  Rationale for Evaluation and Treatment: Rehabilitation  SUBJECTIVE:   SUBJECTIVE STATEMENT: "He does his (therapy exercise) and when he is done it's just like it was before he did it."  Pt accompanied by: significant other Lynne  PERTINENT HISTORY: PD, memory change (suspect mild/mod PDD), depression,RBD, RLS, and insomnia, low back pain  Had ST in Summer 2020 (voice based ST), and in Spring 2021 ([employer]!)  PAIN:  Are you having pain? No  FALLS: Has patient fallen in last 6 months? Yes. Number of falls 3  LIVING ENVIRONMENT: Lives with: lives with their spouse and gets aide services from [facility] x3/week Lives in: House/apartment  PLOF:  Level of assistance: Comment: Needs assist from wife with dressing and bed mobility Employment: Retired  PATIENT GOALS: Improve effectiveness of speech  OBJECTIVE:  Note: Objective measures were completed at Evaluation unless otherwise noted.  DIAGNOSTIC FINDINGS:  MBS 08/24/23 Clinical Impression: Clinical Impression: Pt presents with mild oropharyngeal dysphagia c/b decreased lingual control to hold thin bolus during initial swallow, but within normal limits for subsequent swallows.  Pt with min disorganized chewing and short episode of lingual pumping with solids, but was able to fully masticate and clear bolus from  oral cavity.  race residue noted and cleared with subsequent swallows.  Brisk tongue motion with timely swallow response throughout study.  Pt did exhibit partial anterior laryngeal elevation/partial epiglottic inversion resulting in incomplete airway closure and smaller volume of thin liquids (tsp amount) entering laryngeal vestibule above level of the vocal cords.  Pt exhibited a weak  cough, but withverbal cues, he was able to cough material out of the airway.  No other episodes of laryngeal penetration/aspiration noted during study.  Slight lingual residue d/t decreased BOT/posterior pharyngeal wall column residue noted, but cleared with either liquid wash or repetitive swallows. Esophageal clearance good for all consistencies.  Pharyngeal stripping wave adequate.  Recommend continue outpatient ST to increase vocal quality/dysphagia tx for improved breath support for voice/cough strength to assist with /improving vocal intensity/quality and dysphagia tx/management.  Recommend continue current diet of Regular/thin liquids with repetitive swallows and liquid wash intermittently implemented during all po intake.  Thank you for this consult.   Factors that may increase risk of adverse event in presence of aspiration Rubye Oaks & Clearance Coots 2021): Factors that may increase risk of adverse event in presence of aspiration Rubye Oaks & Clearance Coots 2021): Poor general health and/or compromised immunity; Reduced cognitive function; Limited mobility; Frail or deconditioned; Inadequate oral hygiene; Weak cough   Recommendations/Plan: Swallowing Evaluation Recommendations Swallowing Evaluation Recommendations Recommendations: PO diet PO Diet Recommendation: Regular; Thin liquids (Level 0) Liquid Administration via: Cup Medication Administration: Whole meds with liquid Supervision: Patient able to self-feed; Intermittent supervision/cueing for swallowing strategies Swallowing strategies  : Slow rate; Small bites/sips; Multiple dry swallows after each bite/sip; Follow solids with liquids Postural changes: Position pt fully upright for meals Oral care recommendations: Oral care BID (2x/day)   PATIENT REPORTED OUTCOME MEASURES (PROM): Communication Effectiveness Survey: 11/32, with lower scores indicating greater impact of deficit on communicative effectiveness  (score taken from previous evaluation on  08/07/23)  TODAY'S TREATMENT:                                                                                                                                         DATE:  10/27/23: SLP worked with pt on following all precautions from [facility], with wife present. SLP reviewed precautions with examples, then provided POs for pt 90 seconds later. SLP had to cue for dry swallow after bite/sip and to alternate solids and liquids. SLP told Desmond Mcewan may need cueing initially at mealtimes to recall to use strategies, based upon this today. SLP cued pt for extra swallow rarely after initial cue. He was independent with alternate bite/sip after initial cue. SLP also taught pt effortful swallow - will provide Mendelsohn, and Masako or chin tuck against resistance next session. Will also provide speech intelligiblity/loudness tx next session  10/25/23 (eval): SWALLOWING: SLP educated pt and aide about MBS results, and swallow precautions; pt was unsure if he was using any of precautions at home; aide  said he was eating slowly. SLP will assess pt's use of precautions next session. Pt will need to perform HEP as directed for progress. SPEECH: SLP shaped pt's intentional voice with sentence completion. SLP educated pt and aide about intentional speech and how it improves loudness. SLP explained/educated aide on why intentional speech is necessary to improve effectiveness of communication. Pt will need to perform HEP as directed for progress  PATIENT EDUCATION: Education details: Eval results, see "today's treatment" Person educated: Patient and aide Education method: Explanation Education comprehension: verbalized understanding and needs further education  HOME EXERCISE PROGRAM: Dysphagia HEP,speech exercises    GOALS: Goals reviewed with patient? Yes, in general  SHORT TERM GOALS: Target date: 11/25/23  Pt will demo volume of average 68dB in sentence responses with focus on increased intent and purpose in 3  sessions Baseline: Goal status: INITIAL  2.  Pt will improve speech volume in 2 minutes simple conversation to 68dB with rare min A in 2 sessions  Baseline:  Goal status: INITIAL  3.  Pt will complete at least 6 lessons of SpeakOut!/week for 2 weeks, as noted on practice tracker sheet Baseline:  Goal status: INITIAL  4.  Pt should undergo MBS, and then follow precautions from MBS (if ordered) with modified independence in 2 sessions Baseline:  Goal status: INITIAL  5.  Pt will undergo objective cognitive communication assessment in first 4 sessions (goals added PRN) Baseline:  Goal status: INITIAL   LONG TERM GOALS: Target date: 12/23/23  Pt will improve speech volume in 5 minutes simple conversation to 68dB with rare min A in 3 sessions  Baseline:  Goal status: INITIAL  2.  Pt will have completed SpeakOut warm up tasks (M-words, ah, glides, numbers) at least 6 days/week for 2 weeks after 10/23/23, as noted on practice tracker sheet Baseline:  Goal status: INITIAL  3.  Pt will follow precautions from MBS (if ordered) with modified independence in 3 sessions, after 10/23/23 Baseline:  Goal status: INITIAL  4.  Pt will demo in  3 sessions or (between 3 essions) report successful usage of memory compensations  Baseline:  Goal status: INITIAL   ASSESSMENT:  CLINICAL IMPRESSION: Patient is a 78 y.o. M who was seen today for treatment swallowing in light of PD. See "Today's treatment" for more details. Formal cognitive communication assessment will be completed within the first 4 weeks and goals added PRN.  OBJECTIVE IMPAIRMENTS: Objective impairments include memory, executive functioning, receptive language, dysarthria, and dysphagia. These impairments are limiting patient from household responsibilities, ADLs/IADLs, and effectively communicating at home and in community.Factors affecting potential to achieve goals and functional outcome are co-morbidities and medical  prognosis.. Patient will benefit from skilled SLP services to address above impairments and improve overall function.  REHAB POTENTIAL: Good  PLAN:  SLP FREQUENCY: 1-2x/week  SLP DURATION: 8 weeks  PLANNED INTERVENTIONS: 92526 Treatment of swallowing function, 92507 Treatment of speech (30 or 45 min) , Aspiration precaution training, Pharyngeal strengthening exercises, Diet toleration management , Environmental controls, Trials of upgraded texture/liquids, Cognitive reorganization, Internal/external aids, Functional tasks, SLP instruction and feedback, Compensatory strategies, and Patient/family education    [facility], CCC-SLP 10/27/2023, 12:25 PM

## 2023-10-27 NOTE — Patient Instructions (Addendum)
   WHEN YOU EAT:  Multiple dry swallows after each bite or sip;   Alternate solid and liquid for your meals   =================================  EXCERCISES  Swallow as hard as you can, 10 times, twice a day  Dunk a spoon into water to get 1-2 drops to swallow

## 2023-10-30 ENCOUNTER — Ambulatory Visit: Payer: Medicare Other

## 2023-10-30 DIAGNOSIS — R41841 Cognitive communication deficit: Secondary | ICD-10-CM

## 2023-10-30 DIAGNOSIS — R1312 Dysphagia, oropharyngeal phase: Secondary | ICD-10-CM | POA: Diagnosis not present

## 2023-10-30 DIAGNOSIS — R490 Dysphonia: Secondary | ICD-10-CM | POA: Diagnosis not present

## 2023-10-30 DIAGNOSIS — R471 Dysarthria and anarthria: Secondary | ICD-10-CM | POA: Diagnosis not present

## 2023-10-30 DIAGNOSIS — J385 Laryngeal spasm: Secondary | ICD-10-CM | POA: Diagnosis not present

## 2023-10-30 NOTE — Therapy (Signed)
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   Allergic rhinitis    Dysphonia 08/27/2019   Gastroesophageal reflux disease 03/26/2019   History of malignant carcinoid tumor of large intestine    History of malignant carcinoid tumor of small intestine    Insomnia    Knee osteoarthritis    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   Male erectile dysfunction, unspecified    Parkinson's disease 12/09/2019   Rosacea, unspecified    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   Vocal fold atrophy    Past Surgical History:  Procedure Laterality Date   APPENDECTOMY     COLONOSCOPY  2004/2009/2015   INGUINAL HERNIA REPAIR Right    KNEE SURGERY     PARTIAL COLECTOMY     Patient Active Problem List   Diagnosis Date Noted   Mesenteric mass 07/08/2022   History of malignant carcinoid tumor 07/08/2022   Muscular deconditioning 01/30/2022   Acute metabolic encephalopathy 01/28/2022   History of malignant carcinoid tumor of small intestine 01/27/2022   Insomnia 01/27/2022   Mild cognitive impairment 01/27/2022   Abnormal CXR 01/27/2022   Abnormal CT scan 01/27/2022   Hyponatremia 01/27/2022   Transaminitis 01/27/2022   Parkinson's disease 12/09/2019   Allergies    Dysphonia 08/27/2019   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 "Something about 10 swallows," pt stated as he sat down in ST room.  Pt accompanied by: significant other Lynne  PERTINENT HISTORY: PD, memory change (suspect mild/mod PDD), depression,RBD, RLS, and insomnia, low back pain  Had ST in Summer 2020 (voice based ST), and in Spring 2021 (SpeakOut!)  PAIN:  Are you having pain? No  FALLS: Has patient fallen in last 6 months? Yes. Number of falls 3  PATIENT GOALS: Improve effectiveness of speech  OBJECTIVE:  Note: Objective measures were completed at Evaluation unless otherwise noted.  DIAGNOSTIC FINDINGS:  MBS 08/24/23 Clinical Impression: Clinical Impression: Pt presents with mild oropharyngeal dysphagia c/b decreased lingual control to hold thin bolus during initial swallow, but within normal limits for subsequent swallows.  Pt with min disorganized chewing and short episode of lingual pumping with solids, but was able to fully masticate and clear bolus from oral cavity.  race residue noted and cleared with subsequent swallows.  Brisk tongue motion with timely swallow response throughout study.  Pt did exhibit partial anterior laryngeal elevation/partial epiglottic inversion resulting in incomplete airway closure  and smaller volume of thin liquids (tsp amount) entering laryngeal vestibule above level of the vocal cords.  Pt exhibited a weak cough, but withverbal cues, he was able to cough material out of the airway.  No other episodes of laryngeal penetration/aspiration noted during study.  Slight lingual residue d/t decreased BOT/posterior pharyngeal wall column residue  noted, but cleared with either liquid wash or repetitive swallows. Esophageal clearance good for all consistencies.  Pharyngeal stripping wave adequate.  Recommend continue outpatient ST to increase vocal quality/dysphagia tx for improved breath support for voice/cough strength to assist with /improving vocal intensity/quality and dysphagia tx/management.  Recommend continue current diet of Regular/thin liquids with repetitive swallows and liquid wash intermittently implemented during all po intake.  Thank you for this consult.   Factors that may increase risk of adverse event in presence of aspiration Rubye Oaks & Clearance Coots 2021): Factors that may increase risk of adverse event in presence of aspiration Rubye Oaks & Clearance Coots 2021): Poor general health and/or compromised immunity; Reduced cognitive function; Limited mobility; Frail or deconditioned; Inadequate oral hygiene; Weak cough   Recommendations/Plan: Swallowing Evaluation Recommendations Swallowing Evaluation Recommendations Recommendations: PO diet PO Diet Recommendation: Regular; Thin liquids (Level 0) Liquid Administration via: Cup Medication Administration: Whole meds with liquid Supervision: Patient able to self-feed; Intermittent supervision/cueing for swallowing strategies Swallowing strategies  : Slow rate; Small bites/sips; Multiple dry swallows after each bite/sip; Follow solids with liquids Postural changes: Position pt fully upright for meals Oral care recommendations: Oral care BID (2x/day)   PATIENT REPORTED OUTCOME MEASURES (PROM): Communication Effectiveness Survey: 11/32, with lower scores indicating greater impact of deficit on communicative effectiveness  (score taken from previous evaluation on 08/07/23)  TODAY'S TREATMENT:                                                                                                                                         DATE:  10/30/23: SLP taught pt Clair Gulling and Masako. In addition to  the effortful swallow, this will be pt's HEP to complete at home. Goal for HEP added.  Throughout session SLP had to ask pt to repeat speech due to hypophonia.  10/27/23: SLP worked with pt on following all precautions from [facility], with wife present. SLP reviewed precautions with examples, then provided POs for pt 90 seconds later. SLP had to cue for dry swallow after bite/sip and to alternate solids and liquids. SLP told Jacarious Ruter may need cueing initially at mealtimes to recall to use strategies, based upon this today. SLP cued pt for extra swallow rarely after initial cue. He was independent with alternate bite/sip after initial cue. SLP also taught pt effortful swallow - will provide Mendelsohn, and Masako or chin tuck against resistance next session. Will also provide speech intelligiblity/loudness tx next session  10/25/23 (eval): SWALLOWING: SLP educated pt and aide about MBS results, and swallow precautions; pt was unsure if he was using any  of precautions at home; aide said he was eating slowly. SLP will assess pt's use of precautions next session. Pt will need to perform HEP as directed for progress. SPEECH: SLP shaped pt's intentional voice with sentence completion. SLP educated pt and aide about intentional speech and how it improves loudness. SLP explained/educated aide on why intentional speech is necessary to improve effectiveness of communication. Pt will need to perform HEP as directed for progress  PATIENT EDUCATION: Education details: see "today's treatment" Person educated: Patient Education method: Explanation, Demonstration, Verbal cues, and Handouts Education comprehension: verbalized understanding, returned demonstration, verbal cues required, and needs further education  HOME EXERCISE PROGRAM: Dysphagia HEP,speech exercises    GOALS: Goals reviewed with patient? Yes, in general  SHORT TERM GOALS: Target date: 11/25/23  Pt will demo volume of average 68dB in sentence  responses with focus on increased intent and purpose in 3 sessions Baseline: Goal status: INITIAL  2.  Pt will improve speech volume in 2 minutes simple conversation to 68dB with rare min A in 2 sessions  Baseline:  Goal status: INITIAL  3.  Pt will complete at least 6 lessons of SpeakOut!/week for 2 weeks, as noted on practice tracker sheet Baseline:  Goal status: INITIAL  4.  Pt should undergo MBS, and then follow precautions from MBS (if ordered) with modified independence in 2 sessions Baseline:  Goal status: INITIAL  5. Pt will perform HEP for dysphagia with occasional min cues in 3 sessions Baseline:  Goal status: INITIAL  6.  Pt will undergo objective cognitive communication assessment in first 4 sessions (goals added PRN) Baseline:  Goal status: INITIAL   LONG TERM GOALS: Target date: 12/23/23  Pt will improve speech volume in 5 minutes simple conversation to 68dB with rare min A in 3 sessions  Baseline:  Goal status: INITIAL  2.  Pt will have completed SpeakOut warm up tasks (M-words, ah, glides, numbers) at least 6 days/week for 2 weeks after 10/23/23, as noted on practice tracker sheet Baseline:  Goal status: INITIAL  3.  Pt will follow precautions from MBS (if ordered) with modified independence in 3 sessions, after 10/23/23 Baseline:  Goal status: INITIAL  4.  Pt will demo in  3 sessions or (between 3 essions) report successful usage of memory compensations  Baseline:  Goal status: INITIAL    5. Pt will perform HEP for dysphagia with rare min cues in 3 sessions Baseline:  Goal status: INITIAL  ASSESSMENT:  CLINICAL IMPRESSION: Patient is a 78 y.o. M who was seen today for treatment swallowing in light of PD. See "Today's treatment" for more details. Formal cognitive communication assessment will be completed within the first 4 weeks and goals added PRN.  OBJECTIVE IMPAIRMENTS: Objective impairments include memory, executive functioning, receptive  language, dysarthria, and dysphagia. These impairments are limiting patient from household responsibilities, ADLs/IADLs, and effectively communicating at home and in community.Factors affecting potential to achieve goals and functional outcome are co-morbidities and medical prognosis.. Patient will benefit from skilled SLP services to address above impairments and improve overall function.  REHAB POTENTIAL: Good  PLAN:  SLP FREQUENCY: 1-2x/week  SLP DURATION: 8 weeks  PLANNED INTERVENTIONS: 92526 Treatment of swallowing function, 92507 Treatment of speech (30 or 45 min) , Aspiration precaution training, Pharyngeal strengthening exercises, Diet toleration management , Environmental controls, Trials of upgraded texture/liquids, Cognitive reorganization, Internal/external aids, Functional tasks, SLP instruction and feedback, Compensatory strategies, and Patient/family education    [facility], CCC-SLP 10/30/2023, 10:21 PM

## 2023-10-30 NOTE — Patient Instructions (Signed)
HERE is you swallowing exercise program:  SWALLOWING EXERCISES Do these 5-6 days/week for 8 weeks, then 2 days per week afterwards You can use a drop of two of liquid to help you swallow, if your mouth gets dry  Effortful Swallows - Swallow as hard as you can - Do at least 20 reps/day, in 2 sets of 10  Masako Swallow - swallow with your tongue sticking out - Stick tongue out past your lips and gently bite tongue with your teeth - Swallow, while holding your tongue with your teeth - Do at least 20 reps/day, in 2 sets of 10   Mendelsohn Maneuver - "squeeze swallow" exercise - Swallow, and squeeze tight for 5-7 seconds - Do at least 20 reps/day, in 2 sets of 10

## 2023-11-01 ENCOUNTER — Ambulatory Visit: Payer: Medicare Other

## 2023-11-07 ENCOUNTER — Ambulatory Visit: Payer: Medicare Other

## 2023-11-07 ENCOUNTER — Other Ambulatory Visit (HOSPITAL_BASED_OUTPATIENT_CLINIC_OR_DEPARTMENT_OTHER): Payer: Self-pay

## 2023-11-07 DIAGNOSIS — J385 Laryngeal spasm: Secondary | ICD-10-CM | POA: Diagnosis not present

## 2023-11-07 DIAGNOSIS — R471 Dysarthria and anarthria: Secondary | ICD-10-CM | POA: Diagnosis not present

## 2023-11-07 DIAGNOSIS — R490 Dysphonia: Secondary | ICD-10-CM | POA: Diagnosis not present

## 2023-11-07 DIAGNOSIS — R1312 Dysphagia, oropharyngeal phase: Secondary | ICD-10-CM | POA: Diagnosis not present

## 2023-11-07 DIAGNOSIS — R41841 Cognitive communication deficit: Secondary | ICD-10-CM

## 2023-11-07 NOTE — Therapy (Signed)
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   Allergic rhinitis    Dysphonia 08/27/2019   Gastroesophageal reflux disease 03/26/2019   History of malignant carcinoid tumor of large intestine    History of malignant carcinoid tumor of small intestine    Insomnia    Knee osteoarthritis    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   Male erectile dysfunction, unspecified    Parkinson's disease 12/09/2019   Rosacea, unspecified    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   Vocal fold atrophy    Past Surgical History:  Procedure Laterality Date   APPENDECTOMY     COLONOSCOPY  2004/2009/2015   INGUINAL HERNIA REPAIR Right    KNEE SURGERY     PARTIAL COLECTOMY     Patient Active Problem List   Diagnosis Date Noted   Mesenteric mass 07/08/2022   History of malignant carcinoid tumor 07/08/2022   Muscular deconditioning 01/30/2022   Acute metabolic encephalopathy 01/28/2022   History of malignant carcinoid tumor of small intestine 01/27/2022   Insomnia 01/27/2022   Mild cognitive impairment 01/27/2022   Abnormal CXR 01/27/2022   Abnormal CT scan 01/27/2022   Hyponatremia 01/27/2022   Transaminitis 01/27/2022   Parkinson's disease 12/09/2019   Allergies    Dysphonia 08/27/2019   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   Gastroesophageal reflux disease 03/26/2019   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    ONSET DATE:  Script dated 10/24/22  REFERRING DIAG:  G20.B2 (ICD-10-CM) - Parkinson's disease with dyskinesia and fluctuating manifestations (HCC)    THERAPY DIAG:  Dysarthria and anarthria  Cognitive communication deficit  Dysphagia, oropharyngeal phase  Rationale for Evaluation and Treatment: Rehabilitation  SUBJECTIVE:   SUBJECTIVE STATEMENT: "Isn't that the Mendelsohn?"  Pt accompanied by:  aide  Terri  PERTINENT HISTORY: PD, memory change (suspect mild/mod PDD), depression,RBD, RLS, and insomnia, low back pain  Had ST in Summer 2020 (voice based ST), and in Spring 2021 (SpeakOut!)  PAIN:  Are you having pain? No  FALLS: Has patient fallen in last 6 months? Yes. Number of falls 3  PATIENT GOALS: Improve effectiveness of speech  OBJECTIVE:  Note: Objective measures were completed at Evaluation unless otherwise noted.  DIAGNOSTIC FINDINGS:  MBS 08/24/23 Clinical Impression: Clinical Impression: Pt presents with mild oropharyngeal dysphagia c/b decreased lingual control to hold thin bolus during initial swallow, but within normal limits for subsequent swallows.  Pt with min disorganized chewing and short episode of lingual pumping with solids, but was able to fully masticate and clear bolus from oral cavity.  race residue noted and cleared with subsequent swallows.  Brisk tongue motion with timely swallow response throughout study.  Pt did exhibit partial anterior laryngeal elevation/partial epiglottic inversion resulting in incomplete airway closure and smaller volume of thin liquids (tsp amount) entering laryngeal vestibule above level of  the vocal cords.  Pt exhibited a weak cough, but withverbal cues, he was able to cough material out of the airway.  No other episodes of laryngeal penetration/aspiration noted during study.  Slight lingual residue d/t decreased BOT/posterior pharyngeal wall column residue noted, but cleared with either liquid wash or repetitive swallows. Esophageal  clearance good for all consistencies.  Pharyngeal stripping wave adequate.  Recommend continue outpatient ST to increase vocal quality/dysphagia tx for improved breath support for voice/cough strength to assist with /improving vocal intensity/quality and dysphagia tx/management.  Recommend continue current diet of Regular/thin liquids with repetitive swallows and liquid wash intermittently implemented during all po intake.  Thank you for this consult.   Factors that may increase risk of adverse event in presence of aspiration Rubye Oaks & Clearance Coots 2021): Factors that may increase risk of adverse event in presence of aspiration Rubye Oaks & Clearance Coots 2021): Poor general health and/or compromised immunity; Reduced cognitive function; Limited mobility; Frail or deconditioned; Inadequate oral hygiene; Weak cough   Recommendations/Plan: Swallowing Evaluation Recommendations Swallowing Evaluation Recommendations Recommendations: PO diet PO Diet Recommendation: Regular; Thin liquids (Level 0) Liquid Administration via: Cup Medication Administration: Whole meds with liquid Supervision: Patient able to self-feed; Intermittent supervision/cueing for swallowing strategies Swallowing strategies  : Slow rate; Small bites/sips; Multiple dry swallows after each bite/sip; Follow solids with liquids Postural changes: Position pt fully upright for meals Oral care recommendations: Oral care BID (2x/day)   PATIENT REPORTED OUTCOME MEASURES (PROM): Communication Effectiveness Survey: 11/32, with lower scores indicating greater impact of deficit on communicative effectiveness  (score taken from previous evaluation on 08/07/23)  TODAY'S TREATMENT:                                                                                                                                         DATE:  11/07/23: SWALLOW: HEP was completed with occasional min=mod A faded to rare min A for effortful and masako, and usual mod A faded to usual  min A with Mendelsohn.   SPEECH: M words (average 75dB), ah (average 78dB), and numbers (average 76 dB) with usual mod cues faded to occasional min-mod cues for these tasks. Pt is completing SPEAKOUT! video sessions somewhat regularly. SLP told pt to complete these three tasks (see "pt instructions") once daily.   10/30/23: SLP taught pt Clair Gulling and Masako. In addition to the effortful swallow, this will be pt's HEP to complete at home. Goal for HEP added.  Throughout session SLP had to ask pt to repeat speech due to hypophonia.  10/27/23: SLP worked with pt on following all precautions from [facility], with wife present. SLP reviewed precautions with examples, then provided POs for pt 90 seconds later. SLP had to cue for dry swallow after bite/sip and to alternate solids and liquids. SLP told Yordi Krager may need cueing initially at mealtimes to recall to use strategies, based upon this today. SLP cued pt for  extra swallow rarely after initial cue. He was independent with alternate bite/sip after initial cue. SLP also taught pt effortful swallow - will provide Mendelsohn, and Masako or chin tuck against resistance next session. Will also provide speech intelligiblity/loudness tx next session  10/25/23 (eval): SWALLOWING: SLP educated pt and aide about MBS results, and swallow precautions; pt was unsure if he was using any of precautions at home; aide said he was eating slowly. SLP will assess pt's use of precautions next session. Pt will need to perform HEP as directed for progress. SPEECH: SLP shaped pt's intentional voice with sentence completion. SLP educated pt and aide about intentional speech and how it improves loudness. SLP explained/educated aide on why intentional speech is necessary to improve effectiveness of communication. Pt will need to perform HEP as directed for progress  PATIENT EDUCATION: Education details: see "today's treatment" Person educated: Patient Education method: Explanation,  Demonstration, Verbal cues, and Handouts Education comprehension: verbalized understanding, returned demonstration, verbal cues required, and needs further education  HOME EXERCISE PROGRAM: Dysphagia HEP,speech exercises    GOALS: Goals reviewed with patient? Yes, in general  SHORT TERM GOALS: Target date: 11/25/23  Pt will demo volume of average 68dB in sentence responses with focus on increased intent and purpose in 3 sessions Baseline: Goal status: INITIAL  2.  Pt will improve speech volume in 2 minutes simple conversation to 68dB with rare min A in 2 sessions  Baseline:  Goal status: INITIAL  3.  Pt will complete at least 6 lessons of SpeakOut!/week for 2 weeks, as noted on practice tracker sheet Baseline:  Goal status: INITIAL  4.  Pt should undergo MBS, and then follow precautions from MBS (if ordered) with modified independence in 2 sessions Baseline:  Goal status: INITIAL  5. Pt will perform HEP for dysphagia with occasional min cues in 3 sessions Baseline:  Goal status: INITIAL  6.  Pt will undergo objective cognitive communication assessment in first 4 sessions (goals added PRN) Baseline:  Goal status: INITIAL   LONG TERM GOALS: Target date: 12/23/23  Pt will improve speech volume in 5 minutes simple conversation to 68dB with rare min A in 3 sessions  Baseline:  Goal status: INITIAL  2.  Pt will have completed SpeakOut warm up tasks (M-words, ah, glides, numbers) at least 6 days/week for 2 weeks after 10/23/23, as noted on practice tracker sheet Baseline:  Goal status: INITIAL  3.  Pt will follow precautions from MBS (if ordered) with modified independence in 3 sessions, after 10/23/23 Baseline:  Goal status: INITIAL  4.  Pt will demo in  3 sessions or (between 3 essions) report successful usage of memory compensations  Baseline:  Goal status: INITIAL    5. Pt will perform HEP for dysphagia with rare min cues in 3 sessions Baseline:  Goal status:  INITIAL  ASSESSMENT:  CLINICAL IMPRESSION: Patient is a 78 y.o. M who was seen today for treatment swallowing and speech deficits in light of PD. See "Today's treatment" for more details. Formal cognitive communication assessment will be completed within the first 4 weeks and goals added PRN.  OBJECTIVE IMPAIRMENTS: Objective impairments include memory, executive functioning, receptive language, dysarthria, and dysphagia. These impairments are limiting patient from household responsibilities, ADLs/IADLs, and effectively communicating at home and in community.Factors affecting potential to achieve goals and functional outcome are co-morbidities and medical prognosis.. Patient will benefit from skilled SLP services to address above impairments and improve overall function.  REHAB POTENTIAL: Good  PLAN:  SLP FREQUENCY: 1-2x/week  SLP DURATION: 8 weeks  PLANNED INTERVENTIONS: 92526 Treatment of swallowing function, 92507 Treatment of speech (30 or 45 min) , Aspiration precaution training, Pharyngeal strengthening exercises, Diet toleration management , Environmental controls, Trials of upgraded texture/liquids, Cognitive reorganization, Internal/external aids, Functional tasks, SLP instruction and feedback, Compensatory strategies, and Patient/family education    [facility] CCC-SLP 11/07/2023, 3:22 PM

## 2023-11-07 NOTE — Patient Instructions (Signed)
   SPEAKOUT! [facility] - ME- Odis Luster AHHHHHH X10 NUMBERS  1-2-3-4  5-6-7-8  06-19-10-12  13-14-15-16  17-18-19-20

## 2023-11-09 ENCOUNTER — Ambulatory Visit: Payer: Medicare Other

## 2023-11-09 DIAGNOSIS — J385 Laryngeal spasm: Secondary | ICD-10-CM | POA: Diagnosis not present

## 2023-11-09 DIAGNOSIS — R490 Dysphonia: Secondary | ICD-10-CM | POA: Diagnosis not present

## 2023-11-09 DIAGNOSIS — R41841 Cognitive communication deficit: Secondary | ICD-10-CM | POA: Diagnosis not present

## 2023-11-09 DIAGNOSIS — R1312 Dysphagia, oropharyngeal phase: Secondary | ICD-10-CM

## 2023-11-09 DIAGNOSIS — R471 Dysarthria and anarthria: Secondary | ICD-10-CM

## 2023-11-09 NOTE — Therapy (Signed)
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   Allergic rhinitis    Dysphonia 08/27/2019   Gastroesophageal reflux disease 03/26/2019   History of malignant carcinoid tumor of large intestine    History of malignant carcinoid tumor of small intestine    Insomnia    Knee osteoarthritis    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   Male erectile dysfunction, unspecified    Parkinson's disease 12/09/2019   Rosacea, unspecified    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   Vocal fold atrophy    Past Surgical History:  Procedure Laterality Date   APPENDECTOMY     COLONOSCOPY  2004/2009/2015   INGUINAL HERNIA REPAIR Right    KNEE SURGERY     PARTIAL COLECTOMY     Patient Active Problem List   Diagnosis Date Noted   Mesenteric mass 07/08/2022   History of malignant carcinoid tumor 07/08/2022   Muscular deconditioning 01/30/2022   Acute metabolic encephalopathy 01/28/2022   History of malignant carcinoid tumor of small intestine 01/27/2022   Insomnia 01/27/2022   Mild cognitive impairment 01/27/2022   Abnormal CXR 01/27/2022   Abnormal CT scan 01/27/2022   Hyponatremia 01/27/2022   Transaminitis 01/27/2022   Parkinson's disease 12/09/2019   Allergies    Dysphonia 08/27/2019   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   Gastroesophageal reflux disease 03/26/2019   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     ONSET DATE: Dx 2020, Script dated 10/24/22  REFERRING DIAG:  G20.B2 (ICD-10-CM) - Parkinson's disease with dyskinesia and fluctuating manifestations (HCC)    THERAPY DIAG:  Dysarthria and anarthria  Cognitive communication deficit  Dysphagia, oropharyngeal phase  Rationale for Evaluation and Treatment: Rehabilitation  SUBJECTIVE:   SUBJECTIVE STATEMENT: "Is there anything to do for that posture?"  Pt accompanied by: significant other [name]  PERTINENT HISTORY: PD, memory change (suspect mild/mod PDD), depression,RBD, RLS, and insomnia, low back pain  Had ST in Summer 2020 (voice based ST), and in Spring 2021 (SpeakOut!)  PAIN:  Are you having pain? No  FALLS: Has patient fallen in last 6 months? Yes. Number of falls 3  PATIENT GOALS: Improve effectiveness of speech  OBJECTIVE:  Note: Objective measures were completed at Evaluation unless otherwise noted.  DIAGNOSTIC FINDINGS:  MBS 08/24/23 Clinical Impression: Clinical Impression: Pt presents with mild oropharyngeal dysphagia c/b decreased lingual control to hold thin bolus during initial swallow, but within normal limits for subsequent swallows.  Pt with min disorganized chewing and short episode of lingual pumping with solids, but was able to fully masticate and clear bolus from oral cavity.  race residue noted and cleared with subsequent swallows.  Brisk tongue motion with timely swallow response throughout study.  Pt did exhibit partial anterior laryngeal elevation/partial epiglottic inversion resulting in incomplete airway closure and smaller volume of  thin liquids (tsp amount) entering laryngeal vestibule above level of the vocal cords.  Pt exhibited a weak cough, but withverbal cues, he was able to cough material out of the airway.  No other episodes of laryngeal penetration/aspiration noted during study.  Slight lingual residue d/t decreased BOT/posterior pharyngeal wall column residue noted, but cleared with either  liquid wash or repetitive swallows. Esophageal clearance good for all consistencies.  Pharyngeal stripping wave adequate.  Recommend continue outpatient ST to increase vocal quality/dysphagia tx for improved breath support for voice/cough strength to assist with /improving vocal intensity/quality and dysphagia tx/management.  Recommend continue current diet of Regular/thin liquids with repetitive swallows and liquid wash intermittently implemented during all po intake.  Thank you for this consult.   Factors that may increase risk of adverse event in presence of aspiration Rubye Oaks [employer] Clearance Coots 2021): Factors that may increase risk of adverse event in presence of aspiration Rubye Oaks & Clearance Coots 2021): Poor general health and/or compromised immunity; Reduced cognitive function; Limited mobility; Frail or deconditioned; Inadequate oral hygiene; Weak cough   Recommendations/Plan: Swallowing Evaluation Recommendations Swallowing Evaluation Recommendations Recommendations: PO diet PO Diet Recommendation: Regular; Thin liquids (Level 0) Liquid Administration via: Cup Medication Administration: Whole meds with liquid Supervision: Patient able to self-feed; Intermittent supervision/cueing for swallowing strategies Swallowing strategies  : Slow rate; Small bites/sips; Multiple dry swallows after each bite/sip; Follow solids with liquids Postural changes: Position pt fully upright for meals Oral care recommendations: Oral care BID (2x/day)   PATIENT REPORTED OUTCOME MEASURES (PROM): Communication Effectiveness Survey: 11/32, with lower scores indicating greater impact of deficit on communicative effectiveness  (score taken from previous evaluation on 08/07/23)  TODAY'S TREATMENT:                                                                                                                                         DATE:  11/09/23: SPEECH: SLP engaged pt and [name] with discussion about what [name] would like to do  with his speech therapy - Lisabeth Register, or [facility]!. He and SLP agreed a hybrid would be best for him to give him the best aspects for him of both treatments.  SWALLOW: SLP then reviewed pt's Mendelsohn with him based on his performance last session with this exercise. SLP guided pt procedure. He req'd min A occasionally faded to rarely. With Masako, pt req'd usual mod A for tongue protrusion, faded to rare min-mod A. SLP asked wife to assist with pt if necessary.  11/07/23: SWALLOW: HEP was completed with occasional min=mod A faded to rare min A for effortful and masako, and usual mod A faded to usual min A with Mendelsohn.   SPEECH: M words (average 75dB), ah (average 78dB), and numbers (average 76 dB) with usual mod cues faded to occasional min-mod cues for these tasks. Pt is completing SPEAKOUT! video sessions somewhat regularly. SLP told pt to complete these three tasks (see "  pt instructions") once daily.   10/30/23: SLP taught pt Clair Gulling and Masako. In addition to the effortful swallow, this will be pt's HEP to complete at home. Goal for HEP added.  Throughout session SLP had to ask pt to repeat speech due to hypophonia.  10/27/23: SLP worked with pt on following all precautions from [facility], with wife present. SLP reviewed precautions with examples, then provided POs for pt 90 seconds later. SLP had to cue for dry swallow after bite/sip and to alternate solids and liquids. SLP told Javarie Crisp may need cueing initially at mealtimes to recall to use strategies, based upon this today. SLP cued pt for extra swallow rarely after initial cue. He was independent with alternate bite/sip after initial cue. SLP also taught pt effortful swallow - will provide Mendelsohn, and Masako or chin tuck against resistance next session. Will also provide speech intelligiblity/loudness tx next session  10/25/23 (eval): SWALLOWING: SLP educated pt and aide about MBS results, and swallow precautions; pt was unsure if he  was using any of precautions at home; aide said he was eating slowly. SLP will assess pt's use of precautions next session. Pt will need to perform HEP as directed for progress. SPEECH: SLP shaped pt's intentional voice with sentence completion. SLP educated pt and aide about intentional speech and how it improves loudness. SLP explained/educated aide on why intentional speech is necessary to improve effectiveness of communication. Pt will need to perform HEP as directed for progress  PATIENT EDUCATION: Education details: see "today's treatment" Person educated: Patient Education method: Explanation, Demonstration, Verbal cues, and Handouts Education comprehension: verbalized understanding, returned demonstration, verbal cues required, and needs further education  HOME EXERCISE PROGRAM: Dysphagia HEP,speech exercises    GOALS: Goals reviewed with patient? Yes, in general  SHORT TERM GOALS: Target date: 11/25/23  Pt will demo volume of average 68dB in sentence responses with focus on increased intent and purpose in 3 sessions Baseline: Goal status: INITIAL  2.  Pt will improve speech volume in 2 minutes simple conversation to 68dB with rare min A in 2 sessions  Baseline:  Goal status: INITIAL  3.  Pt will complete at least 6 lessons of SpeakOut!/week for 2 weeks, as noted on practice tracker sheet Baseline:  Goal status: INITIAL  4.  Pt should undergo MBS, and then follow precautions from MBS (if ordered) with modified independence in 2 sessions Baseline:  Goal status: INITIAL  5. Pt will perform HEP for dysphagia with occasional min cues in 3 sessions Baseline:  Goal status: INITIAL  6.  Pt will undergo objective cognitive communication assessment in first 4 sessions (goals added PRN) Baseline:  Goal status: INITIAL   LONG TERM GOALS: Target date: 12/23/23  Pt will improve speech volume in 5 minutes simple conversation to 68dB with rare min A in 3 sessions  Baseline:   Goal status: INITIAL  2.  Pt will have completed SpeakOut warm up tasks (M-words, ah, glides, numbers) at least 6 days/week for 2 weeks after 10/23/23, as noted on practice tracker sheet Baseline:  Goal status: INITIAL  3.  Pt will follow precautions from MBS (if ordered) with modified independence in 3 sessions, after 10/23/23 Baseline:  Goal status: INITIAL  4.  Pt will demo in  3 sessions or (between 3 essions) report successful usage of memory compensations  Baseline:  Goal status: INITIAL    5. Pt will perform HEP for dysphagia with rare min cues in 3 sessions Baseline:  Goal status: INITIAL  ASSESSMENT:  CLINICAL IMPRESSION: Patient is a 78 y.o. M who was seen today for treatment swallowing and speech deficits in light of PD. See "Today's treatment" for more details. Formal cognitive communication assessment will be completed within the first 4 weeks and goals added PRN.  OBJECTIVE IMPAIRMENTS: Objective impairments include memory, executive functioning, receptive language, dysarthria, and dysphagia. These impairments are limiting patient from household responsibilities, ADLs/IADLs, and effectively communicating at home and in community.Factors affecting potential to achieve goals and functional outcome are co-morbidities and medical prognosis.. Patient will benefit from skilled SLP services to address above impairments and improve overall function.  REHAB POTENTIAL: Good  PLAN:  SLP FREQUENCY: 1-2x/week  SLP DURATION: 8 weeks  PLANNED INTERVENTIONS: 92526 Treatment of swallowing function, 92507 Treatment of speech (30 or 45 min) , Aspiration precaution training, Pharyngeal strengthening exercises, Diet toleration management , Environmental controls, Trials of upgraded texture/liquids, Cognitive reorganization, Internal/external aids, Functional tasks, SLP instruction and feedback, Compensatory strategies, and Patient/family education    [facility], CCC-SLP 11/09/2023,  11:13 AM

## 2023-11-14 ENCOUNTER — Ambulatory Visit: Payer: Medicare Other | Attending: Neurology

## 2023-11-14 DIAGNOSIS — R1312 Dysphagia, oropharyngeal phase: Secondary | ICD-10-CM | POA: Diagnosis not present

## 2023-11-14 DIAGNOSIS — R41841 Cognitive communication deficit: Secondary | ICD-10-CM | POA: Insufficient documentation

## 2023-11-14 DIAGNOSIS — R471 Dysarthria and anarthria: Secondary | ICD-10-CM | POA: Diagnosis not present

## 2023-11-14 NOTE — Therapy (Signed)
 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   Allergic rhinitis    Dysphonia 08/27/2019   Gastroesophageal reflux disease 03/26/2019   History of malignant carcinoid tumor of large intestine    History of malignant carcinoid tumor of small intestine    Insomnia    Knee osteoarthritis    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   Male erectile dysfunction, unspecified    Parkinson's disease 12/09/2019   Rosacea, unspecified    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   Vocal fold atrophy    Past Surgical History:  Procedure Laterality Date   APPENDECTOMY     COLONOSCOPY  2004/2009/2015   INGUINAL HERNIA REPAIR Right    KNEE SURGERY     PARTIAL COLECTOMY     Patient Active Problem List   Diagnosis Date Noted   Mesenteric mass 07/08/2022   History of malignant carcinoid tumor 07/08/2022   Muscular deconditioning 01/30/2022   Acute metabolic encephalopathy 01/28/2022   History of malignant carcinoid tumor of small intestine 01/27/2022   Insomnia 01/27/2022   Mild cognitive impairment 01/27/2022   Abnormal CXR 01/27/2022   Abnormal CT scan 01/27/2022   Hyponatremia 01/27/2022   Transaminitis 01/27/2022   Parkinson's disease 12/09/2019   Allergies    Dysphonia 08/27/2019   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  Pt accompanied by:  aide  Terri  PERTINENT HISTORY: PD, memory change (suspect mild/mod PDD), depression,RBD, RLS, and insomnia, low back pain  Had ST in Summer 2020 (voice based ST), and in Spring 2021 (SpeakOut!)  PAIN:  Are you having pain? No  FALLS: Has patient fallen in last 6 months? Yes. Number of falls 3  PATIENT GOALS: Improve effectiveness of speech  OBJECTIVE:  Note: Objective measures were completed at Evaluation unless otherwise noted.  DIAGNOSTIC FINDINGS:  MBS 08/24/23 Clinical Impression: Clinical Impression: Pt presents with mild oropharyngeal dysphagia c/b decreased lingual control to hold thin bolus during initial swallow, but within normal limits for subsequent swallows.  Pt with min disorganized chewing and short episode of lingual pumping with solids, but was able to fully masticate and clear bolus from oral cavity.  race residue noted and cleared with subsequent swallows.  Brisk tongue motion with timely swallow response throughout study.  Pt did exhibit partial anterior laryngeal elevation/partial epiglottic inversion resulting in incomplete airway closure and smaller volume of thin liquids (tsp  amount) entering laryngeal vestibule above level of the vocal cords.  Pt exhibited a weak cough, but withverbal cues, he was able to cough material out of the airway.  No other episodes of laryngeal penetration/aspiration noted during study.  Slight lingual residue d/t decreased BOT/posterior pharyngeal wall column residue noted, but cleared with either liquid wash or repetitive  swallows. Esophageal clearance good for all consistencies.  Pharyngeal stripping wave adequate.  Recommend continue outpatient ST to increase vocal quality/dysphagia tx for improved breath support for voice/cough strength to assist with /improving vocal intensity/quality and dysphagia tx/management.  Recommend continue current diet of Regular/thin liquids with repetitive swallows and liquid wash intermittently implemented during all po intake.  Thank you for this consult.   Factors that may increase risk of adverse event in presence of aspiration Noe & Lianne 2021): Factors that may increase risk of adverse event in presence of aspiration Noe & Lianne 2021): Poor general health and/or compromised immunity; Reduced cognitive function; Limited mobility; Frail or deconditioned; Inadequate oral hygiene; Weak cough   Recommendations/Plan: Swallowing Evaluation Recommendations Swallowing Evaluation Recommendations Recommendations: PO diet PO Diet Recommendation: Regular; Thin liquids (Level 0) Liquid Administration via: Cup Medication Administration: Whole meds with liquid Supervision: Patient able to self-feed; Intermittent supervision/cueing for swallowing strategies Swallowing strategies  : Slow rate; Small bites/sips; Multiple dry swallows after each bite/sip; Follow solids with liquids Postural changes: Position pt fully upright for meals Oral care recommendations: Oral care BID (2x/day)   PATIENT REPORTED OUTCOME MEASURES (PROM): Communication Effectiveness Survey: 11/32, with lower scores indicating greater impact of deficit on communicative effectiveness  (score taken from previous evaluation on 08/07/23)  TODAY'S TREATMENT:                                                                                                                                         DATE:  11/14/23: Cognitive eval next session. SWALLOW: SLP observed pt with POs today. Usual faded to occasional mod cues from  SLP to take smaller sips and not piecemeal swallows necessary. SLP explained rationale for pt and aide why this was important. P{t was independent with smaller bites and after initial cue for second/third swallow was independent with this as well. SLP suggested aide and wife sit with pt consistently in the next 7 days and remind pt of small sips and pt will ultimately remember to do this.   11/09/23: SPEECH: SLP engaged pt and Macario with discussion about what Rithy would like to do with his speech therapy - [facility], or [facility]!. He and SLP agreed a hybrid would be best for him to give him the best aspects for him of both treatments.  SWALLOW: SLP then reviewed pt's Mendelsohn with him based on his performance last session with this exercise. SLP guided pt procedure. He req'd min A occasionally faded to rarely. With Masako, pt req'd usual mod A for tongue protrusion, faded to  rare min-mod A. SLP asked wife to assist with pt if necessary.  11/07/23: SWALLOW: HEP was completed with occasional min=mod A faded to rare min A for effortful and masako, and usual mod A faded to usual min A with Mendelsohn.   SPEECH: M words (average 75dB), ah (average 78dB), and numbers (average 76 dB) with usual mod cues faded to occasional min-mod cues for these tasks. Pt is completing SPEAKOUT! video sessions somewhat regularly. SLP told pt to complete these three tasks (see pt instructions) once daily.   10/30/23: SLP taught pt Claudette and Masako. In addition to the effortful swallow, this will be pt's HEP to complete at home. Goal for HEP added.  Throughout session SLP had to ask pt to repeat speech due to hypophonia.  10/27/23: SLP worked with pt on following all precautions from MBS, with wife present. SLP reviewed precautions with examples, then provided POs for pt 90 seconds later. SLP had to cue for dry swallow after bite/sip and to alternate solids and liquids. SLP told Thatcher Doberstein may need cueing initially at  mealtimes to recall to use strategies, based upon this today. SLP cued pt for extra swallow rarely after initial cue. He was independent with alternate bite/sip after initial cue. SLP also taught pt effortful swallow - will provide Mendelsohn, and Masako or chin tuck against resistance next session. Will also provide speech intelligiblity/loudness tx next session  10/25/23 (eval): SWALLOWING: SLP educated pt and aide about MBS results, and swallow precautions; pt was unsure if he was using any of precautions at home; aide said he was eating slowly. SLP will assess pt's use of precautions next session. Pt will need to perform HEP as directed for progress. SPEECH: SLP shaped pt's intentional voice with sentence completion. SLP educated pt and aide about intentional speech and how it improves loudness. SLP explained/educated aide on why intentional speech is necessary to improve effectiveness of communication. Pt will need to perform HEP as directed for progress  PATIENT EDUCATION: Education details: see today's treatment Person educated: Patient Education method: Explanation, Demonstration, Verbal cues, and Handouts Education comprehension: verbalized understanding, returned demonstration, verbal cues required, and needs further education  HOME EXERCISE PROGRAM: Dysphagia HEP,speech exercises    GOALS: Goals reviewed with patient? Yes, in general  SHORT TERM GOALS: Target date: 11/25/23  Pt will demo volume of average 68dB in sentence responses with focus on increased intent and purpose in 3 sessions Baseline: Goal status: INITIAL  2.  Pt will improve speech volume in 2 minutes simple conversation to 68dB with rare min A in 2 sessions  Baseline:  Goal status: INITIAL  3.  Pt will complete at least 6 lessons/week of SpeakOut! Warmup tasks in 2 weeks, as noted on practice tracker sheet Baseline:  Goal status: modified  4.  Pt should undergo MBS, and then follow precautions from MBS (if  ordered) with modified independence in 2 sessions Baseline:  Goal status: met  5. Pt will perform HEP for dysphagia with occasional min cues in 3 sessions Baseline:  Goal status: INITIAL  6.  Pt will undergo objective cognitive communication assessment in first 5 sessions (goals added PRN) Baseline:  Goal status: modfied   LONG TERM GOALS: Target date: 12/23/23  Pt will improve speech volume in 5 minutes simple conversation to 68dB with rare min A in 3 sessions  Baseline:  Goal status: INITIAL  2.  Pt will have completed SpeakOut warm up tasks (M-words, ah, glides, numbers) at least 6 days/week for  2 weeks after 10/23/23, as noted on practice tracker sheet Baseline:  Goal status: INITIAL  3.  Pt will follow precautions from MBS (if ordered) with modified independence in 3 sessions, after 10/23/23 Baseline:  Goal status: INITIAL  4.  Pt will demo in  3 sessions or (between 3 essions) report successful usage of memory compensations  Baseline:  Goal status: INITIAL    5. Pt will perform HEP for dysphagia with rare min cues in 3 sessions Baseline:  Goal status: INITIAL  ASSESSMENT:  CLINICAL IMPRESSION: Modified STG #3, 6. Patient is a 78 y.o. M who was seen today for treatment swallowing and speech deficits in light of PD. See Today's treatment for more details. Formal cognitive communication assessment will be completed within the first 4 weeks and goals added PRN.  OBJECTIVE IMPAIRMENTS: Objective impairments include memory, executive functioning, receptive language, dysarthria, and dysphagia. These impairments are limiting patient from household responsibilities, ADLs/IADLs, and effectively communicating at home and in community.Factors affecting potential to achieve goals and functional outcome are co-morbidities and medical prognosis.. Patient will benefit from skilled SLP services to address above impairments and improve overall function.  REHAB POTENTIAL:  Good  PLAN:  SLP FREQUENCY: 1-2x/week  SLP DURATION: 8 weeks  PLANNED INTERVENTIONS: 92526 Treatment of swallowing function, 92507 Treatment of speech (30 or 45 min) , Aspiration precaution training, Pharyngeal strengthening exercises, Diet toleration management , Environmental controls, Trials of upgraded texture/liquids, Cognitive reorganization, Internal/external aids, Functional tasks, SLP instruction and feedback, Compensatory strategies, and Patient/family education    [facility], CCC-SLP 11/14/2023, 2:51 PM

## 2023-11-21 ENCOUNTER — Ambulatory Visit: Payer: Medicare Other

## 2023-11-21 DIAGNOSIS — R471 Dysarthria and anarthria: Secondary | ICD-10-CM

## 2023-11-21 DIAGNOSIS — R1312 Dysphagia, oropharyngeal phase: Secondary | ICD-10-CM | POA: Diagnosis not present

## 2023-11-21 DIAGNOSIS — R41841 Cognitive communication deficit: Secondary | ICD-10-CM | POA: Diagnosis not present

## 2023-11-21 NOTE — Patient Instructions (Signed)
   Memory Compensation Strategies You might already be using some of these.  Use "WARM" strategy. W= write it down A=  associate it R=  repeat it M=  make a mental picture  You can keep a Memory Notebook. Use a 3-ring notebook with sections for the following:  calendar, important names and phone numbers, medications, doctors' names/phone numbers, "to do list"/reminders, and a section to journal what you did each day  Use a calendar to write appointments down.  Write yourself a schedule for the day.  This can be placed on the calendar or in a separate section of the Memory Notebook.  Keeping a regular schedule can help memory.  Use medication organizer with sections for each day or morning/evening pills  You may need help loading it  Keep a basket, or pegboard by the door.   Place items that you need to take out with you in the basket or on the pegboard.  You may also want to include a message board for reminders.  Use sticky notes. Place sticky notes with reminders in a place where the task is performed.  For example:  "turn off the stove" placed by the stove, "lock the door" placed on the door at eye level, "take your medications" on the bathroom mirror or by the place where you normally take your medications  Use alarms, timers, and/or a reminder app. Use while cooking to remind yourself to check on food or as a reminder to take your medicine, or as a reminder to make a call, or as a reminder to perform another task, etc.  Use a voice recorder app or small tape recorder to record important information and notes for yourself. Go back at the end of the day and listen to these.

## 2023-11-21 NOTE — Therapy (Signed)
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   Allergic rhinitis    Dysphonia 08/27/2019   Gastroesophageal reflux disease 03/26/2019   History of malignant carcinoid tumor of large intestine    History of malignant carcinoid tumor of small intestine    Insomnia    Knee osteoarthritis    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   Male erectile dysfunction, unspecified    Parkinson's disease 12/09/2019   Rosacea, unspecified    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   Vocal fold atrophy    Past Surgical History:  Procedure Laterality Date   APPENDECTOMY     COLONOSCOPY  2004/2009/2015   INGUINAL HERNIA REPAIR Right    KNEE SURGERY     PARTIAL COLECTOMY     Patient Active Problem List   Diagnosis Date Noted   Mesenteric mass 07/08/2022   History of malignant carcinoid tumor 07/08/2022   Muscular deconditioning 01/30/2022   Acute metabolic encephalopathy 01/28/2022   History of malignant carcinoid tumor of small intestine 01/27/2022   Insomnia 01/27/2022   Mild cognitive impairment 01/27/2022   Abnormal CXR 01/27/2022   Abnormal CT scan 01/27/2022   Hyponatremia 01/27/2022   Transaminitis 01/27/2022   Parkinson's disease 12/09/2019   Allergies    Dysphonia 08/27/2019   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   Gastroesophageal reflux disease 03/26/2019   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    ONSET DATE:  Dx 2020, Script dated 10/24/22  REFERRING DIAG:  G20.B2 (ICD-10-CM) - Parkinson's disease with dyskinesia and fluctuating manifestations (HCC)    THERAPY DIAG:  Dysarthria and anarthria  Cognitive communication deficit  Dysphagia, oropharyngeal phase  Rationale for Evaluation and Treatment: Rehabilitation  SUBJECTIVE:   SUBJECTIVE STATEMENT: "I ate a whole meal without any liquids for lunch today."  Pt accompanied by:  aide  Terri  PERTINENT HISTORY: PD, memory change (suspect mild/mod PDD), depression,RBD, RLS, and insomnia, low back pain  Had ST in Summer 2020 (voice based ST), and in Spring 2021 (SpeakOut!)  PAIN:  Are you having pain? No  FALLS: Has patient fallen in last 6 months? Yes. Number of falls 3  PATIENT GOALS: Improve effectiveness of speech  OBJECTIVE:  Note: Objective measures were completed at Evaluation unless otherwise noted.  DIAGNOSTIC FINDINGS:  MBS 08/24/23 Clinical Impression: Clinical Impression: Pt presents with mild oropharyngeal dysphagia c/b decreased lingual control to hold thin bolus during initial swallow, but within normal limits for subsequent swallows.  Pt with min disorganized chewing and short episode of lingual pumping with solids, but was able to fully masticate and clear bolus from oral cavity.  Trace residue noted and cleared with subsequent swallows.  Brisk tongue motion with timely swallow response throughout study.  Pt did exhibit partial anterior laryngeal elevation/partial epiglottic inversion resulting in incomplete airway closure and smaller volume  of thin liquids (tsp amount) entering laryngeal vestibule above level of the vocal cords.  Pt exhibited a weak cough, but withverbal cues, he was able to cough material out of the airway.  No other episodes of laryngeal penetration/aspiration noted during study.  Slight lingual residue d/t decreased BOT/posterior pharyngeal wall column residue noted, but cleared with either liquid wash or  repetitive swallows. Esophageal clearance good for all consistencies.  Pharyngeal stripping wave adequate.  Recommend continue outpatient ST to increase vocal quality/dysphagia tx for improved breath support for voice/cough strength to assist with /improving vocal intensity/quality and dysphagia tx/management.  Recommend continue current diet of Regular/thin liquids with repetitive swallows and liquid wash intermittently implemented during all po intake.  Thank you for this consult.   Factors that may increase risk of adverse event in presence of aspiration Rubye Oaks [employer] Clearance Coots 2021): Factors that may increase risk of adverse event in presence of aspiration Rubye Oaks [employer] Clearance Coots 2021): Poor general health and/or compromised immunity; Reduced cognitive function; Limited mobility; Frail or deconditioned; Inadequate oral hygiene; Weak cough   Recommendations/Plan: Swallowing Evaluation Recommendations Swallowing Evaluation Recommendations Recommendations: PO diet PO Diet Recommendation: Regular; Thin liquids (Level 0) Liquid Administration via: Cup Medication Administration: Whole meds with liquid Supervision: Patient able to self-feed; Intermittent supervision/cueing for swallowing strategies Swallowing strategies  : Slow rate; Small bites/sips; Multiple dry swallows after each bite/sip; Follow solids with liquids Postural changes: Position pt fully upright for meals Oral care recommendations: Oral care BID (2x/day)   PATIENT REPORTED OUTCOME MEASURES (PROM): Communication Effectiveness Survey: 11/32, with lower scores indicating greater impact of deficit on communicative effectiveness  (score taken from previous evaluation on 08/07/23)  TODAY'S TREATMENT:                                                                                                                                         DATE:  11/21/23: Speech: Pt was then given the SLUMS, scoring as below. Main difficulty was slow processing, memory,  and on the clock drawing, Rayan demonstrated decr'd attention to detail and decr'd error awareness. 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 After SLUMS, SLP provided memory tips handout which will be reviewed next session with pt and caregiver (aide, and/or wife).  Swallow: Given "S", SLP worked with pt today on swallow strategies. SLP wrote out his swallow strategies and placed in front of him. Gery Pray req'd total A for small sips due to piecemealing the first two sips. After this, he remembered small sips x5 consecutive sips. He remembered to follow solids with liquids 100%. Consistently, long mastication process observed. No overt s/sx pharyngeal difficulty.   11/14/23: Cognitive eval next session. SWALLOW: SLP observed pt with POs today. Usual faded to occasional mod cues from SLP to take smaller sips and not piecemeal swallows necessary. SLP explained rationale for pt and aide why  this was important. P{t was independent with smaller bites and after initial cue for second/third swallow was independent with this as well. SLP suggested aide and wife sit with pt consistently in the next 7 days and remind pt of small sips and pt will ultimately remember to do this.   11/09/23: SPEECH: SLP engaged pt and [name] with discussion about what Tadarrius would like to do with his speech therapy - [facility], or [facility]!. He and SLP agreed a hybrid would be best for him to give him the best aspects for him of both treatments.  SWALLOW: SLP then reviewed pt's Mendelsohn with him based on his performance last session with this exercise. SLP guided pt procedure. He req'd min A occasionally faded to rarely. With Masako, pt req'd usual mod A for tongue protrusion, faded to rare min-mod A. SLP asked wife to assist with pt if necessary.  11/07/23: SWALLOW: HEP was completed with occasional min=mod A faded to rare min A for effortful and masako, and  usual mod A faded to usual min A with Mendelsohn.   SPEECH: M words (average 75dB), ah (average 78dB), and numbers (average 76 dB) with usual mod cues faded to occasional min-mod cues for these tasks. Pt is completing SPEAKOUT! video sessions somewhat regularly. SLP told pt to complete these three tasks (see "pt instructions") once daily.   10/30/23: SLP taught pt Clair Gulling and Masako. In addition to the effortful swallow, this will be pt's HEP to complete at home. Goal for HEP added.  Throughout session SLP had to ask pt to repeat speech due to hypophonia.  10/27/23: SLP worked with pt on following all precautions from [facility], with wife present. SLP reviewed precautions with examples, then provided POs for pt 90 seconds later. SLP had to cue for dry swallow after bite/sip and to alternate solids and liquids. SLP told Othniel Maret may need cueing initially at mealtimes to recall to use strategies, based upon this today. SLP cued pt for extra swallow rarely after initial cue. He was independent with alternate bite/sip after initial cue. SLP also taught pt effortful swallow - will provide Mendelsohn, and Masako or chin tuck against resistance next session. Will also provide speech intelligiblity/loudness tx next session  10/25/23 (eval): SWALLOWING: SLP educated pt and aide about MBS results, and swallow precautions; pt was unsure if he was using any of precautions at home; aide said he was eating slowly. SLP will assess pt's use of precautions next session. Pt will need to perform HEP as directed for progress. SPEECH: SLP shaped pt's intentional voice with sentence completion. SLP educated pt and aide about intentional speech and how it improves loudness. SLP explained/educated aide on why intentional speech is necessary to improve effectiveness of communication. Pt will need to perform HEP as directed for progress  PATIENT EDUCATION: Education details: see "today's treatment" Person educated:  Patient Education method: Explanation, Demonstration, Verbal cues, and Handouts Education comprehension: verbalized understanding, returned demonstration, verbal cues required, and needs further education  HOME EXERCISE PROGRAM: Dysphagia HEP,speech exercises    GOALS: Goals reviewed with patient? Yes, in general  SHORT TERM GOALS: Target date: 11/25/23  Pt will demo volume of average 68dB in sentence responses with focus on increased intent and purpose in 3 sessions Baseline: Goal status: INITIAL  2.  Pt will improve speech volume in 2 minutes simple conversation to 68dB with rare min A in 2 sessions  Baseline:  Goal status: INITIAL  3.  Pt will complete at least 6  lessons/week of SpeakOut! Warmup tasks in 2 weeks, as noted on practice tracker sheet Baseline:  Goal status: modified  4.  Pt should undergo MBS, and then follow precautions from MBS (if ordered) with modified independence in 2 sessions Baseline:  Goal status: met  5. Pt will perform HEP for dysphagia with occasional min cues in 3 sessions Baseline:  Goal status: INITIAL  6.  Pt will undergo objective cognitive communication assessment in first 5 sessions (goals added PRN) Baseline:  Goal status: met   LONG TERM GOALS: Target date: 12/23/23  Pt will improve speech volume in 5 minutes simple conversation to 68dB with rare min A in 3 sessions  Baseline:  Goal status: INITIAL  2.  Pt will have completed SpeakOut warm up tasks (M-words, ah, glides, numbers) at least 6 days/week for 2 weeks after 10/23/23, as noted on practice tracker sheet Baseline:  Goal status: INITIAL  3.  Pt will follow precautions from MBS (if ordered) with modified independence in 3 sessions, after 10/23/23 Baseline:  Goal status: INITIAL  4.  Pt will demo in  3 sessions or (between 3 essions) report successful usage of memory compensations  Baseline:  Goal status: INITIAL    5. Pt will perform HEP for dysphagia with rare min cues in  3 sessions Baseline:  Goal status: INITIAL  6.  Pt will report, and/or caregiver will report pt is using 2 memory strategies at home with assistance, between 3 appointments Baseline:  Goal status: INITIAL   ASSESSMENT:  CLINICAL IMPRESSION: SLP added LTG #6. Patient is a 78 y.o. M who was seen today for treatment swallowing and speech deficits in light of PD. See "Today's treatment" for more details. Today SLP had to provide initial cues for small sips with first 2 sips, but after this pt was independent.   OBJECTIVE IMPAIRMENTS: Objective impairments include memory, executive functioning, receptive language, dysarthria, and dysphagia. These impairments are limiting patient from household responsibilities, ADLs/IADLs, and effectively communicating at home and in community.Factors affecting potential to achieve goals and functional outcome are co-morbidities and medical prognosis.. Patient will benefit from skilled SLP services to address above impairments and improve overall function.  REHAB POTENTIAL: Good  PLAN:  SLP FREQUENCY: 1-2x/week  SLP DURATION: 8 weeks  PLANNED INTERVENTIONS: 92526 Treatment of swallowing function, 92507 Treatment of speech (30 or 45 min) , Aspiration precaution training, Pharyngeal strengthening exercises, Diet toleration management , Environmental controls, Trials of upgraded texture/liquids, Cognitive reorganization, Internal/external aids, Functional tasks, SLP instruction and feedback, Compensatory strategies, and Patient/family education    [facility], CCC-SLP 11/21/2023, 4:29 PM

## 2023-11-23 ENCOUNTER — Ambulatory Visit: Payer: Medicare Other

## 2023-11-23 DIAGNOSIS — R471 Dysarthria and anarthria: Secondary | ICD-10-CM

## 2023-11-23 DIAGNOSIS — R1312 Dysphagia, oropharyngeal phase: Secondary | ICD-10-CM | POA: Diagnosis not present

## 2023-11-23 DIAGNOSIS — R41841 Cognitive communication deficit: Secondary | ICD-10-CM

## 2023-11-23 NOTE — Therapy (Signed)
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   Allergic rhinitis    Dysphonia 08/27/2019   Gastroesophageal reflux disease 03/26/2019   History of malignant carcinoid tumor of large intestine    History of malignant carcinoid tumor of small intestine    Insomnia    Knee osteoarthritis    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   Male erectile dysfunction, unspecified    Parkinson's disease 12/09/2019   Rosacea, unspecified    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   Vocal fold atrophy    Past Surgical History:  Procedure Laterality Date   APPENDECTOMY     COLONOSCOPY  2004/2009/2015   INGUINAL HERNIA REPAIR Right    KNEE SURGERY     PARTIAL COLECTOMY     Patient Active Problem List   Diagnosis Date Noted   Mesenteric mass 07/08/2022   History of malignant carcinoid tumor 07/08/2022   Muscular deconditioning 01/30/2022   Acute metabolic encephalopathy 01/28/2022   History of malignant carcinoid tumor of small intestine 01/27/2022   Insomnia 01/27/2022   Mild cognitive impairment 01/27/2022   Abnormal CXR 01/27/2022   Abnormal CT scan 01/27/2022   Hyponatremia 01/27/2022   Transaminitis 01/27/2022   Parkinson's disease 12/09/2019   Allergies    Dysphonia 08/27/2019   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   Pt accompanied by:  aide  Terri  PERTINENT HISTORY: PD, memory change (suspect mild/mod PDD), depression,RBD, RLS, and insomnia, low back pain  Had ST in Summer 2020 (voice based ST), and in Spring 2021 (SpeakOut!)  PAIN:  Are you having pain? Yes: NPRS scale: 3/10 Pain location: lt ear Pain description: sore Aggravating factors: nothing known Relieving factors: nothing known  Yes: NPRS scale: 3/10 Pain location: lt hip Pain description: sore Aggravating factors: nothing known Relieving factors: nothing known  FALLS: Has patient fallen in last 6 months? Yes. Number of falls 4  PATIENT GOALS: Improve effectiveness of speech  OBJECTIVE:  Note: Objective measures were completed at Evaluation unless otherwise noted.  DIAGNOSTIC FINDINGS:  MBS 08/24/23 Clinical Impression: Clinical Impression: Pt presents with mild oropharyngeal dysphagia c/b decreased lingual control to hold thin bolus during initial swallow, but within normal limits for subsequent swallows.  Pt with min disorganized chewing and short episode of lingual pumping with solids, but was able to fully masticate and clear bolus from oral cavity.  Trace residue  noted and cleared with subsequent swallows.  Brisk tongue motion with timely swallow response throughout study.  Pt did exhibit partial anterior laryngeal elevation/partial epiglottic inversion resulting in incomplete airway closure and smaller volume of thin liquids (tsp amount) entering laryngeal vestibule above level of the vocal cords.  Pt exhibited a weak cough, but withverbal  cues, he was able to cough material out of the airway.  No other episodes of laryngeal penetration/aspiration noted during study.  Slight lingual residue d/t decreased BOT/posterior pharyngeal wall column residue noted, but cleared with either liquid wash or repetitive swallows. Esophageal clearance good for all consistencies.  Pharyngeal stripping wave adequate.  Recommend continue outpatient ST to increase vocal quality/dysphagia tx for improved breath support for voice/cough strength to assist with /improving vocal intensity/quality and dysphagia tx/management.  Recommend continue current diet of Regular/thin liquids with repetitive swallows and liquid wash intermittently implemented during all po intake.  Thank you for this consult.   Factors that may increase risk of adverse event in presence of aspiration Rubye Oaks & Clearance Coots 2021): Factors that may increase risk of adverse event in presence of aspiration Rubye Oaks & Clearance Coots 2021): Poor general health and/or compromised immunity; Reduced cognitive function; Limited mobility; Frail or deconditioned; Inadequate oral hygiene; Weak cough   Recommendations/Plan: Swallowing Evaluation Recommendations Swallowing Evaluation Recommendations Recommendations: PO diet PO Diet Recommendation: Regular; Thin liquids (Level 0) Liquid Administration via: Cup Medication Administration: Whole meds with liquid Supervision: Patient able to self-feed; Intermittent supervision/cueing for swallowing strategies Swallowing strategies  : Slow rate; Small bites/sips; Multiple dry swallows after each bite/sip; Follow solids with liquids Postural changes: Position pt fully upright for meals Oral care recommendations: Oral care BID (2x/day)   PATIENT REPORTED OUTCOME MEASURES (PROM): Communication Effectiveness Survey: 11/32, with lower scores indicating greater impact of deficit on communicative effectiveness  (score taken from previous evaluation on 08/07/23)  TODAY'S  TREATMENT:                                                                                                                                         DATE:  11/23/23:  Speech: SLP reviewed memory tips handout for pt, and pt/wife named a few tips that might be salient for him. Calendar, schedule were both indicated by [name] as likely very helpful. SLP encouraged wife and pt to sit down each dinner and look at calendar for review for next day, write down a schedule for pt and [name] to review the next morning to foster encoding and recall. Suggested they put the schedule on the refrigerator. SLP reviewed pt's SpeakOut voice warmups - pt req'd SLP cue initially for M words and numbers for more intent. [name] stated she and pt will sometimes recite together.   Wife, [name], pulled SLP aside before session, and told SLP that pt's confusion has incr'd recently; Pt needs more reminders and cues during the day. SLP suggested to contact Dr. Arbutus Leas.  11/21/23: Speech: Pt was then given the SLUMS, scoring as below. Main difficulty was slow processing, memory, and on the clock drawing, Juaquin demonstrated decr'd attention to detail and decr'd error awareness. Overall score was 19/30, placing his score in the "dementia" range (more significant than a neurocognitive disorder). After SLUMS, SLP provided memory tips handout which will be mild reviewed next session with pt and caregiver (aide, and/or wife).  Swallow: Given "S", SLP worked with pt today on swallow strategies. SLP wrote out his swallow strategies and placed in front of him. Gery Pray req'd total A for small sips due to piecemealing the first two sips. After this, he remembered small sips x5 consecutive sips. He remembered to follow solids with liquids 100%. Consistently, long mastication process observed. No overt s/sx pharyngeal difficulty.   11/14/23: Cognitive eval next session. SWALLOW: SLP observed pt with POs today. Usual faded to occasional mod cues from SLP to take  smaller sips and not piecemeal swallows necessary. SLP explained rationale for pt and aide why this was important. P{t was independent with smaller bites and after initial cue for second/third swallow was independent with this as well. SLP suggested aide and wife sit with pt consistently in the next 7 days and remind pt of small sips and pt will ultimately remember to do this.   11/09/23: SPEECH: SLP engaged pt and [name] with discussion about what Magnus would like to do with his speech therapy - [facility], or [facility]!. He and SLP agreed a hybrid would be best for him to give him the best aspects for him of both treatments.  SWALLOW: SLP then reviewed pt's Mendelsohn with him based on his performance last session with this exercise. SLP guided pt procedure. He req'd min A occasionally faded to rarely. With Masako, pt req'd usual mod A for tongue protrusion, faded to rare min-mod A. SLP asked wife to assist with pt if necessary.  11/07/23: SWALLOW: HEP was completed with occasional min=mod A faded to rare min A for effortful and masako, and usual mod A faded to usual min A with Mendelsohn.   SPEECH: M words (average 75dB), ah (average 78dB), and numbers (average 76 dB) with usual mod cues faded to occasional min-mod cues for these tasks. Pt is completing SPEAKOUT! video sessions somewhat regularly. SLP told pt to complete these three tasks (see "pt instructions") once daily.   10/30/23: SLP taught pt Clair Gulling and Masako. In addition to the effortful swallow, this will be pt's HEP to complete at home. Goal for HEP added.  Throughout session SLP had to ask pt to repeat speech due to hypophonia.  10/27/23: SLP worked with pt on following all precautions from [facility], with wife present. SLP reviewed precautions with examples, then provided POs for pt 90 seconds later. SLP had to cue for dry swallow after bite/sip and to alternate solids and liquids. SLP told Jibril Mcminn may need cueing initially at mealtimes to  recall to use strategies, based upon this today. SLP cued pt for extra swallow rarely after initial cue. He was independent with alternate bite/sip after initial cue. SLP also taught pt effortful swallow - will provide Mendelsohn, and Masako or chin tuck against resistance next session. Will also provide speech intelligiblity/loudness tx next session  10/25/23 (eval): SWALLOWING: SLP educated pt and aide about MBS results, and swallow precautions; pt was unsure if he was using any of precautions at home; aide said he was eating slowly. SLP will assess pt's use of precautions next session. Pt  will need to perform HEP as directed for progress. SPEECH: SLP shaped pt's intentional voice with sentence completion. SLP educated pt and aide about intentional speech and how it improves loudness. SLP explained/educated aide on why intentional speech is necessary to improve effectiveness of communication. Pt will need to perform HEP as directed for progress  PATIENT EDUCATION: Education details: see "today's treatment" Person educated: Patient Education method: Explanation, Demonstration, Verbal cues, and Handouts Education comprehension: verbalized understanding, returned demonstration, verbal cues required, and needs further education  HOME EXERCISE PROGRAM: Dysphagia HEP,speech exercises    GOALS: Goals reviewed with patient? Yes, in general  SHORT TERM GOALS: Target date: 11/25/23  Pt will demo volume of average 68dB in sentence responses with focus on increased intent and purpose in 3 sessions Baseline: Goal status: INITIAL  2.  Pt will improve speech volume in 2 minutes simple conversation to 68dB with rare min A in 2 sessions  Baseline:  Goal status: not met  3.  Pt will complete at least 6 lessons/week of SpeakOut! Warmup tasks in 2 weeks, as noted on practice tracker sheet Baseline:  Goal status: not met  4.  Pt should undergo MBS, and then follow precautions from MBS (if ordered) with  modified independence in 2 sessions Baseline:  Goal status: met  5. Pt will perform HEP for dysphagia with occasional min cues in 3 sessions Baseline:  Goal status: met  6.  Pt will undergo objective cognitive communication assessment in first 5 sessions (goals added PRN) Baseline:  Goal status: met   LONG TERM GOALS: Target date: 12/23/23  Pt will improve speech volume in 5 minutes simple conversation to 68dB with rare min A in 3 sessions  Baseline:  Goal status: INITIAL  2.  Pt will have completed SpeakOut warm up tasks (M-words, ah, glides, numbers) at least 6 days/week for 2 weeks after 10/23/23, as noted on practice tracker sheet Baseline:  Goal status: INITIAL  3.  Pt will follow precautions from MBS (if ordered) with modified independence in 3 sessions, after 10/23/23 Baseline:  Goal status: INITIAL  4.  Pt will demo in  3 sessions or (between 3 essions) report successful usage of memory compensations  Baseline:  Goal status: INITIAL    5. Pt will perform HEP for dysphagia with rare min cues in 3 sessions Baseline:  Goal status: INITIAL  6.  Pt will report, and/or caregiver will report pt is using 2 memory strategies at home with assistance, between 3 appointments Baseline:  Goal status: INITIAL   ASSESSMENT:  CLINICAL IMPRESSION: Patient is a 78 y.o. M who was seen today for treatment swallowing and speech deficits in light of PD. See "Today's treatment" for more details. Wife states pt's confusion has been more prevalent recently, prior to fall yesterday.   OBJECTIVE IMPAIRMENTS: Objective impairments include memory, executive functioning, receptive language, dysarthria, and dysphagia. These impairments are limiting patient from household responsibilities, ADLs/IADLs, and effectively communicating at home and in community.Factors affecting potential to achieve goals and functional outcome are co-morbidities and medical prognosis.. Patient will benefit from skilled  SLP services to address above impairments and improve overall function.  REHAB POTENTIAL: Good  PLAN:  SLP FREQUENCY: 1-2x/week  SLP DURATION: 8 weeks  PLANNED INTERVENTIONS: 92526 Treatment of swallowing function, 92507 Treatment of speech (30 or 45 min) , Aspiration precaution training, Pharyngeal strengthening exercises, Diet toleration management , Environmental controls, Trials of upgraded texture/liquids, Cognitive reorganization, Internal/external aids, Functional tasks, SLP instruction and feedback, Compensatory strategies, and  Patient/family education    [place], CCC-SLP 11/23/2023, 2:47 PM

## 2023-11-29 ENCOUNTER — Ambulatory Visit: Payer: Medicare Other

## 2023-12-06 ENCOUNTER — Ambulatory Visit: Payer: Medicare Other

## 2023-12-06 ENCOUNTER — Other Ambulatory Visit: Payer: Self-pay | Admitting: Neurology

## 2023-12-06 DIAGNOSIS — R1312 Dysphagia, oropharyngeal phase: Secondary | ICD-10-CM

## 2023-12-06 DIAGNOSIS — R471 Dysarthria and anarthria: Secondary | ICD-10-CM

## 2023-12-06 DIAGNOSIS — R41841 Cognitive communication deficit: Secondary | ICD-10-CM | POA: Diagnosis not present

## 2023-12-06 NOTE — Patient Instructions (Signed)
 SWALLOWING EXERCISES Do these 5-6 days/week for 8 weeks, then 2 days per week afterwards You can use a drop of two of liquid to help you swallow, if your mouth gets dry   Effortful Swallows - Swallow as hard as you can - Do at least 20 reps/day, in 2 sets of 10   Masako Swallow - swallow with your tongue sticking out - Stick tongue out past your lips and gently bite tongue with your teeth - Swallow, while holding your tongue with your teeth - Do at least 20 reps/day, in 2 sets of 10    Mendelsohn Maneuver - "squeeze swallow" exercise - Swallow, and squeeze tight for 5-7 seconds - Do at least 20 reps/day, in 2 sets of 10

## 2023-12-06 NOTE — Therapy (Signed)
 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   Allergic rhinitis    Dysphonia 08/27/2019   Gastroesophageal reflux disease 03/26/2019   History of malignant carcinoid tumor of large intestine    History of malignant carcinoid tumor of small intestine    Insomnia    Knee osteoarthritis    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   Male erectile dysfunction, unspecified    Parkinson's disease 12/09/2019   Rosacea, unspecified    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   Vocal fold atrophy    Past Surgical History:  Procedure Laterality Date   APPENDECTOMY     COLONOSCOPY  2004/2009/2015   INGUINAL HERNIA REPAIR Right    KNEE SURGERY     PARTIAL COLECTOMY     Patient Active Problem List   Diagnosis Date Noted   Mesenteric mass 07/08/2022   History of malignant carcinoid tumor 07/08/2022   Muscular deconditioning 01/30/2022   Acute metabolic encephalopathy 01/28/2022   History of malignant carcinoid tumor of small intestine 01/27/2022   Insomnia 01/27/2022   Mild cognitive impairment 01/27/2022   Abnormal CXR 01/27/2022   Abnormal CT scan 01/27/2022   Hyponatremia 01/27/2022   Transaminitis 01/27/2022   Parkinson's disease 12/09/2019   Allergies    Dysphonia 08/27/2019   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 Ended up on all fours on the grass next to his driveway.  Pt accompanied by:  aide  [name]  PERTINENT HISTORY: PD, memory change (suspect mild/mod PDD), depression,RBD, RLS, and insomnia, low back pain  Had ST in Summer 2020 (voice based ST), and in Spring 2021 (SpeakOut!)  PAIN:  Are you having pain? No  FALLS: Has patient fallen in last 6 months? Yes. Number of falls 4  PATIENT GOALS: Improve effectiveness of speech  OBJECTIVE:  Note: Objective measures were completed at Evaluation unless otherwise noted.  DIAGNOSTIC FINDINGS:  MBS 08/24/23 Clinical Impression: Clinical Impression: Pt presents with mild oropharyngeal dysphagia c/b decreased lingual control to hold thin bolus during initial swallow, but within normal limits for subsequent swallows.  Pt with min disorganized chewing and short episode of lingual pumping with solids, but was able to fully masticate and clear bolus from oral cavity.  Trace residue noted and cleared with subsequent swallows.  Brisk tongue motion with timely swallow response throughout study.  Pt did exhibit partial anterior laryngeal elevation/partial epiglottic inversion resulting in incomplete airway closure and smaller volume of thin liquids (tsp amount) entering laryngeal vestibule above level of the vocal cords.  Pt exhibited a weak cough, but withverbal cues, he was able to cough material out of the airway.  No other episodes of laryngeal penetration/aspiration noted during study.  Slight lingual residue d/t decreased  BOT/posterior pharyngeal wall column residue noted, but cleared with either liquid wash or repetitive swallows. Esophageal clearance good for all consistencies.  Pharyngeal stripping wave adequate.  Recommend continue outpatient ST to increase vocal quality/dysphagia tx for improved breath support for voice/cough strength to assist with /improving vocal intensity/quality and dysphagia tx/management.  Recommend continue current diet of Regular/thin liquids with repetitive swallows and liquid wash intermittently implemented during all po intake.  Thank you for this consult.   Factors that may increase risk of adverse event in presence of aspiration Rubye Oaks & Clearance Coots 2021): Factors that may increase risk of adverse event in presence of aspiration Rubye Oaks & Clearance Coots 2021): Poor general health and/or compromised immunity; Reduced cognitive function; Limited mobility; Frail or deconditioned; Inadequate oral hygiene; Weak cough   Recommendations/Plan: Swallowing Evaluation Recommendations Swallowing Evaluation Recommendations Recommendations: PO diet PO Diet Recommendation: Regular; Thin liquids (Level 0) Liquid Administration via: Cup Medication Administration: Whole meds with liquid Supervision: Patient able to self-feed; Intermittent supervision/cueing for swallowing strategies Swallowing strategies  : Slow rate; Small bites/sips; Multiple dry swallows after each bite/sip; Follow solids with liquids Postural changes: Position pt fully upright for meals Oral care recommendations: Oral care BID (2x/day)   PATIENT REPORTED OUTCOME MEASURES (PROM): Communication Effectiveness Survey: 11/32, with lower scores indicating greater impact of deficit on communicative effectiveness  (score taken from previous evaluation on 08/07/23)  TODAY'S TREATMENT:                                                                                                                                         DATE:  12/06/23: SpeechAurther Loft with idea to get pt a calendar that would tell the date and pt could better keep track of date this way - SLP encouraged this. Terry let SLP know that pt has never been a person with a louder voice or someone to raise his voice. SLP had to ask pt for repeats x6 today due to hypophonia. Pt incr'd loudness 3/6 after one request, and 2/6  with two requests - by a third request pt was able to raise loudness on the remaining incident. Swallow: SLP reviewed pt's HEP with him. Pt req'd occasional min A faded to modified independent by task completion. SLP encouraged [name] to complete with pt or encourage pt to complete on the 3 days she is there, and for [name] to assist pt (at least) two more days/week. SLP explained/reminded pt of at least 5 days/week completion would be optimal, for 8 weeks, then x2/days week  after that.  Today pt was independent with procedure for HEP. He was noted to decr strength of swallow after 4 reps of Masako, and 6 reps of effortful - longer trigger time and reduced thyroid elevation.  11/23/23:  Speech: SLP reviewed memory tips handout for pt, and pt/wife named a few tips that might be salient for him. Calendar, schedule were both indicated by [name] as likely very helpful. SLP encouraged wife and pt to sit down each dinner and look at calendar for review for next day, write down a schedule for pt and [name] to review the next morning to foster encoding and recall. Suggested they put the schedule on the refrigerator. SLP reviewed pt's SpeakOut voice warmups - pt req'd SLP cue initially for M words and numbers for more intent. [name] stated she and pt will sometimes recite together.   Wife, [name], pulled SLP aside before session, and told SLP that pt's confusion has incr'd recently; Pt needs more reminders and cues during the day. SLP suggested to contact Dr. Arbutus Leas.   11/21/23: Speech: Pt was then given the SLUMS, scoring as below. Main difficulty was slow processing, memory, and on the clock  drawing, Renel demonstrated decr'd attention to detail and decr'd error awareness. Overall score was 19/30, placing his score in the "dementia" range (more significant than a neurocognitive disorder). After SLUMS, SLP provided memory tips handout which will be mild reviewed next session with pt and caregiver (aide, and/or wife).  Swallow: Given "S", SLP worked with pt today on swallow strategies. SLP wrote out his swallow strategies and placed in front of him. Gery Pray req'd total A for small sips due to piecemealing the first two sips. After this, he remembered small sips x5 consecutive sips. He remembered to follow solids with liquids 100%. Consistently, long mastication process observed. No overt s/sx pharyngeal difficulty.   11/14/23: Cognitive eval next session. SWALLOW: SLP observed pt with POs today. Usual faded to occasional mod cues from SLP to take smaller sips and not piecemeal swallows necessary. SLP explained rationale for pt and aide why this was important. P{t was independent with smaller bites and after initial cue for second/third swallow was independent with this as well. SLP suggested aide and wife sit with pt consistently in the next 7 days and remind pt of small sips and pt will ultimately remember to do this.   11/09/23: SPEECH: SLP engaged pt and [name] with discussion about what Makih would like to do with his speech therapy - [facility], or [facility] He and SLP agreed a hybrid would be best for him to give him the best aspects for him of both treatments.  SWALLOW: SLP then reviewed pt's Mendelsohn with him based on his performance last session with this exercise. SLP guided pt procedure. He req'd min A occasionally faded to rarely. With Masako, pt req'd usual mod A for tongue protrusion, faded to rare min-mod A. SLP asked wife to assist with pt if necessary.  11/07/23: SWALLOW: HEP was completed with occasional min=mod A faded to rare min A for effortful and masako, and usual mod A  faded to usual min A with Mendelsohn.   SPEECH: M words (average 75dB), ah (average 78dB), and numbers (average 76 dB) with usual mod cues faded to occasional min-mod cues for these tasks. Pt is completing SPEAKOUT! video sessions somewhat regularly. SLP told pt to complete these three tasks (see "pt instructions") once daily.   10/30/23: SLP taught pt Clair Gulling and Masako. In addition to the  effortful swallow, this will be pt's HEP to complete at home. Goal for HEP added.  Throughout session SLP had to ask pt to repeat speech due to hypophonia.  10/27/23: SLP worked with pt on following all precautions from [facility], with wife present. SLP reviewed precautions with examples, then provided POs for pt 90 seconds later. SLP had to cue for dry swallow after bite/sip and to alternate solids and liquids. SLP told Jevan Gaunt may need cueing initially at mealtimes to recall to use strategies, based upon this today. SLP cued pt for extra swallow rarely after initial cue. He was independent with alternate bite/sip after initial cue. SLP also taught pt effortful swallow - will provide Mendelsohn, and Masako or chin tuck against resistance next session. Will also provide speech intelligiblity/loudness tx next session  10/25/23 (eval): SWALLOWING: SLP educated pt and aide about MBS results, and swallow precautions; pt was unsure if he was using any of precautions at home; aide said he was eating slowly. SLP will assess pt's use of precautions next session. Pt will need to perform HEP as directed for progress. SPEECH: SLP shaped pt's intentional voice with sentence completion. SLP educated pt and aide about intentional speech and how it improves loudness. SLP explained/educated aide on why intentional speech is necessary to improve effectiveness of communication. Pt will need to perform HEP as directed for progress  PATIENT EDUCATION: Education details: see "today's treatment" Person educated: Patient Education method:  Explanation, Demonstration, Verbal cues, and Handouts Education comprehension: verbalized understanding, returned demonstration, verbal cues required, and needs further education  HOME EXERCISE PROGRAM: Dysphagia HEP,speech exercises    GOALS: Goals reviewed with patient? Yes, in general  SHORT TERM GOALS: Target date: 11/25/23  Pt will demo volume of average 68dB in sentence responses with focus on increased intent and purpose in 3 sessions Baseline: Goal status: not met  2.  Pt will improve speech volume in 2 minutes simple conversation to 68dB with rare min A in 2 sessions  Baseline:  Goal status: not met  3.  Pt will complete at least 6 lessons/week of SpeakOut! Warmup tasks in 2 weeks, as noted on practice tracker sheet Baseline:  Goal status: not met  4.  Pt should undergo MBS, and then follow precautions from MBS (if ordered) with modified independence in 2 sessions Baseline:  Goal status: met  5. Pt will perform HEP for dysphagia with occasional min cues in 3 sessions Baseline:  Goal status: met  6.  Pt will undergo objective cognitive communication assessment in first 5 sessions (goals added PRN) Baseline:  Goal status: met   LONG TERM GOALS: Target date: 12/23/23  Pt will improve speech volume in 5 minutes simple conversation to 68dB with rare min A in 3 sessions  Baseline:  Goal status: INITIAL  2.  Pt will have completed SpeakOut warm up tasks (M-words, ah, glides, numbers) at least 6 days/week for 2 weeks after 10/23/23, as noted on practice tracker sheet Baseline:  Goal status: INITIAL  3.  Pt will follow precautions from MBS (if ordered) with modified independence in 3 sessions, after 10/23/23 Baseline:  Goal status: INITIAL  4.  Pt will demo in  3 sessions or (between 3 essions) report successful usage of memory compensations  Baseline:  Goal status: INITIAL    5. Pt will perform HEP for dysphagia with rare min cues in 3 sessions Baseline:  12/06/23 Goal status: INITIAL  6.  Pt will report, and/or caregiver will report pt is using  2 memory strategies at home with assistance, between 3 appointments Baseline:  Goal status: INITIAL   ASSESSMENT:  CLINICAL IMPRESSION: Patient is a 78 y.o. M who was seen today for treatment swallowing and speech deficits in light of PD. See "Today's treatment" for more details. SLP reviewed pt's HEP for dysphagia today. He had a controlled fall getting into car today- ended up on all fours in his grass.  OBJECTIVE IMPAIRMENTS: Objective impairments include memory, executive functioning, receptive language, dysarthria, and dysphagia. These impairments are limiting patient from household responsibilities, ADLs/IADLs, and effectively communicating at home and in community.Factors affecting potential to achieve goals and functional outcome are co-morbidities and medical prognosis.. Patient will benefit from skilled SLP services to address above impairments and improve overall function.  REHAB POTENTIAL: Good  PLAN:  SLP FREQUENCY: 1-2x/week  SLP DURATION: 8 weeks  PLANNED INTERVENTIONS: 92526 Treatment of swallowing function, 92507 Treatment of speech (30 or 45 min) , Aspiration precaution training, Pharyngeal strengthening exercises, Diet toleration management , Environmental controls, Trials of upgraded texture/liquids, Cognitive reorganization, Internal/external aids, Functional tasks, SLP instruction and feedback, Compensatory strategies, and Patient/family education    [facility], CCC-SLP 12/06/2023, 5:34 PM

## 2023-12-07 ENCOUNTER — Other Ambulatory Visit (HOSPITAL_BASED_OUTPATIENT_CLINIC_OR_DEPARTMENT_OTHER): Payer: Self-pay

## 2023-12-07 MED ORDER — CLONAZEPAM 0.5 MG PO TABS
0.2500 mg | ORAL_TABLET | Freq: Every day | ORAL | 0 refills | Status: DC
  Filled 2023-12-07: qty 45, 90d supply, fill #0

## 2023-12-13 ENCOUNTER — Ambulatory Visit: Payer: Medicare Other | Attending: Neurology

## 2023-12-13 DIAGNOSIS — R1312 Dysphagia, oropharyngeal phase: Secondary | ICD-10-CM | POA: Diagnosis not present

## 2023-12-13 DIAGNOSIS — R471 Dysarthria and anarthria: Secondary | ICD-10-CM | POA: Insufficient documentation

## 2023-12-13 DIAGNOSIS — R41841 Cognitive communication deficit: Secondary | ICD-10-CM | POA: Diagnosis not present

## 2023-12-13 NOTE — Patient Instructions (Signed)
   Try a consistent cue for talking louder (between [employer] and [place]) - Be intentional!  - Project!  - Classroom voice!

## 2023-12-13 NOTE — Therapy (Signed)
 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   Allergic rhinitis    Dysphonia 08/27/2019   Gastroesophageal reflux disease 03/26/2019   History of malignant carcinoid tumor of large intestine    History of malignant carcinoid tumor of small intestine    Insomnia    Knee osteoarthritis    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   Male erectile dysfunction, unspecified    Parkinson's disease 12/09/2019   Rosacea, unspecified    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   Vocal fold atrophy    Past Surgical History:  Procedure Laterality Date   APPENDECTOMY     COLONOSCOPY  2004/2009/2015   INGUINAL HERNIA REPAIR Right    KNEE SURGERY     PARTIAL COLECTOMY     Patient Active Problem List   Diagnosis Date Noted   Mesenteric mass 07/08/2022   History of malignant carcinoid tumor 07/08/2022   Muscular deconditioning 01/30/2022   Acute metabolic encephalopathy 01/28/2022   History of malignant carcinoid tumor of small intestine 01/27/2022   Insomnia 01/27/2022   Mild cognitive impairment 01/27/2022   Abnormal CXR 01/27/2022   Abnormal CT scan 01/27/2022   Hyponatremia 01/27/2022   Transaminitis 01/27/2022   Parkinson's disease 12/09/2019   Allergies    Dysphonia 08/27/2019   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 Pt cont to consistently require verbal cues for improved loudness at home from wife and caregiver despite listening to Barry Pope multiple times per week and HEP from SLP. Wife reported pt appears to be more frequently confused than initially during this therapy course.  Today SLP suggested having a consistent cue for pt to incr loudness between wife and caregiver.   Remaining deficits: Dysarthria, dysphagia, cognitive communication.   Education / Equipment: See "treatment date" for more details   Patient agrees to discharge. Patient goals were partially met. Patient is being discharged due to the patient's request..     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 No  FALLS: Has patient fallen in last 6 months? Yes. Number of falls 4  PATIENT GOALS: Improve effectiveness of speech  OBJECTIVE:  Note: Objective measures were completed at Evaluation unless otherwise noted.  DIAGNOSTIC FINDINGS:  MBS 08/24/23 Clinical Impression: Clinical Impression: Pt presents with mild oropharyngeal dysphagia c/b decreased lingual control  to hold thin bolus during initial swallow, but within normal limits for subsequent swallows.  Pt with min disorganized chewing and short episode of lingual pumping with solids, but was able to fully masticate and clear bolus from oral cavity.  Trace residue noted and cleared with subsequent swallows.  Brisk tongue motion with timely swallow response throughout study.  Pt did exhibit partial anterior laryngeal elevation/partial epiglottic inversion resulting in incomplete airway closure and smaller volume of thin liquids (tsp amount) entering laryngeal vestibule above level of the vocal cords.  Pt exhibited a weak cough, but withverbal cues, he was able to cough material out of the airway.  No other episodes of laryngeal penetration/aspiration noted during study.  Slight lingual residue d/t decreased BOT/posterior pharyngeal wall column residue noted, but cleared with either liquid wash or repetitive swallows. Esophageal Barry good for all consistencies.  Pharyngeal stripping wave adequate.  Recommend continue outpatient ST to increase vocal quality/dysphagia tx for improved breath support for voice/cough strength to assist with /improving vocal intensity/quality and dysphagia tx/management.  Recommend continue current diet of Regular/thin liquids with repetitive swallows and liquid wash intermittently implemented during all po intake.  Thank you for this consult.   Factors that may increase risk of adverse event in presence of aspiration Barry Pope & Barry Pope 2021): Factors that may increase risk of adverse event in presence of aspiration Barry Pope & Barry Pope 2021): Poor general health and/or compromised immunity; Reduced cognitive function; Limited mobility; Frail or deconditioned; Inadequate oral hygiene; Weak cough   Recommendations/Plan: Swallowing Evaluation Recommendations Swallowing Evaluation Recommendations Recommendations: PO diet PO Diet Recommendation: Regular; Thin liquids (Level 0) Liquid  Administration via: Cup Medication Administration: Whole meds with liquid Supervision: Patient able to self-feed; Intermittent supervision/cueing for swallowing strategies Swallowing strategies  : Slow rate; Small bites/sips; Multiple dry swallows after each bite/sip; Follow solids with liquids Postural changes: Position pt fully upright for meals Oral care recommendations: Oral care BID (2x/day)   PATIENT REPORTED OUTCOME MEASURES (PROM): Communication Effectiveness Survey: 11/32, with lower scores indicating greater impact of deficit on communicative effectiveness  (score taken from previous evaluation on 08/07/23)  TODAY'S TREATMENT:                                                                                                                                         DATE:  12/13/23: Speech: Barry Pope says she asks pt to repeat 85% of the time, and when done, pt increases his volume <50% of the time.SLP educated Barry Pope that pt  may need a consistent cue from spouse and caregiver for louder speech. SLP encouraged Barry Pope and Barry Pope to communicate between themselves which cue works best for pt to improve loudness. SLP made suggestions for this. Pt spoke with louder volume after explanation from SLP about pt needing to think he is shouting to produce louder/intelligible speech.  Swallow: SLP observed pt with chips and water. Pt req'd initial min-mod cues for additional swallows for solids, faded to rare min A. Barry Pope reminded pt appropriately about second swallow during this time. Barry Pope states she is there for pt's mid-day meal 3 days a week and assists pt's recall for safe swallowing.  12/06/23: Speech: Barry Pope with idea to get pt a calendar that would tell the date and pt could better keep track of date this way - SLP encouraged this. Terry let SLP know that pt has never been a person with a louder voice or someone to raise his voice. SLP had to ask pt for repeats x6 today due to hypophonia. Pt incr'd loudness 3/6  after one request, and 2/6  with two requests - by a third request pt was able to raise loudness on the remaining incident. Swallow: SLP reviewed pt's HEP with him. Pt req'd occasional min A faded to modified independent by task completion. SLP encouraged Barry Pope to complete with pt or encourage pt to complete on the 3 days she is there, and for Barry Pope to assist pt (at least) two more days/week. SLP explained/reminded pt of at least 5 days/week completion would be optimal, for 8 weeks, then x2/days week after that.  Today pt was independent with procedure for HEP. He was noted to decr strength of swallow after 4 reps of Masako, and 6 reps of effortful - longer trigger time and reduced thyroid elevation.  11/23/23:  Speech: SLP reviewed memory tips handout for pt, and pt/wife named a few tips that might be salient for him. Calendar, schedule were both indicated by Barry Pope as likely very helpful. SLP encouraged wife and pt to sit down each dinner and look at calendar for review for next day, write down a schedule for pt and Barry Pope to review the next morning to foster encoding and recall. Suggested they put the schedule on the refrigerator. SLP reviewed pt's SpeakOut voice warmups - pt req'd SLP cue initially for M words and numbers for more intent. Barry Pope stated she and pt will sometimes recite together.   Wife, Barry Pope, pulled SLP aside before session, and told SLP that pt's confusion has incr'd recently; Pt needs more reminders and cues during the day. SLP suggested to contact Dr. Arbutus Leas.   11/21/23: Speech: Pt was then given the SLUMS, scoring as below. Main difficulty was slow processing, memory, and on the clock drawing, Hondo demonstrated decr'd attention to detail and decr'd error awareness. Overall score was 19/30, placing his score in the "dementia" range (more significant than a neurocognitive disorder). After SLUMS, SLP provided memory tips handout which will be mild reviewed next session with pt and caregiver (aide,  and/or wife).  Swallow: Given "S", SLP worked with pt today on swallow strategies. SLP wrote out his swallow strategies and placed in front of him. Gery Pray req'd total A for small sips due to piecemealing the first two sips. After this, he remembered small sips x5 consecutive sips. He remembered to follow solids with liquids 100%. Consistently, long mastication process observed. No overt s/sx pharyngeal difficulty.   11/14/23: Cognitive eval next session. SWALLOW: SLP observed pt with POs today. Usual  faded to occasional mod cues from SLP to take smaller sips and not piecemeal swallows necessary. SLP explained rationale for pt and aide why this was important. P{t was independent with smaller bites and after initial cue for second/third swallow was independent with this as well. SLP suggested aide and wife sit with pt consistently in the next 7 days and remind pt of small sips and pt will ultimately remember to do this.   11/09/23: SPEECH: SLP engaged pt and Barry Pope with discussion about what [place] would like to do with his speech therapy - [facility], or [facility] He and SLP agreed a hybrid would be best for him to give him the best aspects for him of both treatments.  SWALLOW: SLP then reviewed pt's Mendelsohn with him based on his performance last session with this exercise. SLP guided pt procedure. He req'd min A occasionally faded to rarely. With Masako, pt req'd usual mod A for tongue protrusion, faded to rare min-mod A. SLP asked wife to assist with pt if necessary.  11/07/23: SWALLOW: HEP was completed with occasional min=mod A faded to rare min A for effortful and masako, and usual mod A faded to usual min A with Mendelsohn.   SPEECH: M words (average 75dB), ah (average 78dB), and numbers (average 76 dB) with usual mod cues faded to occasional min-mod cues for these tasks. Pt is completing SPEAKOUT! video sessions somewhat regularly. SLP told pt to complete these three tasks (see "pt instructions")  once daily.   10/30/23: SLP taught pt Clair Gulling and Masako. In addition to the effortful swallow, this will be pt's HEP to complete at home. Goal for HEP added.  Throughout session SLP had to ask pt to repeat speech due to hypophonia.  10/27/23: SLP worked with pt on following all precautions from [facility], with wife present. SLP reviewed precautions with examples, then provided POs for pt 90 seconds later. SLP had to cue for dry swallow after bite/sip and to alternate solids and liquids. SLP told Javaris Wigington may need cueing initially at mealtimes to recall to use strategies, based upon this today. SLP cued pt for extra swallow rarely after initial cue. He was independent with alternate bite/sip after initial cue. SLP also taught pt effortful swallow - will provide Mendelsohn, and Masako or chin tuck against resistance next session. Will also provide speech intelligiblity/loudness tx next session  10/25/23 (eval): SWALLOWING: SLP educated pt and aide about MBS results, and swallow precautions; pt was unsure if he was using any of precautions at home; aide said he was eating slowly. SLP will assess pt's use of precautions next session. Pt will need to perform HEP as directed for progress. SPEECH: SLP shaped pt's intentional voice with sentence completion. SLP educated pt and aide about intentional speech and how it improves loudness. SLP explained/educated aide on why intentional speech is necessary to improve effectiveness of communication. Pt will need to perform HEP as directed for progress  PATIENT EDUCATION: Education details: see "today's treatment" Person educated: Patient Education method: Explanation, Demonstration, Verbal cues, and Handouts Education comprehension: verbalized understanding, returned demonstration, verbal cues required, and needs further education  HOME EXERCISE PROGRAM: Dysphagia HEP,speech exercises    GOALS: Goals reviewed with patient? Yes, in general  SHORT TERM GOALS:  Target date: 11/25/23  Pt will demo volume of average 68dB in sentence responses with focus on increased intent and purpose in 3 sessions Baseline: Goal status: not met  2.  Pt will improve speech volume in 2 minutes simple  conversation to 68dB with rare min A in 2 sessions  Baseline:  Goal status: not met  3.  Pt will complete at least 6 lessons/week of SpeakOut! Warmup tasks in 2 weeks, as noted on practice tracker sheet Baseline:  Goal status: not met  4.  Pt should undergo MBS, and then follow precautions from MBS (if ordered) with modified independence in 2 sessions Baseline:  Goal status: met  5. Pt will perform HEP for dysphagia with occasional min cues in 3 sessions Baseline:  Goal status: met  6.  Pt will undergo objective cognitive communication assessment in first 5 sessions (goals added PRN) Baseline:  Goal status: met   LONG TERM GOALS: Target date: 12/23/23  Pt will improve speech volume in 5 minutes simple conversation to 68dB with rare min A in 3 sessions  Baseline:  Goal status: not met  2.  Pt will have completed SpeakOut warm up tasks (M-words, ah, glides, numbers) at least 6 days/week for 2 weeks after 10/23/23, as noted on practice tracker sheet Baseline:  Goal status: partially met  3.  Pt will follow precautions from MBS (if ordered) with modified independence in 3 sessions, after 10/23/23 Baseline:  Goal status: not met  4.  Pt will demo in  3 sessions or (between 3 essions) report successful usage of memory compensations  Baseline:  Goal status: not met    5. Pt will perform HEP for dysphagia with rare min cues in 3 sessions Baseline: 12/06/23 Goal status: partially met  6.  Pt will report, and/or caregiver will report pt is using 2 memory strategies at home with assistance, between 3 appointments Baseline:  Goal status: not met   ASSESSMENT:  CLINICAL IMPRESSION: Patient is a 78 y.o. M who was seen today for treatment swallowing and speech  deficits in light of PD. Pt's goals not met largely due to premature (requested) d/c. See "Today's treatment" for more details. SLP suggested scheduling x2 more visits every other week, but pt's wife and pt think d/c may be best at this time. Pt will be seen in August for screenings from OT, PT and ST.   OBJECTIVE IMPAIRMENTS: Objective impairments include memory, executive functioning, receptive language, dysarthria, and dysphagia. These impairments are limiting patient from household responsibilities, ADLs/IADLs, and effectively communicating at home and in community.Factors affecting potential to achieve goals and functional outcome are co-morbidities and medical prognosis.. Patient will benefit from skilled SLP services to address above impairments and improve overall function.  REHAB POTENTIAL: Good  PLAN: Discharge today   PLANNED INTERVENTIONS: 92526 Treatment of swallowing function, 92507 Treatment of speech (30 or 45 min) , Aspiration precaution training, Pharyngeal strengthening exercises, Diet toleration management , Environmental controls, Trials of upgraded texture/liquids, Cognitive reorganization, Internal/external aids, Functional tasks, SLP instruction and feedback, Compensatory strategies, and Patient/family education    [facility], CCC-SLP 12/13/2023, 2:54 PM

## 2023-12-18 ENCOUNTER — Other Ambulatory Visit (HOSPITAL_BASED_OUTPATIENT_CLINIC_OR_DEPARTMENT_OTHER): Payer: Self-pay

## 2023-12-19 ENCOUNTER — Other Ambulatory Visit (HOSPITAL_BASED_OUTPATIENT_CLINIC_OR_DEPARTMENT_OTHER): Payer: Self-pay

## 2023-12-20 ENCOUNTER — Other Ambulatory Visit (HOSPITAL_BASED_OUTPATIENT_CLINIC_OR_DEPARTMENT_OTHER): Payer: Self-pay

## 2023-12-20 ENCOUNTER — Other Ambulatory Visit: Payer: Self-pay

## 2023-12-20 DIAGNOSIS — Z0000 Encounter for general adult medical examination without abnormal findings: Secondary | ICD-10-CM | POA: Diagnosis not present

## 2023-12-20 DIAGNOSIS — Z79899 Other long term (current) drug therapy: Secondary | ICD-10-CM | POA: Diagnosis not present

## 2023-12-20 DIAGNOSIS — E538 Deficiency of other specified B group vitamins: Secondary | ICD-10-CM | POA: Diagnosis not present

## 2023-12-20 DIAGNOSIS — D696 Thrombocytopenia, unspecified: Secondary | ICD-10-CM | POA: Diagnosis not present

## 2023-12-20 DIAGNOSIS — R7309 Other abnormal glucose: Secondary | ICD-10-CM | POA: Diagnosis not present

## 2023-12-20 DIAGNOSIS — G20A1 Parkinson's disease without dyskinesia, without mention of fluctuations: Secondary | ICD-10-CM | POA: Diagnosis not present

## 2023-12-20 DIAGNOSIS — Z23 Encounter for immunization: Secondary | ICD-10-CM | POA: Diagnosis not present

## 2023-12-20 MED ORDER — ALPRAZOLAM 0.5 MG PO TABS
0.5000 mg | ORAL_TABLET | Freq: Two times a day (BID) | ORAL | 1 refills | Status: DC | PRN
  Filled 2023-12-20: qty 30, 15d supply, fill #0
  Filled 2024-03-15: qty 30, 15d supply, fill #1

## 2023-12-20 MED ORDER — DOXYCYCLINE HYCLATE 50 MG PO CAPS
50.0000 mg | ORAL_CAPSULE | Freq: Every day | ORAL | 0 refills | Status: DC
  Filled 2023-12-20: qty 90, 90d supply, fill #0

## 2023-12-21 ENCOUNTER — Other Ambulatory Visit (HOSPITAL_BASED_OUTPATIENT_CLINIC_OR_DEPARTMENT_OTHER): Payer: Self-pay

## 2023-12-29 ENCOUNTER — Other Ambulatory Visit (HOSPITAL_BASED_OUTPATIENT_CLINIC_OR_DEPARTMENT_OTHER): Payer: Self-pay

## 2023-12-29 MED ORDER — SERTRALINE HCL 100 MG PO TABS
100.0000 mg | ORAL_TABLET | Freq: Every day | ORAL | 1 refills | Status: AC
  Filled 2023-12-29: qty 90, 90d supply, fill #0
  Filled 2024-02-08 – 2024-03-28 (×2): qty 90, 90d supply, fill #1

## 2024-01-01 NOTE — Progress Notes (Unsigned)
 Assessment/Plan:   1.  Parkinsons Disease  -His genetic testing was negative.  -increase rytary 245 mg, 1 capsule 4 times per day, 1 at 7am/11am/4pm/bedtime.  Move last dose about 30 min to 1 hour prior to bed to assist with dressing at bed.  Samples given  -We discussed that it used to be thought that levodopa would increase risk of melanoma but now it is believed that Parkinsons itself likely increases risk of melanoma. he is to get regular skin checks.  He is following regularly with dermatology.   2.  Memory change, now with PDD  -Neurocognitive testing in April, 2021 was completely normal in April, 2021, but suspect may have some mild PDD with time.  Discussed again neurocognitive testing, but ultimately they decided to hold on that.    -discussed caregiving resources, including [facility] and encouraged them to utilize them and increase home caregiving.  3.  Depression  -On sertraline, 100 mg daily.  As above, may be making RBD worse and to discuss with prescribing physician  4.   RBD, RLS and insomnia  -sertraline could make RBD a bit worse.  We discussed this again today as RBD is worse.  He, however, may need the sertraline for mood  -used to follow with [facility] but doesn't now  -discussed with patient that clonazepam and alprazolam cannot be used together.  Alprazolam was just filled by primary care on March 12.  I will leave prescribing to primary care.  -On melatonin, 10 mg nightly.  5.  B12 deficiency  -On supplementation  6.  RLS  -Now on bedtime Rytary  7.  Low back pain/lumbar radiculopathy  -has gotten injections in past from Dr. Alvester Morin but didn't find helpful.   Subjective:   Barry Pope was seen today in follow up.  My previous records were reviewed prior to todays visit.  We switched him to Rytary last visit.  His wife called a few weeks later and stated that he was doing much better in terms of facial dyskinesia as well as sleep.   At the beginning of the year, however, she called back and stated that the medication was very expensive and he was having facial dyskinesia.  We did call them back and stated that the price was likely higher at the beginning of the year due to needing to meet the deductible.  We told them that it facial dyskinesia was not bothersome to the patient, we did not want to alter medication at that time.  Patient has since been to physical and speech therapy.  Notes are reviewed.  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  He filled this regularly, last being filled for 90-day supply February 27.  However, just recent filled a prescription for alprazolam from his primary care.  He has had 3-4 falls since last visit, the last one being about 3 weeks ago and he fell and hit the dishwasher and hit his ear.  He didn't seek assistance for this one.  He does have a caregiver 3 days/week.  Wife notes progression with movement and memory both.  He doesn't use a walker/cane.  He has a cane but PT told him not helpful.  They plan to get a rollator.  He is needing assistance with dressing, esp at night.  Having some paranoia - was watching news - and was paranoid all the news is AI  and his book reading is AI.  Wife states that his tone was a little aggressive.     Current prescribed movement disorder medications: Rytary 195 mg, 1 four times per day (7 AM/11 AM/4 PM/bedtime) -added last visit B12  PREVIOUS MEDICATIONS: Amantadine (stopped when he was in the hospital for confusion, but he had a lot of reasons to have confusion at that time, including hyponatremia, significantly elevated liver enzymes, fever and ultimately dx with ebv hepatitis); carbidopa/levodopa 25/100 IR -more facial dyskinesia and confusion  ALLERGIES:  No Known Allergies  CURRENT MEDICATIONS:  Outpatient Encounter Medications as of 01/02/2024  Medication Sig   acyclovir  (ZOVIRAX) 200 MG capsule 1 capusle Orally four times a day as needed   ALPRAZolam (XANAX) 0.5 MG tablet Take 1 tablet (0.5 mg total) by mouth 2 (two) times daily as needed for panic.   Carbidopa-Levodopa ER (RYTARY) 61.25-245 MG CPCR Take 1 capsule by mouth 4 (four) times daily.   Cholecalciferol (VITAMIN D-3) 25 MCG (1000 UT) CAPS 1 capsule Orally Once a day for 30 day(s)   clonazePAM (KLONOPIN) 0.5 MG tablet Take 0.5 tablets (0.25 mg total) by mouth at bedtime.   Cyanocobalamin 1000 MCG TBCR 1 tablet Orally Once a day   doxycycline (VIBRAMYCIN) 50 MG capsule Take 1 capsule (50 mg total) by mouth daily for rosacea.   ibuprofen (ADVIL) 200 MG tablet Take 400 mg by mouth every 6 (six) hours as needed for mild pain.   sertraline (ZOLOFT) 100 MG tablet Take 1 tablet (100 mg total) by mouth daily.   tamsulosin (FLOMAX) 0.4 MG CAPS capsule Take 1 capsule (0.4 mg total) by mouth every evening  for urinary frequency.   [DISCONTINUED] Carbidopa-Levodopa ER (RYTARY) 48.75-195 MG CPCR Take 1 tablet by mouth 4 (four) times daily at 7am/11am/4pm/bedtime   COVID-19 mRNA vaccine 2023-2024 (COMIRNATY) syringe Inject into the muscle.   [DISCONTINUED] traZODone (DESYREL) 50 MG tablet Take 50 mg by mouth at bedtime. (Patient not taking: Reported on 01/02/2024)   [DISCONTINUED] traZODone (DESYREL) 50 MG tablet Take 0.5-1 tablets (25-50 mg total) by mouth at bedtime as needed. (Patient not taking: Reported on 08/02/2023)   No facility-administered encounter medications on file as of 01/02/2024.    Objective:   PHYSICAL EXAMINATION:    VITALS:   Vitals:   01/02/24 1253  BP: 124/86  Pulse: (!) 58  SpO2: 99%  Weight: 158 lb (71.7 kg)     GEN:  The patient appears stated age and is in NAD. HEENT:  Normocephalic, atraumatic.  The mucous membranes are moist. The superficial temporal arteries are without ropiness or tenderness. CV:  brady.  Regular Lungs:  ctab   Neurological examination:  Orientation:  The patient is alert and oriented x3 but he is slow to react and looks to wife for finer aspects Cranial nerves: There is good facial symmetry with mild facial hypomimia. The speech is fluent and clear.  He is hypophonic.  Soft palate rises symmetrically and there is no tongue deviation. Hearing is decreased to conversational tone. Sensation: Sensation is intact to light touch throughout Motor: Strength is 5/5 in the UE/LE  Movement examination: Tone: There is mild increased tone in the RUE Abnormal movements:  There is no dyskinesia or tremor today Coordination:  There is no decremation today Gait and Station: The patient has no difficulty arising out of a deep-seated chair without the use of the hands. The patient is forward flexed with mild festination  I have reviewed and interpreted the  following labs independently   Lab Results  Component Value Date   TSH 3.609 01/28/2022   Lab Results  Component Value Date   VITAMINB12 216 06/15/2021   Total time spent on today's visit was 43 minutes, including both face-to-face time and nonface-to-face time.  Time included that spent on review of records (prior notes available to me/labs/imaging if pertinent), discussing treatment and goals, answering patient's questions and coordinating care.    Cc:  [name], MD

## 2024-01-02 ENCOUNTER — Ambulatory Visit: Payer: Medicare Other | Admitting: Neurology

## 2024-01-02 ENCOUNTER — Encounter: Payer: Self-pay | Admitting: Neurology

## 2024-01-02 VITALS — BP 124/86 | HR 58 | Wt 158.0 lb

## 2024-01-02 DIAGNOSIS — G20B2 Parkinson's disease with dyskinesia, with fluctuations: Secondary | ICD-10-CM | POA: Diagnosis not present

## 2024-01-02 DIAGNOSIS — F02818 Dementia in other diseases classified elsewhere, unspecified severity, with other behavioral disturbance: Secondary | ICD-10-CM

## 2024-01-02 DIAGNOSIS — G20A1 Parkinson's disease without dyskinesia, without mention of fluctuations: Secondary | ICD-10-CM | POA: Diagnosis not present

## 2024-01-02 MED ORDER — RYTARY 61.25-245 MG PO CPCR: 1.0000 | ORAL_CAPSULE | Freq: Four times a day (QID) | ORAL | Status: DC

## 2024-01-02 NOTE — Patient Instructions (Addendum)
 We discussed a nonprofit medical company called [employer].  They have all kinds of medical equipment including wheelchairs, walkers, canes and incontinence materials, which is free of charge.  Their phone number is [phone].  Their email is [email or web address].  Feel free to reach out to them with questions.  I believe that they are only open for pick up on Tuesdays but you can contact them other days of the week.  Increase rytary to the 245 mg and take the last one 30 min - 1 hour prior to bed

## 2024-02-08 ENCOUNTER — Other Ambulatory Visit (HOSPITAL_COMMUNITY): Payer: Self-pay

## 2024-02-13 ENCOUNTER — Other Ambulatory Visit (HOSPITAL_BASED_OUTPATIENT_CLINIC_OR_DEPARTMENT_OTHER): Payer: Self-pay

## 2024-02-13 MED ORDER — TAMSULOSIN HCL 0.4 MG PO CAPS
0.4000 mg | ORAL_CAPSULE | Freq: Every evening | ORAL | 4 refills | Status: DC
  Filled 2024-02-13: qty 30, 30d supply, fill #0
  Filled 2024-03-15: qty 30, 30d supply, fill #1
  Filled 2024-04-18: qty 30, 30d supply, fill #2

## 2024-02-16 ENCOUNTER — Telehealth: Payer: Self-pay | Admitting: Neurology

## 2024-02-16 NOTE — Telephone Encounter (Signed)
 Pt is beginning to wonder at night and is very agitated, would like to discus taking pill of rx  or changing dosage of ClonazePAM  please call wife thanks

## 2024-02-16 NOTE — Telephone Encounter (Signed)
 Called patients wife and patient just growing more agitated and is pacing and not able to rest at night. Patient is up all night and doesn't seem to know the difference in morning or night . Patients wife is also getting no rest. She is asking if Dr. Winferd Hatter will up Clonazepam  from .05 to .10

## 2024-02-19 NOTE — Telephone Encounter (Signed)
 Called patients wife and she is reaching out to Dr. Bernetta Brilliant about the xanax 

## 2024-02-23 ENCOUNTER — Other Ambulatory Visit (HOSPITAL_BASED_OUTPATIENT_CLINIC_OR_DEPARTMENT_OTHER): Payer: Self-pay

## 2024-02-23 ENCOUNTER — Other Ambulatory Visit: Payer: Self-pay

## 2024-02-23 ENCOUNTER — Telehealth: Payer: Self-pay | Admitting: Neurology

## 2024-02-23 DIAGNOSIS — G20B2 Parkinson's disease with dyskinesia, with fluctuations: Secondary | ICD-10-CM

## 2024-02-23 MED ORDER — RYTARY 61.25-245 MG PO CPCR
ORAL_CAPSULE | ORAL | 0 refills | Status: DC
Start: 2024-02-23 — End: 2024-02-23

## 2024-02-23 MED ORDER — RYTARY 61.25-245 MG PO CPCR
ORAL_CAPSULE | ORAL | 0 refills | Status: AC
  Filled 2024-02-23: qty 360, 90d supply, fill #0

## 2024-02-23 NOTE — Telephone Encounter (Signed)
 Pt. Wife cld and [facility] faxed form for dispersement medication, Pt. To start Tuesday and Facility has not rcvd fax back. Please call Facility or wife when completed  [facility] phone #[phone]---Emily Ray

## 2024-02-23 NOTE — Telephone Encounter (Signed)
 Called patients wife to get correct dose [facility] had sent 195 and pateitn is currently on 245 mg sent in new RX to pharmacy and printed that RX

## 2024-03-14 ENCOUNTER — Other Ambulatory Visit (HOSPITAL_BASED_OUTPATIENT_CLINIC_OR_DEPARTMENT_OTHER): Payer: Self-pay

## 2024-03-15 ENCOUNTER — Other Ambulatory Visit: Payer: Self-pay

## 2024-03-19 ENCOUNTER — Other Ambulatory Visit (HOSPITAL_BASED_OUTPATIENT_CLINIC_OR_DEPARTMENT_OTHER): Payer: Self-pay

## 2024-03-28 ENCOUNTER — Other Ambulatory Visit (HOSPITAL_BASED_OUTPATIENT_CLINIC_OR_DEPARTMENT_OTHER): Payer: Self-pay

## 2024-04-02 ENCOUNTER — Other Ambulatory Visit (HOSPITAL_BASED_OUTPATIENT_CLINIC_OR_DEPARTMENT_OTHER): Payer: Self-pay

## 2024-04-03 DIAGNOSIS — L218 Other seborrheic dermatitis: Secondary | ICD-10-CM | POA: Diagnosis not present

## 2024-04-03 DIAGNOSIS — Z85828 Personal history of other malignant neoplasm of skin: Secondary | ICD-10-CM | POA: Diagnosis not present

## 2024-04-03 DIAGNOSIS — D692 Other nonthrombocytopenic purpura: Secondary | ICD-10-CM | POA: Diagnosis not present

## 2024-04-03 DIAGNOSIS — L821 Other seborrheic keratosis: Secondary | ICD-10-CM | POA: Diagnosis not present

## 2024-04-16 ENCOUNTER — Telehealth: Payer: Self-pay | Admitting: Neurology

## 2024-04-16 ENCOUNTER — Other Ambulatory Visit: Payer: Self-pay

## 2024-04-16 DIAGNOSIS — R498 Other voice and resonance disorders: Secondary | ICD-10-CM

## 2024-04-16 DIAGNOSIS — R471 Dysarthria and anarthria: Secondary | ICD-10-CM

## 2024-04-16 NOTE — Telephone Encounter (Signed)
 Called and spoke to patients wife and he is declining rapidly. She has started looking into moving  him to [facility]. Patient is still walking but falling often. Patient is having trouble at night and patients wife wants to know if she can give patient an extra carbidopa  levodopa  if needed

## 2024-04-16 NOTE — Telephone Encounter (Signed)
 Also [place] has had more than 2 falls

## 2024-04-16 NOTE — Telephone Encounter (Signed)
 Pt.s Wife is stating that Barry Pope has declined and Rx is not helping and she would like a sooner appt

## 2024-04-18 ENCOUNTER — Other Ambulatory Visit (HOSPITAL_BASED_OUTPATIENT_CLINIC_OR_DEPARTMENT_OTHER): Payer: Self-pay

## 2024-04-18 NOTE — Telephone Encounter (Signed)
 Called wife and let her know he is on CX list and to not take extra Rytary  at this time

## 2024-04-19 ENCOUNTER — Other Ambulatory Visit: Payer: Self-pay

## 2024-04-19 ENCOUNTER — Other Ambulatory Visit (HOSPITAL_BASED_OUTPATIENT_CLINIC_OR_DEPARTMENT_OTHER): Payer: Self-pay

## 2024-04-19 MED ORDER — ALPRAZOLAM 0.5 MG PO TABS
0.5000 mg | ORAL_TABLET | Freq: Two times a day (BID) | ORAL | 1 refills | Status: DC | PRN
  Filled 2024-04-19: qty 30, 15d supply, fill #0

## 2024-04-23 ENCOUNTER — Other Ambulatory Visit (HOSPITAL_BASED_OUTPATIENT_CLINIC_OR_DEPARTMENT_OTHER): Payer: Self-pay

## 2024-04-23 ENCOUNTER — Encounter: Payer: Self-pay | Admitting: Neurology

## 2024-04-23 ENCOUNTER — Ambulatory Visit: Admitting: Neurology

## 2024-04-23 VITALS — BP 110/70 | HR 74 | Wt 148.8 lb

## 2024-04-23 DIAGNOSIS — G20B2 Parkinson's disease with dyskinesia, with fluctuations: Secondary | ICD-10-CM

## 2024-04-23 DIAGNOSIS — G20A1 Parkinson's disease without dyskinesia, without mention of fluctuations: Secondary | ICD-10-CM | POA: Diagnosis not present

## 2024-04-23 DIAGNOSIS — F02818 Dementia in other diseases classified elsewhere, unspecified severity, with other behavioral disturbance: Secondary | ICD-10-CM

## 2024-04-23 MED ORDER — NUPLAZID 34 MG PO CAPS: 1.0000 | ORAL_CAPSULE | Freq: Every morning | ORAL | 5 refills | Status: DC

## 2024-04-23 NOTE — Patient Instructions (Signed)
 Start nuplazid , 34 mg daily  SAVE THE DATE!  We are planning a Parkinsons Disease educational symposium at [facility], [address] [place], [place], [place] [address] on September 19.  We will have a movement disorder physician expert from [employer] coming to speak and a caregiver speaker.  We will have a panel of experts that will show you who you may need on your team of people on your journey with Parkinsons.  I hope to see you there!  Use this QR code to register by scanning it with the camera app on your phone:      Need more help with registration?  Contact [email or web address]

## 2024-04-23 NOTE — Progress Notes (Signed)
 Assessment/Plan:   1.  Parkinsons Disease  -His genetic testing was negative.  -continue rytary  245 mg, 1 capsule 4 times per day, 1 at 7am/11am/4pm/bedtime. They note some wearing off and they want to increase it but I don't want to yet given MS change and hallucinations, esp since changing living environments soon.  Making other changes first, as below.  May consider this in few weeks at f/u appt but we will see how he does with new living environment first  -We discussed that it used to be thought that levodopa  would increase risk of melanoma but now it is believed that Parkinsons itself likely increases risk of melanoma. he is to get regular skin checks.  He is following regularly with dermatology.   2.  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  -start nuplazid , 34 mg daily.  Discussed r/b/se, including QT prolongation.  Samples provided.  -going into [facility] but apparently they haven't told him yet.  3.  Depression  -On sertraline , 100 mg daily.  As above, may be making RBD worse and to discuss with prescribing physician  4.   RBD, RLS and insomnia  -sertraline  could make RBD a bit worse.  We discussed this again today as RBD is worse.  He, however, may need the sertraline  for mood  -used to follow with [facility] but doesn't now  -uses xanax  prn  -On melatonin, 10 mg nightly.  5.  B12 deficiency  -On supplementation  6.  RLS  -Now on bedtime Rytary   7.  Low back pain/lumbar radiculopathy  -has gotten injections in past from Dr. Eldonna but didn't find helpful.   Subjective:   Barry Pope was seen today in follow up.  My previous records were reviewed prior to todays visit.  Pt worked in today due to falls.  His Rytary  was increased last visit.  They called recently wanting to increase his Rytary , which I did not want him to do  without being seen, which is the reason he is being seen today.  Noting wearing off in the AM especially.  He has been to speech therapy since last visit, although no physical therapy notes are seen since December, 2024.  They are moving him to [facility].  He doesn't know it yet but they are telling him this weekend.  He has had 4 falls in last 10 days, all without the walker.  He forgets to use it.  With the last one, his arm slid down a metal cabinet and he scraped off skin on the R arm.  He's having hallucinations - he says that he recognizes that they aren't real but wife states that he doesn't always.  He is a bit paranoid about finances.     Current prescribed movement disorder medications: Rytary  245 mg, 1 capsule at 7 AM/11 AM/4 PM/bedtime (increased last visit) B12  PREVIOUS MEDICATIONS: Amantadine  (stopped when he was in the hospital for confusion, but he had a lot of reasons to have confusion at that time, including hyponatremia, significantly elevated liver enzymes, fever and ultimately dx with ebv hepatitis); carbidopa /levodopa  25/100 IR -more facial dyskinesia and confusion  ALLERGIES:  No Known Allergies  CURRENT MEDICATIONS:  Outpatient Encounter Medications as of 04/23/2024  Medication Sig   acyclovir  (ZOVIRAX ) 200 MG capsule 1 capusle Orally four times a day as needed   ALPRAZolam  (XANAX ) 0.5  MG tablet Take 1 tablet (0.5 mg total) by mouth 2 (two) times daily as needed for panic.   Carbidopa -Levodopa  ER (RYTARY ) 61.25-245 MG CPCR Take 1 capsule by mouth 4 times per day at 7am/11am/4pm/bedtime.   Cholecalciferol  (VITAMIN D -3) 25 MCG (1000 UT) CAPS 1 capsule Orally Once a day for 30 day(s)   clonazePAM  (KLONOPIN ) 0.5 MG tablet Take 0.5 tablets (0.25 mg total) by mouth at bedtime.   COVID-19 mRNA vaccine 2023-2024 (COMIRNATY ) syringe Inject into the muscle.   Cyanocobalamin  1000 MCG TBCR 1 tablet Orally Once a day   doxycycline  (VIBRAMYCIN ) 50 MG capsule Take 1 capsule (50 mg  total) by mouth daily for rosacea.   ibuprofen (ADVIL) 200 MG tablet Take 400 mg by mouth every 6 (six) hours as needed for mild pain.   sertraline  (ZOLOFT ) 100 MG tablet Take 1 tablet (100 mg total) by mouth daily.   [DISCONTINUED] tamsulosin  (FLOMAX ) 0.4 MG CAPS capsule Take 1 capsule (0.4 mg total) by mouth every evening for urinary frequency   No facility-administered encounter medications on file as of 04/23/2024.    Objective:   PHYSICAL EXAMINATION:    VITALS:   Vitals:   04/23/24 0923  BP: 110/70  Pulse: 74  SpO2: 99%  Weight: 148 lb 12.8 oz (67.5 kg)    GEN:  The patient appears stated age and is in NAD. HEENT:  Normocephalic, atraumatic.  The mucous membranes are moist. The superficial temporal arteries are without ropiness or tenderness. CV:  rrr Lungs:  ctab   Neurological examination:  Orientation: The patient is alert and oriented x3 but he is slow to react and looks to wife for finer aspects Cranial nerves: There is good facial symmetry with mild facial hypomimia. The speech is fluent and clear.  He is hypophonic.  Soft palate rises symmetrically and there is no tongue deviation. Hearing is decreased to conversational tone. Sensation: Sensation is intact to light touch throughout Motor: Strength is 5/5 in the UE/LE  Movement examination: Tone: There is mild increased tone in the RUE Abnormal movements:  There is mild facial dyskinesia; nothing in body Coordination:  There is no decremation today Gait and Station: The patient pushes off to arise.  Ambulates well in the hall.  Tries to sit in chair a bit before he is fully in front of it when he returns to the room.   I have reviewed and interpreted the following labs independently   Lab Results  Component Value Date   TSH 3.609 01/28/2022   Lab Results  Component Value Date   VITAMINB12 216 06/15/2021   Total time spent on today's visit was 31 minutes, including both face-to-face time and  nonface-to-face time.  Time included that spent on review of records (prior notes available to me/labs/imaging if pertinent), discussing treatment and goals, answering patient's questions and coordinating care.    Cc:  Loreli Kins, MD

## 2024-04-25 ENCOUNTER — Other Ambulatory Visit (HOSPITAL_BASED_OUTPATIENT_CLINIC_OR_DEPARTMENT_OTHER): Payer: Self-pay

## 2024-04-25 DIAGNOSIS — K080 Exfoliation of teeth due to systemic causes: Secondary | ICD-10-CM | POA: Diagnosis not present

## 2024-04-30 DIAGNOSIS — Z111 Encounter for screening for respiratory tuberculosis: Secondary | ICD-10-CM | POA: Diagnosis not present

## 2024-05-11 ENCOUNTER — Other Ambulatory Visit (HOSPITAL_BASED_OUTPATIENT_CLINIC_OR_DEPARTMENT_OTHER): Payer: Self-pay

## 2024-05-22 ENCOUNTER — Observation Stay (HOSPITAL_COMMUNITY)
Admission: EM | Admit: 2024-05-22 | Discharge: 2024-05-27 | Disposition: A | Discharge status: Skilled Nursing Facility | Source: Skilled Nursing Facility | Attending: Internal Medicine | Admitting: Internal Medicine

## 2024-05-22 ENCOUNTER — Emergency Department (HOSPITAL_COMMUNITY)

## 2024-05-22 ENCOUNTER — Other Ambulatory Visit: Payer: Self-pay

## 2024-05-22 ENCOUNTER — Encounter (HOSPITAL_COMMUNITY): Payer: Self-pay | Admitting: Emergency Medicine

## 2024-05-22 DIAGNOSIS — G20C Parkinsonism, unspecified: Secondary | ICD-10-CM | POA: Diagnosis not present

## 2024-05-22 DIAGNOSIS — S72113A Displaced fracture of greater trochanter of unspecified femur, initial encounter for closed fracture: Secondary | ICD-10-CM | POA: Diagnosis present

## 2024-05-22 DIAGNOSIS — S0990XA Unspecified injury of head, initial encounter: Secondary | ICD-10-CM | POA: Insufficient documentation

## 2024-05-22 DIAGNOSIS — W19XXXA Unspecified fall, initial encounter: Secondary | ICD-10-CM | POA: Diagnosis not present

## 2024-05-22 DIAGNOSIS — M6282 Rhabdomyolysis: Secondary | ICD-10-CM | POA: Insufficient documentation

## 2024-05-22 DIAGNOSIS — F0280 Dementia in other diseases classified elsewhere without behavioral disturbance: Secondary | ICD-10-CM | POA: Diagnosis not present

## 2024-05-22 DIAGNOSIS — Z79899 Other long term (current) drug therapy: Secondary | ICD-10-CM | POA: Diagnosis not present

## 2024-05-22 DIAGNOSIS — S72114A Nondisplaced fracture of greater trochanter of right femur, initial encounter for closed fracture: Secondary | ICD-10-CM | POA: Diagnosis not present

## 2024-05-22 DIAGNOSIS — Y929 Unspecified place or not applicable: Secondary | ICD-10-CM | POA: Insufficient documentation

## 2024-05-22 DIAGNOSIS — Y939 Activity, unspecified: Secondary | ICD-10-CM | POA: Diagnosis not present

## 2024-05-22 DIAGNOSIS — M25572 Pain in left ankle and joints of left foot: Secondary | ICD-10-CM | POA: Diagnosis not present

## 2024-05-22 DIAGNOSIS — S199XXA Unspecified injury of neck, initial encounter: Secondary | ICD-10-CM | POA: Diagnosis not present

## 2024-05-22 DIAGNOSIS — R9431 Abnormal electrocardiogram [ECG] [EKG]: Secondary | ICD-10-CM | POA: Diagnosis not present

## 2024-05-22 DIAGNOSIS — R748 Abnormal levels of other serum enzymes: Secondary | ICD-10-CM | POA: Insufficient documentation

## 2024-05-22 DIAGNOSIS — M47812 Spondylosis without myelopathy or radiculopathy, cervical region: Secondary | ICD-10-CM | POA: Diagnosis not present

## 2024-05-22 DIAGNOSIS — G20A1 Parkinson's disease without dyskinesia, without mention of fluctuations: Secondary | ICD-10-CM | POA: Diagnosis not present

## 2024-05-22 DIAGNOSIS — M1611 Unilateral primary osteoarthritis, right hip: Secondary | ICD-10-CM | POA: Diagnosis not present

## 2024-05-22 DIAGNOSIS — R059 Cough, unspecified: Secondary | ICD-10-CM | POA: Diagnosis not present

## 2024-05-22 DIAGNOSIS — K5730 Diverticulosis of large intestine without perforation or abscess without bleeding: Secondary | ICD-10-CM | POA: Diagnosis not present

## 2024-05-22 DIAGNOSIS — I700 Atherosclerosis of aorta: Secondary | ICD-10-CM | POA: Diagnosis not present

## 2024-05-22 DIAGNOSIS — E041 Nontoxic single thyroid nodule: Secondary | ICD-10-CM | POA: Diagnosis not present

## 2024-05-22 DIAGNOSIS — Y999 Unspecified external cause status: Secondary | ICD-10-CM | POA: Insufficient documentation

## 2024-05-22 DIAGNOSIS — S72001A Fracture of unspecified part of neck of right femur, initial encounter for closed fracture: Secondary | ICD-10-CM | POA: Diagnosis not present

## 2024-05-22 DIAGNOSIS — T796XXA Traumatic ischemia of muscle, initial encounter: Secondary | ICD-10-CM | POA: Diagnosis not present

## 2024-05-22 DIAGNOSIS — G9389 Other specified disorders of brain: Secondary | ICD-10-CM | POA: Diagnosis not present

## 2024-05-22 DIAGNOSIS — M25551 Pain in right hip: Secondary | ICD-10-CM | POA: Diagnosis not present

## 2024-05-22 DIAGNOSIS — K5900 Constipation, unspecified: Secondary | ICD-10-CM | POA: Insufficient documentation

## 2024-05-22 DIAGNOSIS — M47816 Spondylosis without myelopathy or radiculopathy, lumbar region: Secondary | ICD-10-CM | POA: Diagnosis not present

## 2024-05-22 DIAGNOSIS — Z043 Encounter for examination and observation following other accident: Secondary | ICD-10-CM | POA: Diagnosis not present

## 2024-05-22 DIAGNOSIS — D638 Anemia in other chronic diseases classified elsewhere: Secondary | ICD-10-CM

## 2024-05-22 DIAGNOSIS — M4802 Spinal stenosis, cervical region: Secondary | ICD-10-CM | POA: Diagnosis not present

## 2024-05-22 DIAGNOSIS — E871 Hypo-osmolality and hyponatremia: Secondary | ICD-10-CM | POA: Diagnosis present

## 2024-05-22 DIAGNOSIS — S72111S Displaced fracture of greater trochanter of right femur, sequela: Secondary | ICD-10-CM

## 2024-05-22 DIAGNOSIS — S79911A Unspecified injury of right hip, initial encounter: Secondary | ICD-10-CM | POA: Diagnosis not present

## 2024-05-22 DIAGNOSIS — R52 Pain, unspecified: Secondary | ICD-10-CM | POA: Insufficient documentation

## 2024-05-22 DIAGNOSIS — S72111A Displaced fracture of greater trochanter of right femur, initial encounter for closed fracture: Secondary | ICD-10-CM | POA: Diagnosis not present

## 2024-05-22 LAB — CBC WITH DIFFERENTIAL/PLATELET
Abs Immature Granulocytes: 0.02 K/uL (ref 0.00–0.07)
Basophils Absolute: 0 K/uL (ref 0.0–0.1)
Basophils Relative: 0 %
Eosinophils Absolute: 0.2 K/uL (ref 0.0–0.5)
Eosinophils Relative: 2 %
HCT: 36 % — ABNORMAL LOW (ref 39.0–52.0)
Hemoglobin: 12.7 g/dL — ABNORMAL LOW (ref 13.0–17.0)
Immature Granulocytes: 0 %
Lymphocytes Relative: 11 %
Lymphs Abs: 1 K/uL (ref 0.7–4.0)
MCH: 31.8 pg (ref 26.0–34.0)
MCHC: 35.3 g/dL (ref 30.0–36.0)
MCV: 90.2 fL (ref 80.0–100.0)
Monocytes Absolute: 0.6 K/uL (ref 0.1–1.0)
Monocytes Relative: 7 %
Neutro Abs: 6.9 K/uL (ref 1.7–7.7)
Neutrophils Relative %: 80 %
Platelets: 200 K/uL (ref 150–400)
RBC: 3.99 MIL/uL — ABNORMAL LOW (ref 4.22–5.81)
RDW: 11.5 % (ref 11.5–15.5)
WBC: 8.7 K/uL (ref 4.0–10.5)
nRBC: 0 % (ref 0.0–0.2)

## 2024-05-22 LAB — RESP PANEL BY RT-PCR (RSV, FLU A&B, COVID)  RVPGX2
Influenza A by PCR: NEGATIVE
Influenza B by PCR: NEGATIVE
Resp Syncytial Virus by PCR: NEGATIVE
SARS Coronavirus 2 by RT PCR: NEGATIVE

## 2024-05-22 LAB — URINALYSIS, W/ REFLEX TO CULTURE (INFECTION SUSPECTED)
Bacteria, UA: NONE SEEN
Bilirubin Urine: NEGATIVE
Glucose, UA: NEGATIVE mg/dL
Hgb urine dipstick: NEGATIVE
Ketones, ur: NEGATIVE mg/dL
Leukocytes,Ua: NEGATIVE
Nitrite: NEGATIVE
Protein, ur: NEGATIVE mg/dL
Specific Gravity, Urine: 1.012 (ref 1.005–1.030)
pH: 6 (ref 5.0–8.0)

## 2024-05-22 LAB — COMPREHENSIVE METABOLIC PANEL WITH GFR
ALT: 11 U/L (ref 0–44)
AST: 35 U/L (ref 15–41)
Albumin: 3.7 g/dL (ref 3.5–5.0)
Alkaline Phosphatase: 86 U/L (ref 38–126)
Anion gap: 7 (ref 5–15)
BUN: 15 mg/dL (ref 8–23)
CO2: 25 mmol/L (ref 22–32)
Calcium: 8.5 mg/dL — ABNORMAL LOW (ref 8.9–10.3)
Chloride: 100 mmol/L (ref 98–111)
Creatinine, Ser: 0.85 mg/dL (ref 0.61–1.24)
GFR, Estimated: >60 mL/min (ref >60)
Glucose, Bld: 117 mg/dL — ABNORMAL HIGH (ref 70–99)
Potassium: 3.8 mmol/L (ref 3.5–5.1)
Sodium: 132 mmol/L — ABNORMAL LOW (ref 135–145)
Total Bilirubin: 1 mg/dL (ref 0.0–1.2)
Total Protein: 6.3 g/dL — ABNORMAL LOW (ref 6.5–8.1)

## 2024-05-22 LAB — TSH: TSH: 1.61 u[IU]/mL (ref 0.350–4.500)

## 2024-05-22 LAB — CK: Total CK: 898 U/L — ABNORMAL HIGH (ref 49–397)

## 2024-05-22 MED ORDER — SENNOSIDES-DOCUSATE SODIUM 8.6-50 MG PO TABS
2.0000 | ORAL_TABLET | Freq: Two times a day (BID) | ORAL | Status: DC
  Administered 2024-05-22 – 2024-05-26 (×8): 2 via ORAL
  Filled 2024-05-22 (×9): qty 2

## 2024-05-22 MED ORDER — ONDANSETRON HCL 4 MG PO TABS: 4.0000 mg | ORAL_TABLET | Freq: Four times a day (QID) | ORAL | Status: DC | PRN

## 2024-05-22 MED ORDER — CLONAZEPAM 0.5 MG PO TABS: 0.5000 mg | ORAL_TABLET | Freq: Every day | ORAL | Status: DC

## 2024-05-22 MED ORDER — ENSURE PLUS HIGH PROTEIN PO LIQD
237.0000 mL | Freq: Two times a day (BID) | ORAL | Status: DC
  Administered 2024-05-23 – 2024-05-27 (×9): 237 mL via ORAL

## 2024-05-22 MED ORDER — DOXYCYCLINE HYCLATE 50 MG PO CAPS: 50.0000 mg | ORAL_CAPSULE | Freq: Every day | ORAL | Status: DC

## 2024-05-22 MED ORDER — ENOXAPARIN SODIUM 40 MG/0.4ML IJ SOSY
40.0000 mg | PREFILLED_SYRINGE | INTRAMUSCULAR | Status: DC
  Administered 2024-05-22 – 2024-05-26 (×6): 40 mg via SUBCUTANEOUS
  Filled 2024-05-22 (×5): qty 0.4

## 2024-05-22 MED ORDER — CARBIDOPA-LEVODOPA ER 61.25-245 MG PO CPCR: 1.0000 | ORAL_CAPSULE | Freq: Four times a day (QID) | ORAL | Status: DC

## 2024-05-22 MED ORDER — POLYETHYLENE GLYCOL 3350 17 G PO PACK: 17.0000 g | PACK | Freq: Every day | ORAL | Status: DC | PRN

## 2024-05-22 MED ORDER — CARBIDOPA-LEVODOPA ER 50-200 MG PO TBCR
1.0000 | EXTENDED_RELEASE_TABLET | Freq: Four times a day (QID) | ORAL | Status: DC
  Administered 2024-05-22 – 2024-05-27 (×19): 1 via ORAL
  Filled 2024-05-22 (×20): qty 1

## 2024-05-22 MED ORDER — ONDANSETRON HCL 4 MG/2ML IJ SOLN: 4.0000 mg | Freq: Four times a day (QID) | INTRAMUSCULAR | Status: DC | PRN

## 2024-05-22 MED ORDER — SERTRALINE HCL 100 MG PO TABS
100.0000 mg | ORAL_TABLET | Freq: Every day | ORAL | Status: DC
  Administered 2024-05-23 – 2024-05-27 (×5): 100 mg via ORAL
  Filled 2024-05-22 (×5): qty 1

## 2024-05-22 MED ORDER — ACETAMINOPHEN 325 MG PO TABS
650.0000 mg | ORAL_TABLET | Freq: Three times a day (TID) | ORAL | Status: DC
  Administered 2024-05-22 – 2024-05-27 (×17): 650 mg via ORAL
  Filled 2024-05-22 (×15): qty 2

## 2024-05-22 MED ORDER — METHOCARBAMOL 500 MG PO TABS
500.0000 mg | ORAL_TABLET | Freq: Three times a day (TID) | ORAL | Status: DC
  Administered 2024-05-22 – 2024-05-27 (×17): 500 mg via ORAL
  Filled 2024-05-22 (×15): qty 1

## 2024-05-22 MED ORDER — HYDROMORPHONE HCL 1 MG/ML IJ SOLN: 0.5000 mg | INTRAMUSCULAR | Status: DC | PRN

## 2024-05-22 MED ORDER — PIMAVANSERIN TARTRATE 34 MG PO CAPS: 1.0000 | ORAL_CAPSULE | Freq: Every morning | ORAL | Status: DC

## 2024-05-22 MED ORDER — OXYCODONE HCL 5 MG PO TABS: 5.0000 mg | ORAL_TABLET | ORAL | Status: DC | PRN

## 2024-05-22 MED ORDER — SODIUM CHLORIDE 0.45 % IV SOLN: INTRAVENOUS | Status: DC

## 2024-05-22 NOTE — ED Triage Notes (Signed)
 Patient presents from [facility] on the memory care side. He had an unwitnessed fall around 0430 today. Staff assisted him off the floor. When his wife arrived, she noticed he had an antalgic lean to the lean and complained of right hip pain. EMS noted shortening on the right. He is cognitively at baseline. The facility is unsure if he hit his head.   EMS vitals: 130/70 BP 99% SPO2 on room air 71 HR 150 CBG

## 2024-05-22 NOTE — H&P (Signed)
 [facility] History and Physical  ABDULLOH ULLOM FMW:[id number] DOB: [birth year] DOA: 05/22/2024   PCP: Loreli Kins, MD  Specialists: Followed by Dr. Evonnie with neurology  Chief Complaint: Fall with right hip pain  HPI: Barry Pope is a 78 y.o. male with a past medical history of Parkinson's disease, dementia who was in his usual state of health at his memory care unit where he has been staying for about a week.  According to this wife, who was at the bedside, patient has had several falls over the past several months.  Main reason he is that he tends to walk without his walker.  He forgets to use it.  He was moved to the memory care unit about a week ago.  He was found lying on the floor at 4:30 this morning.  EMS was called and the patient was transported to the emergency department.  Patient has dementia and unable to provide any information to me.  Hence history is limited.  Most of the information was obtained from the patient's wife as well as notes from other providers.  Patient does not appear to be in any major discomfort or distress at this time.  In the emergency department patient underwent blood work which showed mildly elevated CK level.  Imaging studies raise concern for greater trochanter fracture.  Patient was mobilized however he could not due to severe pain.  Unable to go back to his facility at this time.  He will be observed in the hospital.  Home Medications: This list is not reconciled yet. Prior to Admission medications   Medication Sig Start Date End Date Taking? Authorizing Provider  acyclovir  (ZOVIRAX ) 200 MG capsule 1 capusle Orally four times a day as needed 05/02/22     ALPRAZolam  (XANAX ) 0.5 MG tablet Take 1 tablet (0.5 mg total) by mouth 2 (two) times daily as needed for panic. 04/19/24     Carbidopa -Levodopa  ER (RYTARY ) 61.25-245 MG CPCR Take 1 capsule by mouth 4 times per day at 7am/11am/4pm/bedtime. 02/23/24   Tat, Asberry RAMAN, DO  Cholecalciferol   (VITAMIN D -3) 25 MCG (1000 UT) CAPS 1 capsule Orally Once a day for 30 day(s)    [provider]  clonazePAM  (KLONOPIN ) 0.5 MG tablet Take 0.5 tablets (0.25 mg total) by mouth at bedtime. 12/07/23   TatAsberry RAMAN, DO  COVID-19 mRNA vaccine [phone] (COMIRNATY ) syringe Inject into the muscle. 08/10/22   Luiz Channel, MD  Cyanocobalamin  1000 MCG TBCR 1 tablet Orally Once a day    [provider]  doxycycline  (VIBRAMYCIN ) 50 MG capsule Take 1 capsule (50 mg total) by mouth daily for rosacea. 12/20/23     ibuprofen (ADVIL) 200 MG tablet Take 400 mg by mouth every 6 (six) hours as needed for mild pain.    [provider]  Pimavanserin  Tartrate (NUPLAZID ) 34 MG CAPS Take 1 capsule (34 mg total) by mouth every morning. 04/23/24   Tat, Asberry RAMAN, DO  sertraline  (ZOLOFT ) 100 MG tablet Take 1 tablet (100 mg total) by mouth daily. 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  No history of smoking alcohol use or recreational drug use.   Family History:  Family History  Problem Relation Age of Onset   Pulmonary fibrosis Mother    Heart attack Father    Alcoholism Father    Parkinson's disease Father    CAD Father     Lung cancer Sister      Review of Systems -unable to do due to his dementia  Physical Examination  Vitals:   05/22/24 1129 05/22/24 1200 05/22/24 1300 05/22/24 1535  BP: (!) 153/79 (!) 153/83 (!) 143/87 (!) 159/88  Pulse: 67 77 74 76  Resp: 14 (!) 21 16 19   Temp: (!) 97.4 F (36.3 C)   97.9 F (36.6 C)  TempSrc: Oral   Oral  SpO2: 100% 100% 100% 97%    BP (!) 159/88 (BP Location: Right Arm)   Pulse 76   Temp 97.9 F (36.6 C) (Oral)   Resp 19   SpO2 97%   General appearance: alert, appears stated age, distracted, and no distress Head: Normocephalic, without obvious abnormality, atraumatic Eyes: conjunctivae/corneas clear. PERRL. Throat: lips, mucosa, and tongue normal; teeth and gums normal Neck: no adenopathy, no carotid bruit, no JVD, supple, symmetrical, trachea midline, and thyroid  not enlarged, symmetric, no tenderness/mass/nodules Resp: clear to auscultation bilaterally Cardio: regular rate and rhythm, S1, S2 normal, no murmur, click, rub or gallop GI: soft, non-tender; bowel sounds normal; no masses,  no organomegaly Extremities: extremities normal, atraumatic, no cyanosis or edema.  Limited range of motion of the lower extremities as well as upper extremity due to rigidity from his Parkinson's. Pulses: 2+ and symmetric Skin: Skin color, texture, turgor normal. No rashes or lesions Lymph nodes: Cervical, supraclavicular, and axillary nodes normal. Neurologic: Disoriented.  No obvious focal neurological deficits.  Difficult examination as patient was not able to fully comply with instructions.   Labs on Admission: I have personally reviewed following labs and imaging studies  CBC: Recent Labs  Lab 05/22/24 1311  WBC 8.7  NEUTROABS 6.9  HGB 12.7*  HCT 36.0*  MCV 90.2  PLT 200   Basic Metabolic Panel: Recent Labs  Lab 05/22/24 1311  NA 132*  K 3.8  CL 100  CO2 25  GLUCOSE 117*  BUN 15  CREATININE 0.85  CALCIUM 8.5*   GFR: CrCl cannot be  calculated (Unknown ideal weight.). Liver Function Tests: Recent Labs  Lab 05/22/24 1311  AST 35  ALT 11  ALKPHOS 86  BILITOT 1.0  PROT 6.3*  ALBUMIN 3.7    Recent Labs  Lab 05/22/24 1311  CKTOTAL 898*    Thyroid  Function Tests: Recent Labs    05/22/24 1311  TSH 1.610    Radiological Exams on Admission: CT Hip Right Wo Contrast Result Date: 05/22/2024 CLINICAL DATA:  Status post trauma. EXAM: CT OF THE RIGHT HIP WITHOUT CONTRAST TECHNIQUE: Multidetector CT imaging of the right hip was performed according to the standard protocol. Multiplanar CT image reconstructions were also generated. RADIATION DOSE REDUCTION: This exam was performed according to the departmental dose-optimization program which  includes automated exposure control, adjustment of the mA and/or kV according to patient size and/or use of iterative reconstruction technique. COMPARISON:  None Available. FINDINGS: Bones/Joint/Cartilage Acute fracture deformity is seen involving the greater trochanter of the proximal right femur. There is no evidence of dislocation. Degenerative changes are seen involving the right hip in the form of joint space narrowing, subchondral cyst formation, acetabular sclerosis and lateral acetabular bony spurring. Ligaments Suboptimally assessed by CT. Muscles and Tendons Unremarkable Soft tissues Multiple surgical clips are seen within right inguinal region. A large amount of stool is seen within the sigmoid colon. Numerous noninflamed diverticula are also seen within this region. IMPRESSION: 1. Acute fracture of the greater trochanter of the proximal right femur. 2. Degenerative changes of the right hip. 3. Sigmoid diverticulosis. Electronically Signed   By: Suzen Dials M.D.   On: 05/22/2024 16:10   CT Cervical Spine Wo Contrast Result Date: 05/22/2024 CLINICAL DATA:  Status post trauma. EXAM: CT CERVICAL SPINE WITHOUT CONTRAST TECHNIQUE: Multidetector CT imaging of the cervical spine was  performed without intravenous contrast. Multiplanar CT image reconstructions were also generated. RADIATION DOSE REDUCTION: This exam was performed according to the departmental dose-optimization program which includes automated exposure control, adjustment of the mA and/or kV according to patient size and/or use of iterative reconstruction technique. COMPARISON:  None Available. FINDINGS: Alignment: Approximally 2 mm retrolisthesis of the C3 vertebral body is noted on C4. Skull base and vertebrae: No acute fracture. No primary bone lesion or focal pathologic process. Soft tissues and spinal canal: No prevertebral fluid or swelling. No visible canal hematoma. Disc levels: Marked severity endplate sclerosis is seen at the level of C6-C7 with moderate severity endplate sclerosis present throughout the remainder of the cervical spine. Mild to moderate severity anterior osteophyte formation is seen throughout the cervical spine with posterior bony spurring noted at the levels of C3-C4, C5-C6 and C6-C7. Moderate to marked severity intervertebral disc space narrowing is seen at C6-C7 with mild to moderate severity multilevel intervertebral disc space narrowing present throughout the remainder of the cervical spine. Bilateral moderate to marked severity facet joint hypertrophy is noted. Upper chest: Mild biapical scarring and/or atelectasis is seen, right greater than left. Other: A 1.6 cm heterogeneous, low-attenuation thyroid  nodule is seen within the left lobe of the thyroid  gland. IMPRESSION: 1. No acute fracture or traumatic subluxation of the cervical spine. 2. Moderate to marked severity multilevel degenerative changes, as described above. 3. 1.6 cm heterogeneous, low-attenuation thyroid  nodule within the left lobe of the thyroid  gland. Correlation with nonemergent thyroid  ultrasound is recommended. Electronically Signed   By: Suzen Dials M.D.   On: 05/22/2024 16:08   CT Head Wo Contrast Result Date:  05/22/2024 CLINICAL DATA:  Status post trauma. EXAM: CT HEAD WITHOUT CONTRAST TECHNIQUE: Contiguous axial images were obtained from the base of the skull through the vertex without intravenous contrast. RADIATION DOSE REDUCTION: This exam was performed according to the departmental dose-optimization program which includes automated exposure control, adjustment of the mA and/or kV according to patient size and/or use of iterative reconstruction technique. COMPARISON:  January 27, 2022 FINDINGS: Brain: There is generalized cerebral atrophy with widening of the extra-axial spaces and ventricular dilatation. There are areas of decreased attenuation within the white matter tracts of the supratentorial brain, consistent with microvascular disease changes. Vascular: No hyperdense vessel or unexpected calcification. Skull: Normal. Negative for fracture or focal lesion. Sinuses/Orbits: No acute finding. Other: None. IMPRESSION: 1. No acute intracranial abnormality. 2. Generalized cerebral atrophy  and microvascular disease changes of the supratentorial brain. Electronically Signed   By: Suzen Dials M.D.   On: 05/22/2024 16:04   DG Hip Unilat  With Pelvis 2-3 Views Right Result Date: 05/22/2024 CLINICAL DATA:  Pain EXAM: DG HIP (WITH OR WITHOUT PELVIS) 2-3V RIGHT COMPARISON:  None Available. FINDINGS: Apophyseal avulsion fracture of the right greater trochanter with mild displacement. No other fracture or dislocation. There is no evidence of arthropathy or other focal bone abnormality. Postsurgical changes overlying the right pelvic region likely from prior hernia mesh repair. Degenerative changes of the lower lumbar spine. IMPRESSION: Isolated greater trochanter fracture of the right femur, possibly avulsion type. Electronically Signed   By: Megan  Zare M.D.   On: 05/22/2024 12:52   DG Chest Port 1 View Result Date: 05/22/2024 CLINICAL DATA:  Fall. EXAM: PORTABLE CHEST 1 VIEW COMPARISON:  01/27/2022. FINDINGS: The  heart size and mediastinal contours are within normal limits. Aortic atherosclerosis. No focal consolidation, pleural effusion, or pneumothorax. Moderate degenerative arthropathy of the left glenohumeral joint. No acute osseous abnormality. IMPRESSION: No acute findings in the chest. Electronically Signed   By: Harrietta Sherry M.D.   On: 05/22/2024 12:52    My interpretation of Electrocardiogram: Sinus rhythm in the 70s.  Normal axis.  Intervals are normal.  No concerning ST or T wave changes are noted.   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  This resulted in a fracture of the right greater trochanter.  Case was discussed with orthopedics and this is a nonsurgical injury.  Patient will be brought in for pain control.  May need higher level of care.  Plan: #1. Mechanical fall resulting in fracture of the right greater trochanter: Attempts were made to mobilize the patient in the emergency department however due to significant pain these attempts were futile.  While he is still he does not appear to be in a lot of pain or discomfort.  He recently moved into a memory care unit.  His wife is unable to care for him at home.  He may need higher level of care.  Initiate scheduled pain medications.  PT and OT evaluation from tomorrow.  Muscle relaxants will also be utilized.  CT of the head as well as cervical spine does not show any injuries.  #2. Mild rhabdomyolysis: CK was noted to be mildly elevated at 898.  He will be gently hydrated.  Will recheck labs in the morning.  #3. Parkinson's disease with dementia: Followed by neurology in the outpatient setting.  Will resume his  home medications.  According to his wife his dementia has been getting worse progressively over the last several months.  Currently at a memory care unit.  #4. Hyponatremia: Hydrate and recheck labs in the morning.  DVT Prophylaxis: Lovenox  Code Status: DNR based on my discussion with patient's wife Family Communication: Discussed with patient's wife Disposition: To be determined Consults called: Orthopedics called by EDP Admission Status: Status is: Observation The patient remains OBS appropriate and will d/c before 2 midnights.    Severity of Illness: The appropriate patient status for this patient is OBSERVATION. Observation status is judged to be reasonable and necessary  in order to provide the required intensity of service to ensure the patient's safety. The patient's presenting symptoms, physical exam findings, and initial radiographic and laboratory data in the context of their medical condition is felt to place them at decreased risk for further clinical deterioration. Furthermore, it is anticipated that the patient will be medically stable for discharge from the hospital within 2 midnights of admission.    Further management decisions will depend on results of further testing and patient's response to treatment.   Dinari Stgermaine  [facility] Pager on [email or web address]  05/22/2024, 4:18 PM

## 2024-05-22 NOTE — Group Note (Deleted)
 Date:  05/22/2024 Time:  2:21 PM  Group Topic/Focus:  Wellness Toolbox:   The focus of this group is to discuss various aspects of wellness, balancing those aspects and exploring ways to increase the ability to experience wellness.  Patients will create a wellness toolbox for use upon discharge.     Participation Level:  {BHH PARTICIPATION OZCZO:77735}  Participation Quality:  {BHH PARTICIPATION QUALITY:22265}  Affect:  {BHH AFFECT:22266}  Cognitive:  {BHH COGNITIVE:22267}  Insight: {BHH Insight2:20797}  Engagement in Group:  {BHH ENGAGEMENT IN HMNLE:77731}  Modes of Intervention:  {BHH MODES OF INTERVENTION:22269}  Additional Comments:  ***  Barry Pope 05/22/2024, 2:21 PM

## 2024-05-22 NOTE — Plan of Care (Signed)

## 2024-05-22 NOTE — ED Provider Notes (Signed)
 [place] EMERGENCY DEPARTMENT AT [facility] Provider Note   CSN: [id number] Arrival date & time: 05/22/24  1115     Patient presents with: Fall and Hip Pain   Barry Pope is a 78 y.o. male.   The history is provided by the patient and medical records. No language interpreter was used.  Fall This is a new problem. The current episode started 3 to 5 hours ago. The problem occurs rarely. The problem has not changed since onset.Pertinent negatives include no chest pain, no abdominal pain, no headaches and no shortness of breath. The symptoms are aggravated by standing. Nothing relieves the symptoms. He has tried nothing for the symptoms. The treatment provided no relief.  Hip Pain This is a new problem. The problem has not changed since onset.Pertinent negatives include no chest pain, no abdominal pain, no headaches and no shortness of breath. He has tried nothing for the symptoms. The treatment provided no relief.       Prior to Admission medications   Medication Sig Start Date End Date Taking? Authorizing Provider  acyclovir  (ZOVIRAX ) 200 MG capsule 1 capusle Orally four times a day as needed 05/02/22     ALPRAZolam  (XANAX ) 0.5 MG tablet Take 1 tablet (0.5 mg total) by mouth 2 (two) times daily as needed for panic. 04/19/24     Carbidopa -Levodopa  ER (RYTARY ) 61.25-245 MG CPCR Take 1 capsule by mouth 4 times per day at 7am/11am/4pm/bedtime. 02/23/24   Tat, Asberry RAMAN, DO  Cholecalciferol  (VITAMIN D -3) 25 MCG (1000 UT) CAPS 1 capsule Orally Once a day for 30 day(s)    [provider]  clonazePAM  (KLONOPIN ) 0.5 MG tablet Take 0.5 tablets (0.25 mg total) by mouth at bedtime. 12/07/23   TatAsberry RAMAN, DO  COVID-19 mRNA vaccine [phone] (COMIRNATY ) syringe Inject into the muscle. 08/10/22   Luiz Channel, MD  Cyanocobalamin  1000 MCG TBCR 1 tablet Orally Once a day    [provider]  doxycycline  (VIBRAMYCIN ) 50 MG capsule Take 1 capsule (50 mg total) by  mouth daily for rosacea. 12/20/23     ibuprofen (ADVIL) 200 MG tablet Take 400 mg by mouth every 6 (six) hours as needed for mild pain.    [provider]  Pimavanserin  Tartrate (NUPLAZID ) 34 MG CAPS Take 1 capsule (34 mg total) by mouth every morning. 04/23/24   Tat, Asberry RAMAN, DO  sertraline  (ZOLOFT ) 100 MG tablet Take 1 tablet (100 mg total) by mouth daily. 12/29/23       Allergies: Patient has no known allergies.    Review of Systems  Constitutional:  Negative for chills, fatigue and fever.  HENT:  Negative for congestion.   Eyes:  Negative for visual disturbance.  Respiratory:  Positive for cough. Negative for chest tightness, shortness of breath and wheezing.   Cardiovascular:  Positive for leg swelling (at baseline per family). Negative for chest pain and palpitations.  Gastrointestinal:  Negative for abdominal pain, constipation, diarrhea, nausea and vomiting.  Genitourinary:  Positive for decreased urine volume (darker urine per family). Negative for dysuria and flank pain.  Musculoskeletal:  Negative for back pain, neck pain and neck stiffness.  Skin:  Positive for wound (abrasion to R head). Negative for rash.  Neurological:  Negative for weakness, light-headedness and headaches.  Psychiatric/Behavioral:  Negative for agitation and confusion.   All other systems reviewed and are negative.   Updated Vital Signs BP (!) 153/79 (BP Location: Right Arm)   Pulse 67   Temp ROLLEN)  97.4 F (36.3 C) (Oral)   Resp 14   SpO2 100%   Physical Exam Vitals and nursing note reviewed.  Constitutional:      General: He is not in acute distress.    Appearance: He is well-developed. He is not ill-appearing, toxic-appearing or diaphoretic.  HENT:     Head: Normocephalic and atraumatic.     Right Ear: External ear normal.     Left Ear: External ear normal.     Nose: Nose normal. No congestion or rhinorrhea.     Mouth/Throat:     Mouth: Mucous membranes are dry.     Pharynx: No  oropharyngeal exudate or posterior oropharyngeal erythema.  Eyes:     Extraocular Movements: Extraocular movements intact.     Conjunctiva/sclera: Conjunctivae normal.     Pupils: Pupils are equal, round, and reactive to light.  Cardiovascular:     Rate and Rhythm: Normal rate.     Pulses: Normal pulses.     Heart sounds: No murmur heard. Pulmonary:     Effort: No respiratory distress.     Breath sounds: No stridor. No wheezing, rhonchi or rales.  Chest:     Chest wall: No tenderness.  Abdominal:     General: Abdomen is flat. There is no distension.     Palpations: Abdomen is soft.     Tenderness: There is no abdominal tenderness. There is no right CVA tenderness, left CVA tenderness, guarding or rebound.  Musculoskeletal:        General: Tenderness present.     Cervical back: Normal range of motion and neck supple. No tenderness.  Skin:    General: Skin is warm.     Coloration: Skin is not pale.     Findings: Bruising present. No erythema or rash.  Neurological:     Mental Status: He is alert and oriented to person, place, and time.     Cranial Nerves: No cranial nerve deficit.     Motor: No abnormal muscle tone.     Coordination: Coordination normal.     Deep Tendon Reflexes: Reflexes normal.     (all labs ordered are listed, but only abnormal results are displayed) Labs Reviewed  CBC WITH DIFFERENTIAL/PLATELET - Abnormal; Notable for the following components:      Result Value   RBC 3.99 (*)    Hemoglobin 12.7 (*)    HCT 36.0 (*)    All other components within normal limits  COMPREHENSIVE METABOLIC PANEL WITH GFR - Abnormal; Notable for the following components:   Sodium 132 (*)    Glucose, Bld 117 (*)    Calcium 8.5 (*)    Total Protein 6.3 (*)    All other components within normal limits  CK - Abnormal; Notable for the following components:   Total CK 898 (*)    All other components within normal limits  RESP PANEL BY RT-PCR (RSV, FLU A&B, COVID)  RVPGX2  TSH   URINALYSIS, W/ REFLEX TO CULTURE (INFECTION SUSPECTED)    EKG: None  Radiology: CT Head Wo Contrast Result Date: 05/22/2024 CLINICAL DATA:  Status post trauma. EXAM: CT HEAD WITHOUT CONTRAST TECHNIQUE: Contiguous axial images were obtained from the base of the skull through the vertex without intravenous contrast. RADIATION DOSE REDUCTION: This exam was performed according to the departmental dose-optimization program which includes automated exposure control, adjustment of the mA and/or kV according to patient size and/or use of iterative reconstruction technique. COMPARISON:  January 27, 2022 FINDINGS: Brain: There is generalized  cerebral atrophy with widening of the extra-axial spaces and ventricular dilatation. There are areas of decreased attenuation within the white matter tracts of the supratentorial brain, consistent with microvascular disease changes. Vascular: No hyperdense vessel or unexpected calcification. Skull: Normal. Negative for fracture or focal lesion. Sinuses/Orbits: No acute finding. Other: None. IMPRESSION: 1. No acute intracranial abnormality. 2. Generalized cerebral atrophy and microvascular disease changes of the supratentorial brain. Electronically Signed   By: Suzen Dials M.D.   On: 05/22/2024 16:04   DG Hip Unilat  With Pelvis 2-3 Views Right Result Date: 05/22/2024 CLINICAL DATA:  Pain EXAM: DG HIP (WITH OR WITHOUT PELVIS) 2-3V RIGHT COMPARISON:  None Available. FINDINGS: Apophyseal avulsion fracture of the right greater trochanter with mild displacement. No other fracture or dislocation. There is no evidence of arthropathy or other focal bone abnormality. Postsurgical changes overlying the right pelvic region likely from prior hernia mesh repair. Degenerative changes of the lower lumbar spine. IMPRESSION: Isolated greater trochanter fracture of the right femur, possibly avulsion type. Electronically Signed   By: Megan  Zare M.D.   On: 05/22/2024 12:52   DG Chest Port  1 View Result Date: 05/22/2024 CLINICAL DATA:  Fall. EXAM: PORTABLE CHEST 1 VIEW COMPARISON:  01/27/2022. FINDINGS: The heart size and mediastinal contours are within normal limits. Aortic atherosclerosis. No focal consolidation, pleural effusion, or pneumothorax. Moderate degenerative arthropathy of the left glenohumeral joint. No acute osseous abnormality. IMPRESSION: No acute findings in the chest. Electronically Signed   By: Harrietta Sherry M.D.   On: 05/22/2024 12:52     Procedures   Medications Ordered in the ED - No data to display                                  Medical Decision Making Amount and/or Complexity of Data Reviewed Labs: ordered. Radiology: ordered.  Risk Decision regarding hospitalization.    Barry Pope is a 78 y.o. male with a past medical history significant for Parkinson's disease, GERD, and cancer who presents with fall.  According to patient's family, he recently was placed in [facility] memory care about a week ago and has been having a difficult time with it.  He reportedly had an unwitnessed fall the fourth a.m. this morning and was on the ground before he was picked up.  When family arrived he was leaning to that side and complaining of right hip pain.  Patient is at his baseline mental status per family at this time.  No recent fevers, chills chest pain or shortness of breath.  He has a dry cough per family and urine was darker per family over the last few days.  No reported dysuria.  Patient is reporting pain in his right hip but denies any headache or neck pain.  He does however exactly what happened but she thinks he got up disoriented and had a fall.  He does use a walker normally.  On exam, lungs clear.  Chest nontender.  Abdomen nontender.  Back nontender.  He has strength and sensation in both arms and could move both feet however he had pain with right hip movement.  His right leg was possibly slightly shorter than the left and he had  tenderness on his right hip.  Pain with passive movement of the right hip.  Pupils were symmetric and reactive and he was answering some questions.  He has an abrasion to his right scalp with  a hematoma but no large laceration.  We agreed to get CT of the head and neck, chest x-ray, and hip/pelvis x-ray.  On my bedside review I am concerned about a pelvis fracture.  Will get screening labs as well.  With the urinary changes we will get a this urinalysis, with the darkness will get a CK, and we will get some basic labs.  Given his inability to ambulate with the pain, anticipate admission for possible hip fracture when workup is completed.  1:19 PM X-ray does show possible greater trochanteric fracture.  I spoke to Dr. Celena who does recommend a CT of the right hip to further evaluate to help determine disposition.  3:35 PM Spoke to orthopedics who reviewed the CT scan and it does appear to be more of an avulsion fracture than a send acute fracture needing surgical intervention at this time.  I went and spoke with the family and they are concerned about patient, further falls, and pain control.  Patient still try to get up out of the bed as his dementia seems to worsen in the setting of the acute pain.  He still has not been able to urinate for us  but his CK is nearly 900.  Will order some fluids for this.  I am concerned about some mild traumatic rhabdo.  He is still waiting on the head CT and cervical spine CT to return but we will plan for admission given the mild traumatic rhabdo, confusion with pain control, and worry for appropriate level of care given his dementia, baseline confusion, and worry for further falls.       Final diagnoses:  Closed fracture of right hip, initial encounter [facility])  Fall, initial encounter  Injury of head, initial encounter  Elevated CK    ED Discharge Orders     None       Clinical Impression: 1. Closed fracture of right hip, initial encounter (HCC)   2.  Fall, initial encounter   3. Injury of head, initial encounter   4. Elevated CK     Disposition: Admit  This note was prepared with assistance of Dragon voice recognition software. Occasional wrong-word or sound-a-like substitutions may have occurred due to the inherent limitations of voice recognition software.       Taneal Sonntag, Lonni PARAS, MD 05/22/24 [phone]

## 2024-05-23 ENCOUNTER — Ambulatory Visit: Payer: Medicare Other | Admitting: Physical Therapy

## 2024-05-23 ENCOUNTER — Ambulatory Visit: Payer: Medicare Other

## 2024-05-23 ENCOUNTER — Ambulatory Visit: Payer: Medicare Other | Admitting: Occupational Therapy

## 2024-05-23 DIAGNOSIS — E871 Hypo-osmolality and hyponatremia: Secondary | ICD-10-CM | POA: Diagnosis not present

## 2024-05-23 DIAGNOSIS — S72114A Nondisplaced fracture of greater trochanter of right femur, initial encounter for closed fracture: Secondary | ICD-10-CM | POA: Diagnosis not present

## 2024-05-23 DIAGNOSIS — T796XXA Traumatic ischemia of muscle, initial encounter: Secondary | ICD-10-CM | POA: Diagnosis not present

## 2024-05-23 LAB — BASIC METABOLIC PANEL WITH GFR
Anion gap: 9 (ref 5–15)
BUN: 10 mg/dL (ref 8–23)
CO2: 22 mmol/L (ref 22–32)
Calcium: 8.1 mg/dL — ABNORMAL LOW (ref 8.9–10.3)
Chloride: 100 mmol/L (ref 98–111)
Creatinine, Ser: 0.79 mg/dL (ref 0.61–1.24)
GFR, Estimated: >60 mL/min (ref >60)
Glucose, Bld: 100 mg/dL — ABNORMAL HIGH (ref 70–99)
Potassium: 3.8 mmol/L (ref 3.5–5.1)
Sodium: 131 mmol/L — ABNORMAL LOW (ref 135–145)

## 2024-05-23 LAB — CBC
HCT: 33.8 % — ABNORMAL LOW (ref 39.0–52.0)
Hemoglobin: 11.8 g/dL — ABNORMAL LOW (ref 13.0–17.0)
MCH: 31.6 pg (ref 26.0–34.0)
MCHC: 34.9 g/dL (ref 30.0–36.0)
MCV: 90.6 fL (ref 80.0–100.0)
Platelets: 183 K/uL (ref 150–400)
RBC: 3.73 MIL/uL — ABNORMAL LOW (ref 4.22–5.81)
RDW: 11.6 % (ref 11.5–15.5)
WBC: 6.6 K/uL (ref 4.0–10.5)
nRBC: 0 % (ref 0.0–0.2)

## 2024-05-23 LAB — CK: Total CK: 961 U/L — ABNORMAL HIGH (ref 49–397)

## 2024-05-23 MED ORDER — SODIUM CHLORIDE 0.9 % IV SOLN: INTRAVENOUS | Status: DC

## 2024-05-23 MED ORDER — ORAL CARE MOUTH RINSE: 15.0000 mL | OROMUCOSAL | Status: DC | PRN

## 2024-05-23 NOTE — Evaluation (Signed)
 Occupational Therapy Evaluation Patient Details Name: Barry Pope MRN: [id number] DOB: 10/12/1945 Today's Date: 05/23/2024   History of Present Illness   Barry Pope is a 78 y.o. male with a past medical history of Parkinson's disease, dementia who was in his usual state of health at his memory care unit where he has been staying for about a week. Per pt's wife, pt has had multiple falls over the past several months as he tends to walk without his walker forgets to use it.  He was moved to the memory care unit about a week ago. Pt was found lying on the floor at 4:30 a.m. morning of admission.  EMS was called and the patient was transported to the emergency department     Clinical Impressions Pt presents with decline in function and safety with ADLs and ADL mobility with impaired strength, balance, endurance, coordination and cognition (hx of dementia). PTA pt was living at [facility] and has been on memory care nit for ~1 week when he fell. Pt's wife previously provided his care along with caregiver assist. Pt's wife reports that pt required Sup-min A with ADLs/selfcare, used a rollater but forgets to use it at night when he gets OOB to go to the bathroom and was having multiple falls prior to moving to memory care. Pt currently requires max A with LB ADLs, max A with toileting, mod/min A with mobility using RW with difficulty initiating steps. Pt HOH, has hearing aids. OT will follow acutely to maximize level of function and safety      If plan is discharge home, recommend the following:   A lot of help with bathing/dressing/bathroom;A lot of help with walking and/or transfers;Supervision due to cognitive status;Assist for transportation;Direct supervision/assist for medications management;Help with stairs or ramp for entrance     Functional Status Assessment   Patient has had a recent decline in their functional status and demonstrates the ability to make significant  improvements in function in a reasonable and predictable amount of time.     Equipment Recommendations   Other (comment) (defer)     Recommendations for Other Services   PT consult     Precautions/Restrictions   Precautions Precautions: Fall;Other (comment) Precaution/Restrictions Comments: hx of dementia, confusion, Parkinson's, HOH Restrictions Weight Bearing Restrictions Per Provider Order: No     Mobility Bed Mobility               General bed mobility comments: pt in recliner    Transfers Overall transfer level: Needs assistance Equipment used: Rolling walker (2 wheels) Transfers: Sit to/from Stand, Bed to chair/wheelchair/BSC Sit to Stand: Mod assist, Min assist Stand pivot transfers: Min assist         General transfer comment: mod A initiale stand from chair, min A SPT      Balance Overall balance assessment: Needs assistance Sitting-balance support: No upper extremity supported, Feet supported Sitting balance-Leahy Scale: Fair     Standing balance support: Bilateral upper extremity supported, During functional activity Standing balance-Leahy Scale: Poor                             ADL either performed or assessed with clinical judgement   ADL Overall ADL's : Needs assistance/impaired Eating/Feeding: Set up;Supervision/ safety;Sitting   Grooming: Wash/dry hands;Wash/dry face;Minimal assistance;Standing   Upper Body Bathing: Contact guard assist;Sitting   Lower Body Bathing: Maximal assistance   Upper Body Dressing : Contact guard assist;Sitting  Lower Body Dressing: Maximal assistance   Toilet Transfer: Moderate assistance;Minimal assistance;Cueing for safety;Cueing for sequencing;Stand-pivot;Rolling walker (2 wheels);BSC/3in1   Toileting- Clothing Manipulation and Hygiene: Maximal assistance;Sit to/from stand       Functional mobility during ADLs: Moderate assistance;Minimal assistance;Rolling walker (2  wheels);Cueing for safety;Cueing for sequencing General ADL Comments: impaired cognition, coordination, balance and strength     Vision Baseline Vision/History: 1 Wears glasses Ability to See in Adequate Light: 0 Adequate Patient Visual Report: No change from baseline       Perception         Praxis         Pertinent Vitals/Pain Pain Assessment Pain Assessment: No/denies pain Pain Score: 0-No pain Pain Intervention(s): Monitored during session, Repositioned     Extremity/Trunk Assessment Upper Extremity Assessment Upper Extremity Assessment: Generalized weakness;Right hand dominant   Lower Extremity Assessment Lower Extremity Assessment: Defer to PT evaluation   Cervical / Trunk Assessment Cervical / Trunk Assessment: Kyphotic   Communication Communication Communication: No apparent difficulties Factors Affecting Communication: Hearing impaired;Reduced clarity of speech   Cognition Arousal: Alert Behavior During Therapy: WFL for tasks assessed/performed, Flat affect Cognition: History of cognitive impairments, Cognition impaired   Orientation impairments: Time, Situation   Memory impairment (select all impairments): Short-term memory   Executive functioning impairment (select all impairments): Initiation, Reasoning, Problem solving                   Following commands: Intact       Cueing  General Comments   Cueing Techniques: Verbal cues;Tactile cues;Visual cues      Exercises     Shoulder Instructions      Home Living Family/patient expects to be discharged to:: Skilled nursing facility                                        Prior Functioning/Environment Prior Level of Function : Needs assist;History of Falls (last six months)  Cognitive Assist : Mobility (cognitive);ADLs (cognitive)     Physical Assist : Mobility (physical);ADLs (physical)     Mobility Comments: wife reports pt uses rollater but gets up at night to  use bathroom, forgets to use rollater and falls ADLs Comments: Sip-min A with cues with ADLs/selfcare, balance impaired, cognition impaired    OT Problem List: Decreased cognition;Impaired balance (sitting and/or standing);Decreased strength;Decreased knowledge of precautions;Decreased safety awareness;Decreased activity tolerance;Decreased coordination;Decreased knowledge of use of DME or AE   OT Treatment/Interventions: Self-care/ADL training;Therapeutic exercise;Patient/family education;Balance training;Therapeutic activities;DME and/or AE instruction      OT Goals(Current goals can be found in the care plan section)   Acute Rehab OT Goals Patient Stated Goal: get better OT Goal Formulation: With patient Time For Goal Achievement: 06/06/24 Potential to Achieve Goals: Good ADL Goals Pt Will Perform Grooming: with contact guard assist;with supervision;standing Pt Will Perform Upper Body Bathing: with supervision;with set-up;sitting Pt Will Perform Lower Body Bathing: with mod assist;sitting/lateral leans;sit to/from stand Pt Will Perform Upper Body Dressing: with supervision;with set-up;sitting Pt Will Transfer to Toilet: with min assist;with contact guard assist;ambulating;stand pivot transfer Pt Will Perform Toileting - Clothing Manipulation and hygiene: with mod assist;with min assist;sitting/lateral leans;sit to/from stand   OT Frequency:  Min 2X/week    Co-evaluation              AM-PAC OT 6 Clicks Daily Activity     Outcome Measure Help from another person eating  meals?: A Little Help from another person taking care of personal grooming?: A Little Help from another person toileting, which includes using toliet, bedpan, or urinal?: A Lot Help from another person bathing (including washing, rinsing, drying)?: A Lot Help from another person to put on and taking off regular upper body clothing?: A Little Help from another person to put on and taking off regular lower  body clothing?: A Lot 6 Click Score: 15   End of Session Equipment Utilized During Treatment: Gait belt;Rolling walker (2 wheels);Other (comment) [facility]) Nurse Communication: Mobility status  Activity Tolerance: Patient tolerated treatment well Patient left: in chair;with call bell/phone within reach;with chair alarm set;with family/visitor present  OT Visit Diagnosis: Unsteadiness on feet (R26.81);Other abnormalities of gait and mobility (R26.89);History of falling (Z91.81);Other symptoms and signs involving cognitive function;Muscle weakness (generalized) (M62.81)                Time: 8968-8890 OT Time Calculation (min): 38 min Charges:  OT General Charges $OT Visit: 1 Visit OT Evaluation $OT Eval Moderate Complexity: 1 Mod OT Treatments $Self Care/Home Management : 8-22 mins $Therapeutic Activity: 8-22 mins    Jacques Karna Loose 05/23/2024, 12:45 PM

## 2024-05-23 NOTE — Evaluation (Signed)
 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   Allergic rhinitis    Dysphonia 08/27/2019   Gastroesophageal reflux disease 03/26/2019   History of malignant carcinoid tumor of large intestine    History of malignant carcinoid tumor of small intestine    Insomnia    Knee osteoarthritis    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   Male erectile dysfunction, unspecified    Parkinson's disease 12/09/2019   Rosacea, unspecified    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 spine imaging showed  Mild to moderate severity anterior osteophyte formation is seen throughout the cervical spine with posterior bony spurring noted at the levels of C3-C4, C5-C6 and C6-C7.    Assessment / Plan / Recommendation  Clinical Impression  Patient greeted trying to get OOB, he was able to be redirected with max cues and assist to reposition him in bed.   Pt is hypophonic -due to his Parkinson's but his voice is not wet. He self fed boluses including thin water approx 3 ounces, 4 ounces apple juice, Ensure, 4 ounces applesauce and 4 graham crackers. Swallow clinically judged to be timely with no change  in vocal quality.   His swallow is clinically timely - notable for occasional eructation.  Approximately 2/3 of way into his snack, pt noted to cough on occasion - x4 T/O remainder of PO.  Immediately before Ensure consumption, he reported I'm full - which was the only novel communication from him, thus suspect he senses need to pause PO due to possible esophageal deficits. Otherwise he is echolalic and states I'm confused .     Patient has H/O laryngospasm, GERD HX and Parkinson's disease.     However based on prior MBS - he may have laryngeal penetration or minimal aspiration of oral residuals mixed with secretions.  Pt's CXR is negative and he did not becomes dyspneic during intake.     Nor does his subtle cough appear uncomfortable for him. Recommend when pt states Im full to allow him to cease po to accommodate his dysphagia.    SLP will follow up for PO tolerance given concerns and also for consideration for RMST as anticipate with support, pt would be able to perform this task and with H/O decreased laryngeal closure- RMST could improve his airway protection.  SLP Visit Diagnosis: Dysphagia, unspecified (R13.10)    Aspiration Risk  Mild aspiration risk    Diet Recommendation Dysphagia 3 (Mech soft);Thin liquid (FINGER FOODS EASIER for him)    Medication Administration: Other (Comment) (may try whole with icecream) Compensations: Slow rate;Small sips/bites Postural Changes: Remain upright for at least 30 minutes after po intake;Seated upright  at 90 degrees  STOP INTAKE WHEN PT STATES HE IS FULL-suspect dysmotility   Other  Recommendations Oral Care Recommendations: Oral care BID     Assistance Recommended at Discharge  Full   Functional Status Assessment Patient has had a recent decline in their functional status and/or demonstrates limited ability to make significant improvements in function in a reasonable and predictable amount of time  Frequency and Duration min 1 x/week  1  week       Prognosis Prognosis for improved oropharyngeal function: Fair Barriers to Reach Goals: Cognitive deficits      Swallow Study   General Date of Onset: 05/23/24 HPI: Pt is a 78 yo male with h/o parkinson's, GERD, dysphagia, dementia, vocal cord atrophy admitted from memory care s/p fall - image showed Acute fracture deformity is seen involving the greater trochanter of  the proximal right femur ; 08/2023 mbs very mild silent aspiration of thin and nectar. spine imaging showed  Mild to moderate severity anterior osteophyte formation is seen throughout the cervical spine with posterior bony spurring noted at the levels of C3-C4, C5-C6 and C6-C7. Type of Study: Bedside Swallow Evaluation Previous Swallow Assessment: MBS 08/2023 trace aspiration of liquid *that appeared mixed with secretions* that cleared with cued cough per study author Diet Prior to this Study: Dysphagia 3 (mechanical soft);Thin liquids (Level 0) Temperature Spikes Noted: No Respiratory Status: Room air History of Recent Intubation: No Behavior/Cognition: Alert;Doesn't follow directions;Requires cueing;Distractible Oral Cavity Assessment: Within Functional Limits Oral Care Completed by SLP: No Oral Cavity - Dentition: Adequate natural dentition Vision: Functional for self-feeding Self-Feeding Abilities: Able to feed self Patient Positioning: Upright in bed Baseline Vocal Quality: Low vocal intensity;Suspected CN X (Vagus) involvement Volitional Cough: Cognitively unable to elicit Volitional Swallow: Unable to elicit    Oral/Motor/Sensory Function Overall Oral Motor/Sensory Function: Other (comment) (pt able to seal lips on spoon and straw, tongue midline, bilateral palatal elevation)   Ice Chips Ice chips: Not tested   Thin Liquid Thin Liquid: Within functional limits Presentation: Cup;Straw;Self Fed    Nectar Thick Nectar Thick Liquid: Impaired Presentation: Self Fed;Straw Pharyngeal Phase Impairments:  Cough - Delayed   Honey Thick Honey Thick Liquid: Not tested   Puree Puree: Within functional limits Presentation: Self Fed;Spoon   Solid     Solid: Impaired Presentation: Self Fed Oral Phase Impairments: Reduced lingual movement/coordination Pharyngeal Phase Impairments: Suspected delayed Swallow      Nicolas Emmie Caldron 05/23/2024,9:14 AM   Madelin POUR, MS [facility] SLP Acute Rehab Services Office [phone]

## 2024-05-23 NOTE — Evaluation (Signed)
 Physical Therapy Evaluation Patient Details Name: Barry Pope MRN: [id number] DOB: 1946-03-11 Today's Date: 05/23/2024  History of Present Illness  Barry Pope is a 78 y.o. male with a past medical history of Parkinson's disease, dementia who was in his usual state of health at his memory care unit where he has been staying for about a week. Per pt's wife, pt has had multiple falls over the past several months as he tends to walk without his walker forgets to use it.  He was moved to the memory care unit about a week ago. Pt was found lying on the floor at 4:30 a.m. morning of admission.  EMS was called and the patient was transported to the emergency department  Clinical Impression  Pt admitted with above diagnosis.  PTA pt was living at [facility] and has been on memory care nite for ~1 week when he fell. Pt's wife previously provided his care along with caregiver assist. Pt's wife reports that amb with rollator however he forgets it, gets up and falls.  Patient may benefit from continued inpatient follow up therapy, <3 hours/day vs return to [facility] if able to provide current level of care.   Pt currently with functional limitations due to the deficits listed below (see PT Problem List). Pt will benefit from acute skilled PT to increase their independence and safety with mobility to allow discharge.           If plan is discharge home, recommend the following: Help with stairs or ramp for entrance;Assist for transportation;A little help with walking and/or transfers;A little help with bathing/dressing/bathroom   Can travel by private vehicle   Yes    Equipment Recommendations None recommended by PT  Recommendations for Other Services       Functional Status Assessment Patient has had a recent decline in their functional status and demonstrates the ability to make significant improvements in function in a reasonable and predictable amount of time.     Precautions /  Restrictions Precautions Precautions: Fall;Other (comment) Precaution/Restrictions Comments: hx of dementia, confusion, Parkinson's, HOH Restrictions Weight Bearing Restrictions Per Provider Order: No      Mobility  Bed Mobility               General bed mobility comments: pt in recliner    Transfers Overall transfer level: Needs assistance Equipment used: Rolling walker (2 wheels) Transfers: Sit to/from Stand Sit to Stand: Min assist, +2 safety/equipment, +2 physical assistance           General transfer comment: multi-modal cues for hand placement and wt shift to standing. light rocking/momentum to stand    Ambulation/Gait Ambulation/Gait assistance: Min assist Gait Distance (Feet): 85 Feet Assistive device: Rolling walker (2 wheels) Gait Pattern/deviations: Step-through pattern, Trunk flexed       General Gait Details: multi-modal cues for step length and RW position. assist to steady and manage RW  Stairs            Wheelchair Mobility     Tilt Bed    Modified Rankin (Stroke Patients Only)       Balance Overall balance assessment: Needs assistance Sitting-balance support: No upper extremity supported, Feet supported Sitting balance-Leahy Scale: Fair     Standing balance support: Bilateral upper extremity supported, During functional activity, Reliant on assistive device for balance Standing balance-Leahy Scale: Poor  Pertinent Vitals/Pain Pain Assessment Pain Assessment: No/denies pain    Home Living Family/patient expects to be discharged to:: Skilled nursing facility                        Prior Function Prior Level of Function : Needs assist;History of Falls (last six months)  Cognitive Assist : Mobility (cognitive);ADLs (cognitive)     Physical Assist : Mobility (physical);ADLs (physical)     Mobility Comments: wife reports pt uses rollater but gets up at night to use  bathroom, forgets to use rollater and falls ADLs Comments: Sip-min A with cues with ADLs/selfcare, balance impaired, cognition impaired     Extremity/Trunk Assessment   Upper Extremity Assessment Upper Extremity Assessment: Generalized weakness;Defer to OT evaluation    Lower Extremity Assessment Lower Extremity Assessment: Generalized weakness    Cervical / Trunk Assessment Cervical / Trunk Assessment: Kyphotic  Communication   Communication Communication: Impaired Factors Affecting Communication: Hearing impaired    Cognition Arousal: Alert Behavior During Therapy: WFL for tasks assessed/performed, Flat affect   PT - Cognitive impairments: History of cognitive impairments, Memory, Initiation                         Following commands: Impaired Following commands impaired: Follows one step commands with increased time, Only follows one step commands consistently     Cueing Cueing Techniques: Verbal cues, Tactile cues, Visual cues     General Comments      Exercises     Assessment/Plan    PT Assessment Patient needs continued PT services  PT Problem List Decreased strength;Decreased activity tolerance;Decreased balance;Decreased knowledge of use of DME;Decreased mobility;Decreased cognition;Decreased safety awareness       PT Treatment Interventions DME instruction;Therapeutic exercise;Gait training;Functional mobility training;Therapeutic activities;Patient/family education;Balance training    PT Goals (Current goals can be found in the Care Plan section)  Acute Rehab PT Goals Patient Stated Goal: less falls PT Goal Formulation: With patient/family Time For Goal Achievement: 06/06/24 Potential to Achieve Goals: Fair    Frequency Min 3X/week     Co-evaluation               AM-PAC PT 6 Clicks Mobility  Outcome Measure Help needed turning from your back to your side while in a flat bed without using bedrails?: A Little Help needed  moving from lying on your back to sitting on the side of a flat bed without using bedrails?: A Little Help needed moving to and from a bed to a chair (including a wheelchair)?: A Little Help needed standing up from a chair using your arms (e.g., wheelchair or bedside chair)?: A Little Help needed to walk in hospital room?: A Little Help needed climbing 3-5 steps with a railing? : A Lot 6 Click Score: 17    End of Session Equipment Utilized During Treatment: Gait belt Activity Tolerance: Patient tolerated treatment well Patient left: with call bell/phone within reach;in chair;with chair alarm set;with family/visitor present Nurse Communication: Mobility status PT Visit Diagnosis: Other abnormalities of gait and mobility (R26.89)    Time: 8799-8786 PT Time Calculation (min) (ACUTE ONLY): 13 min   Charges:   PT Evaluation $PT Eval Low Complexity: 1 Low   PT General Charges $$ ACUTE PT VISIT: 1 Visit         Tahni Porchia, PT  Acute Rehab Dept ([facility]) [phone]  05/23/2024   [facility] 05/23/2024, 2:10 PM

## 2024-05-23 NOTE — Progress Notes (Signed)
 [facility] PROGRESS NOTE   Barry Pope FMW:[id number] DOB: 26-Jul-1946 DOA: 05/22/2024  PCP: Loreli Kins, MD  Brief History:  78 y.o. male with a past medical history of Parkinson's disease, dementia who was in his usual state of health at his memory care unit where he has been staying for about a week.  According to this wife, who was at the bedside, patient has had several falls over the past several months.  Main reason is that he tends to walk without his walker.  He forgets to use it.  He was moved to the memory care unit about a week ago.  He was found lying on the floor at 4:30 on the morning of admission.  He was found to have greater trochanter fracture.  He was hospitalized for further management.    Consultants: Case was discussed with orthopedics over the phone by EDP  Procedures: None    Subjective/Interval History: Patient noted to be awake this morning much more than yesterday.  However still not very communicative due to his dementia.  Does not appear to be in any pain or discomfort.    Assessment/Plan:  Mechanical fall resulting in fracture of the right greater trochanter Patient hospitalized due to pain issue and inability to ambulate in the ED.  PT and OT consulted.  Weightbearing as tolerated. Pain seems to be reasonably well-controlled.  He is on scheduled acetaminophen  and scheduled Robaxin .  Mild rhabdomyolysis CK level was mildly elevated.  Noted to be 961 this morning from 898 yesterday.  To continue with IV fluids.  Recheck labs in the morning.  Hyponatremia Will change IV fluids to normal saline.  Recheck labs in the morning.  Normocytic anemia Mild drop in hemoglobin is dilutional.  Check anemia panel.  Parkinson's disease with dementia Followed by Dr. Bryn with neurology in the outpatient setting.  Continue with his home medications.  He is noted to be on carbidopa  levodopa .  He is also on psychotropic medications for his  dementia.  DVT Prophylaxis: Lovenox  Code Status: DNR based on my discussion with patient's wife Family Communication: No family at bedside this morning Disposition Plan: To be determined.  He is from a memory care unit.  Unclear if he will be able to go back to the same facility.  Status is: Observation The patient remains OBS appropriate and may or may not d/c before 2 midnights.      Medications: Scheduled:  acetaminophen   650 mg Oral TID   carbidopa -levodopa   1 tablet Oral QID   enoxaparin  (LOVENOX ) injection  40 mg Subcutaneous Q24H   feeding supplement  237 mL Oral BID BM   methocarbamol   500 mg Oral TID   senna-docusate  2 tablet Oral BID   sertraline   100 mg Oral Daily   Continuous:  sodium chloride      PRN:HYDROmorphone  (DILAUDID ) injection, ondansetron  **OR** ondansetron  (ZOFRAN ) IV, oxyCODONE , polyethylene glycol  Antibiotics: Anti-infectives (From admission, onward)    Start     Dose/Rate Route Frequency Ordered Stop   05/23/24 1000  doxycycline  (VIBRAMYCIN ) 50 MG capsule 50 mg  Status:  Discontinued        50 mg Oral Daily 05/22/24 1618 05/22/24 1731       Objective:  Vital Signs  Vitals:   05/22/24 2204 05/22/24 2258 05/23/24 0102 05/23/24 0503  BP: 112/84  134/73 (!) 142/77  Pulse: 77  64 62  Resp: 16  18 19   Temp: 98.5 F (36.9 C)  97.6 F (36.4  C) 97.6 F (36.4 C)  TempSrc: Oral  Oral Oral  SpO2: 96%  98% 99%  Weight:  66.5 kg      Intake/Output Summary (Last 24 hours) at 05/23/2024 0950 Last data filed at 05/23/2024 0600 Gross per 24 hour  Intake 1469.76 ml  Output 300 ml  Net 1169.76 ml   Filed Weights   05/22/24 2258  Weight: 66.5 kg    General appearance: Awake alert.  In no distress.  Distracted Resp: Clear to auscultation bilaterally.  Normal effort Cardio: S1-S2 is normal regular.  No S3-S4.  No rubs murmurs or bruit GI: Abdomen is soft.  Nontender nondistended.  Bowel sounds are present normal.  No masses  organomegaly Extremities: No edema.  Slight improvement in mobility noted of lower extremities though he still has significant rigidity from his Parkinson's Neurologic:  No focal neurological deficits.    Lab Results:  Data Reviewed: I have personally reviewed following labs and reports of the imaging studies  CBC: Recent Labs  Lab 05/22/24 1311 05/23/24 0316  WBC 8.7 6.6  NEUTROABS 6.9  --   HGB 12.7* 11.8*  HCT 36.0* 33.8*  MCV 90.2 90.6  PLT 200 183    Basic Metabolic Panel: Recent Labs  Lab 05/22/24 1311 05/23/24 0316  NA 132* 131*  K 3.8 3.8  CL 100 100  CO2 25 22  GLUCOSE 117* 100*  BUN 15 10  CREATININE 0.85 0.79  CALCIUM 8.5* 8.1*    GFR: CrCl cannot be calculated (Unknown ideal weight.).  Liver Function Tests: Recent Labs  Lab 05/22/24 1311  AST 35  ALT 11  ALKPHOS 86  BILITOT 1.0  PROT 6.3*  ALBUMIN 3.7    Cardiac Enzymes: Recent Labs  Lab 05/22/24 1311 05/23/24 0316  CKTOTAL 898* 961*    Thyroid  Function Tests: Recent Labs    05/22/24 1311  TSH 1.610    Recent Results (from the past 240 hours)  Resp panel by RT-PCR (RSV, Flu A&B, Covid) Anterior Nasal Swab     Status: None   Collection Time: 05/22/24  1:11 PM   Specimen: Anterior Nasal Swab  Result Value Ref Range Status   SARS Coronavirus 2 by RT PCR NEGATIVE NEGATIVE Final    Comment: (NOTE) SARS-CoV-2 target nucleic acids are NOT DETECTED.  The SARS-CoV-2 RNA is generally detectable in upper respiratory specimens during the acute phase of infection. The lowest concentration of SARS-CoV-2 viral copies this assay can detect is 138 copies/mL. A negative result does not preclude SARS-Cov-2 infection and should not be used as the sole basis for treatment or other patient management decisions. A negative result may occur with  improper specimen collection/handling, submission of specimen other than nasopharyngeal swab, presence of viral mutation(s) within the areas targeted  by this assay, and inadequate number of viral copies(<138 copies/mL). A negative result must be combined with clinical observations, patient history, and epidemiological information. The expected result is Negative.  Fact Sheet for Patients:  BloggerCourse.com  Fact Sheet for Healthcare Providers:  SeriousBroker.it  This test is no t yet approved or cleared by the [place]  FDA and  has been authorized for detection and/or diagnosis of SARS-CoV-2 by FDA under an Emergency Use Authorization (EUA). This EUA will remain  in effect (meaning this test can be used) for the duration of the COVID-19 declaration under Section 564(b)(1) of the Act, 21 U.S.C.section 360bbb-3(b)(1), unless the authorization is terminated  or revoked sooner.       Influenza A by  PCR NEGATIVE NEGATIVE Final   Influenza B by PCR NEGATIVE NEGATIVE Final    Comment: (NOTE) The Xpert Xpress SARS-CoV-2/FLU/RSV plus assay is intended as an aid in the diagnosis of influenza from Nasopharyngeal swab specimens and should not be used as a sole basis for treatment. Nasal washings and aspirates are unacceptable for Xpert Xpress SARS-CoV-2/FLU/RSV testing.  Fact Sheet for Patients: BloggerCourse.com  Fact Sheet for Healthcare Providers: SeriousBroker.it  This test is not yet approved or cleared by the [place]  FDA and has been authorized for detection and/or diagnosis of SARS-CoV-2 by FDA under an Emergency Use Authorization (EUA). This EUA will remain in effect (meaning this test can be used) for the duration of the COVID-19 declaration under Section 564(b)(1) of the Act, 21 U.S.C. section 360bbb-3(b)(1), unless the authorization is terminated or revoked.     Resp Syncytial Virus by PCR NEGATIVE NEGATIVE Final    Comment: (NOTE) Fact Sheet for Patients: BloggerCourse.com  Fact  Sheet for Healthcare Providers: SeriousBroker.it  This test is not yet approved or cleared by the [place]  FDA and has been authorized for detection and/or diagnosis of SARS-CoV-2 by FDA under an Emergency Use Authorization (EUA). This EUA will remain in effect (meaning this test can be used) for the duration of the COVID-19 declaration under Section 564(b)(1) of the Act, 21 U.S.C. section 360bbb-3(b)(1), unless the authorization is terminated or revoked.  Performed at [facility], [address], [place], [place] [address]       Radiology Studies: CT Hip Right Wo Contrast Result Date: 05/22/2024 CLINICAL DATA:  Status post trauma. EXAM: CT OF THE RIGHT HIP WITHOUT CONTRAST TECHNIQUE: Multidetector CT imaging of the right hip was performed according to the standard protocol. Multiplanar CT image reconstructions were also generated. RADIATION DOSE REDUCTION: This exam was performed according to the departmental dose-optimization program which includes automated exposure control, adjustment of the mA and/or kV according to patient size and/or use of iterative reconstruction technique. COMPARISON:  None Available. FINDINGS: Bones/Joint/Cartilage Acute fracture deformity is seen involving the greater trochanter of the proximal right femur. There is no evidence of dislocation. Degenerative changes are seen involving the right hip in the form of joint space narrowing, subchondral cyst formation, acetabular sclerosis and lateral acetabular bony spurring. Ligaments Suboptimally assessed by CT. Muscles and Tendons Unremarkable Soft tissues Multiple surgical clips are seen within right inguinal region. A large amount of stool is seen within the sigmoid colon. Numerous noninflamed diverticula are also seen within this region. IMPRESSION: 1. Acute fracture of the greater trochanter of the proximal right femur. 2. Degenerative changes of the right hip. 3.  Sigmoid diverticulosis. Electronically Signed   By: Suzen Dials M.D.   On: 05/22/2024 16:10   CT Cervical Spine Wo Contrast Result Date: 05/22/2024 CLINICAL DATA:  Status post trauma. EXAM: CT CERVICAL SPINE WITHOUT CONTRAST TECHNIQUE: Multidetector CT imaging of the cervical spine was performed without intravenous contrast. Multiplanar CT image reconstructions were also generated. RADIATION DOSE REDUCTION: This exam was performed according to the departmental dose-optimization program which includes automated exposure control, adjustment of the mA and/or kV according to patient size and/or use of iterative reconstruction technique. COMPARISON:  None Available. FINDINGS: Alignment: Approximally 2 mm retrolisthesis of the C3 vertebral body is noted on C4. Skull base and vertebrae: No acute fracture. No primary bone lesion or focal pathologic process. Soft tissues and spinal canal: No prevertebral fluid or swelling. No visible canal hematoma. Disc levels: Marked severity endplate sclerosis is seen  at the level of C6-C7 with moderate severity endplate sclerosis present throughout the remainder of the cervical spine. Mild to moderate severity anterior osteophyte formation is seen throughout the cervical spine with posterior bony spurring noted at the levels of C3-C4, C5-C6 and C6-C7. Moderate to marked severity intervertebral disc space narrowing is seen at C6-C7 with mild to moderate severity multilevel intervertebral disc space narrowing present throughout the remainder of the cervical spine. Bilateral moderate to marked severity facet joint hypertrophy is noted. Upper chest: Mild biapical scarring and/or atelectasis is seen, right greater than left. Other: A 1.6 cm heterogeneous, low-attenuation thyroid  nodule is seen within the left lobe of the thyroid  gland. IMPRESSION: 1. No acute fracture or traumatic subluxation of the cervical spine. 2. Moderate to marked severity multilevel degenerative changes, as  described above. 3. 1.6 cm heterogeneous, low-attenuation thyroid  nodule within the left lobe of the thyroid  gland. Correlation with nonemergent thyroid  ultrasound is recommended. Electronically Signed   By: Suzen Dials M.D.   On: 05/22/2024 16:08   CT Head Wo Contrast Result Date: 05/22/2024 CLINICAL DATA:  Status post trauma. EXAM: CT HEAD WITHOUT CONTRAST TECHNIQUE: Contiguous axial images were obtained from the base of the skull through the vertex without intravenous contrast. RADIATION DOSE REDUCTION: This exam was performed according to the departmental dose-optimization program which includes automated exposure control, adjustment of the mA and/or kV according to patient size and/or use of iterative reconstruction technique. COMPARISON:  January 27, 2022 FINDINGS: Brain: There is generalized cerebral atrophy with widening of the extra-axial spaces and ventricular dilatation. There are areas of decreased attenuation within the white matter tracts of the supratentorial brain, consistent with microvascular disease changes. Vascular: No hyperdense vessel or unexpected calcification. Skull: Normal. Negative for fracture or focal lesion. Sinuses/Orbits: No acute finding. Other: None. IMPRESSION: 1. No acute intracranial abnormality. 2. Generalized cerebral atrophy and microvascular disease changes of the supratentorial brain. Electronically Signed   By: Suzen Dials M.D.   On: 05/22/2024 16:04   DG Hip Unilat  With Pelvis 2-3 Views Right Result Date: 05/22/2024 CLINICAL DATA:  Pain EXAM: DG HIP (WITH OR WITHOUT PELVIS) 2-3V RIGHT COMPARISON:  None Available. FINDINGS: Apophyseal avulsion fracture of the right greater trochanter with mild displacement. No other fracture or dislocation. There is no evidence of arthropathy or other focal bone abnormality. Postsurgical changes overlying the right pelvic region likely from prior hernia mesh repair. Degenerative changes of the lower lumbar spine.  IMPRESSION: Isolated greater trochanter fracture of the right femur, possibly avulsion type. Electronically Signed   By: Megan  Zare M.D.   On: 05/22/2024 12:52   DG Chest Port 1 View Result Date: 05/22/2024 CLINICAL DATA:  Fall. EXAM: PORTABLE CHEST 1 VIEW COMPARISON:  01/27/2022. FINDINGS: The heart size and mediastinal contours are within normal limits. Aortic atherosclerosis. No focal consolidation, pleural effusion, or pneumothorax. Moderate degenerative arthropathy of the left glenohumeral joint. No acute osseous abnormality. IMPRESSION: No acute findings in the chest. Electronically Signed   By: Harrietta Sherry M.D.   On: 05/22/2024 12:52       LOS: 0 days   Siris Hoos Verdene  [facility] Pager on [email or web address]  05/23/2024, 9:50 AM

## 2024-05-24 DIAGNOSIS — S72114A Nondisplaced fracture of greater trochanter of right femur, initial encounter for closed fracture: Secondary | ICD-10-CM | POA: Diagnosis not present

## 2024-05-24 LAB — IRON AND TIBC
Iron: 34 ug/dL — ABNORMAL LOW (ref 45–182)
Saturation Ratios: 18 % (ref 17.9–39.5)
TIBC: 190 ug/dL — ABNORMAL LOW (ref 250–450)
UIBC: 156 ug/dL

## 2024-05-24 LAB — CBC
HCT: 32.4 % — ABNORMAL LOW (ref 39.0–52.0)
Hemoglobin: 11.5 g/dL — ABNORMAL LOW (ref 13.0–17.0)
MCH: 31.7 pg (ref 26.0–34.0)
MCHC: 35.5 g/dL (ref 30.0–36.0)
MCV: 89.3 fL (ref 80.0–100.0)
Platelets: 194 K/uL (ref 150–400)
RBC: 3.63 MIL/uL — ABNORMAL LOW (ref 4.22–5.81)
RDW: 11.4 % — ABNORMAL LOW (ref 11.5–15.5)
WBC: 6.9 K/uL (ref 4.0–10.5)
nRBC: 0 % (ref 0.0–0.2)

## 2024-05-24 LAB — BASIC METABOLIC PANEL WITH GFR
Anion gap: 7 (ref 5–15)
BUN: 11 mg/dL (ref 8–23)
CO2: 23 mmol/L (ref 22–32)
Calcium: 8 mg/dL — ABNORMAL LOW (ref 8.9–10.3)
Chloride: 103 mmol/L (ref 98–111)
Creatinine, Ser: 0.76 mg/dL (ref 0.61–1.24)
GFR, Estimated: >60 mL/min (ref >60)
Glucose, Bld: 107 mg/dL — ABNORMAL HIGH (ref 70–99)
Potassium: 3.7 mmol/L (ref 3.5–5.1)
Sodium: 133 mmol/L — ABNORMAL LOW (ref 135–145)

## 2024-05-24 LAB — VITAMIN B12: Vitamin B-12: 243 pg/mL (ref 180–914)

## 2024-05-24 LAB — RETICULOCYTES
Immature Retic Fract: 10.8 % (ref 2.3–15.9)
RBC.: 3.56 MIL/uL — ABNORMAL LOW (ref 4.22–5.81)
Retic Count, Absolute: 48.8 K/uL (ref 19.0–186.0)
Retic Ct Pct: 1.4 % (ref 0.4–3.1)

## 2024-05-24 LAB — FOLATE: Folate: 12.2 ng/mL (ref >5.9)

## 2024-05-24 LAB — MAGNESIUM: Magnesium: 1.8 mg/dL (ref 1.7–2.4)

## 2024-05-24 LAB — FERRITIN: Ferritin: 172 ng/mL (ref 24–336)

## 2024-05-24 LAB — CK: Total CK: 453 U/L — ABNORMAL HIGH (ref 49–397)

## 2024-05-24 MED ORDER — VITAMIN B-12 1000 MCG PO TABS
1000.0000 ug | ORAL_TABLET | Freq: Every day | ORAL | Status: DC
  Administered 2024-05-24 – 2024-05-27 (×4): 1000 ug via ORAL
  Filled 2024-05-24 (×4): qty 1

## 2024-05-24 MED ORDER — ACETAMINOPHEN 325 MG PO TABS
650.0000 mg | ORAL_TABLET | Freq: Three times a day (TID) | ORAL | Status: AC
Start: 2024-05-24 — End: ?

## 2024-05-24 MED ORDER — SENNOSIDES-DOCUSATE SODIUM 8.6-50 MG PO TABS: 2.0000 | ORAL_TABLET | Freq: Two times a day (BID) | ORAL | 0 refills | Status: AC

## 2024-05-24 MED ORDER — METHOCARBAMOL 500 MG PO TABS: ORAL_TABLET | ORAL | 0 refills | Status: AC

## 2024-05-24 MED ORDER — ENSURE PLUS HIGH PROTEIN PO LIQD: 237.0000 mL | Freq: Two times a day (BID) | ORAL | Status: AC

## 2024-05-24 MED ORDER — ALPRAZOLAM 0.5 MG PO TABS: 0.5000 mg | ORAL_TABLET | Freq: Two times a day (BID) | ORAL | 1 refills | Status: AC | PRN

## 2024-05-24 MED ORDER — POLYETHYLENE GLYCOL 3350 17 G PO PACK: 17.0000 g | PACK | Freq: Every day | ORAL | 0 refills | Status: AC | PRN

## 2024-05-24 MED ORDER — OXYCODONE HCL 5 MG PO TABS: 5.0000 mg | ORAL_TABLET | Freq: Four times a day (QID) | ORAL | 0 refills | Status: DC | PRN

## 2024-05-24 MED ORDER — CYANOCOBALAMIN 1000 MCG PO TABS: 1000.0000 ug | ORAL_TABLET | Freq: Every day | ORAL | 3 refills | Status: AC

## 2024-05-24 NOTE — Care Management Obs Status (Signed)
 MEDICARE OBSERVATION STATUS NOTIFICATION   Patient Details  Name: GRADIE OHM MRN: [id number] Date of Birth: 04-17-1946   Medicare Observation Status Notification Given:  Yes    Alfonse JONELLE Rex, RN 05/24/2024, 11:03 AM

## 2024-05-24 NOTE — Plan of Care (Signed)

## 2024-05-24 NOTE — TOC PASRR Note (Signed)
 30 Day PASRR Note   Patient Details  Name: Barry Pope Date of Birth: 11-Nov-1945   Transition of Care [facility]) CM/SW Contact:    Alfonse JONELLE Rex, RN Phone Number: 05/24/2024, 11:49 AM  To Whom It May Concern:  Please be advised that this patient will require a short-term nursing home stay - anticipated 30 days or less for rehabilitation and strengthening.   The plan is for return home.

## 2024-05-24 NOTE — Discharge Summary (Signed)
 [facility]  Physician Discharge Summary   Patient ID: Barry Pope MRN: [id number] DOB/AGE: 1946-07-17 78 y.o.  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 male with a past medical history of Parkinson's disease, dementia who was in his usual state of health at his memory care unit where he has been staying for about a week.  According to this wife, who was at the bedside, patient has had several falls over the past several months.  Main reason is that he tends to walk without his walker.  He forgets to use it.  He was moved to the memory care unit about a week ago.  He was found lying on the floor at 4:30 on the morning of admission.  He was found to have greater trochanter fracture.  He was hospitalized for further management.     Consultants: Case was discussed with orthopedics over the phone by EDP   Procedures: None  HOSPITAL COURSE:   Mechanical fall resulting in fracture of the right greater trochanter Patient hospitalized due to pain issue and inability to ambulate in the ED.  PT and OT consulted.  Weightbearing as tolerated. Pain seems to be reasonably well-controlled.  He is on scheduled acetaminophen  and scheduled Robaxin . Worked with physical therapy.  SNF is recommended for short-term rehab.   Mild rhabdomyolysis CK level was mildly elevated.  Improved with IV hydration.  Okay to stop IV fluids.     Hyponatremia Sodium level has improved.   Normocytic anemia/B12  deficiency Mild drop in hemoglobin is dilutional.  Stable this morning.  Anemia panel suggest B12 deficiency which will be supplemented.   Parkinson's disease with dementia Followed by Dr. Evonnie with neurology in the outpatient setting.  Continue with his home medications.  He is noted to be on carbidopa  levodopa .  He is also on psychotropic medications for his dementia.   Discussed with his wife.  She would prefer for him to go back to [facility] if they are able to take care of him in his current condition.  Communicated to TOC to follow-up.  Remains stable for discharge.   PERTINENT LABS:  The results of significant diagnostics from this hospitalization (including imaging, microbiology, ancillary and laboratory) are listed below for reference.    Microbiology: Recent Results (from the past 240 hours)  Resp panel by RT-PCR (RSV, Flu A&B, Covid) Anterior Nasal Swab     Status: None   Collection Time: 05/22/24  1:11 PM   Specimen: Anterior Nasal Swab  Result Value Ref Range Status   SARS Coronavirus 2 by RT PCR NEGATIVE NEGATIVE Final    Comment: (NOTE) SARS-CoV-2 target nucleic acids are NOT DETECTED.  The SARS-CoV-2 RNA is generally detectable in upper respiratory specimens during the acute phase of infection. The lowest concentration of SARS-CoV-2 viral copies this assay can detect is 138 copies/mL. A negative result does not preclude SARS-Cov-2 infection and should not be used as the sole basis for treatment or other patient management decisions. A negative result may occur with  improper  specimen collection/handling, submission of specimen other than nasopharyngeal swab, presence of viral mutation(s) within the areas targeted by this assay, and inadequate number of viral copies(<138 copies/mL). A negative result must be combined with clinical observations, patient history, and epidemiological information. The expected result is Negative.  Fact Sheet for Patients:   BloggerCourse.com  Fact Sheet for Healthcare Providers:  SeriousBroker.it  This test is no t yet approved or cleared by the [place]  FDA and  has been authorized for detection and/or diagnosis of SARS-CoV-2 by FDA under an Emergency Use Authorization (EUA). This EUA will remain  in effect (meaning this test can be used) for the duration of the COVID-19 declaration under Section 564(b)(1) of the Act, 21 U.S.C.section 360bbb-3(b)(1), unless the authorization is terminated  or revoked sooner.       Influenza A by PCR NEGATIVE NEGATIVE Final   Influenza B by PCR NEGATIVE NEGATIVE Final    Comment: (NOTE) The Xpert Xpress SARS-CoV-2/FLU/RSV plus assay is intended as an aid in the diagnosis of influenza from Nasopharyngeal swab specimens and should not be used as a sole basis for treatment. Nasal washings and aspirates are unacceptable for Xpert Xpress SARS-CoV-2/FLU/RSV testing.  Fact Sheet for Patients: BloggerCourse.com  Fact Sheet for Healthcare Providers: SeriousBroker.it  This test is not yet approved or cleared by the [place]  FDA and has been authorized for detection and/or diagnosis of SARS-CoV-2 by FDA under an Emergency Use Authorization (EUA). This EUA will remain in effect (meaning this test can be used) for the duration of the COVID-19 declaration under Section 564(b)(1) of the Act, 21 U.S.C. section 360bbb-3(b)(1), unless the authorization is terminated or revoked.     Resp Syncytial Virus by PCR NEGATIVE NEGATIVE Final    Comment: (NOTE) Fact Sheet for Patients: BloggerCourse.com  Fact Sheet for Healthcare Providers: SeriousBroker.it  This test is not yet approved or cleared by the [place]  FDA and has been authorized for detection and/or diagnosis of SARS-CoV-2 by FDA under an Emergency Use  Authorization (EUA). This EUA will remain in effect (meaning this test can be used) for the duration of the COVID-19 declaration under Section 564(b)(1) of the Act, 21 U.S.C. section 360bbb-3(b)(1), unless the authorization is terminated or revoked.  Performed at [facility], [address] [place], [place] [address]      Labs:   Basic Metabolic Panel: Recent Labs  Lab 05/22/24 1311 05/23/24 0316 05/24/24 0325  NA 132* 131* 133*  K 3.8 3.8 3.7  CL 100 100 103  CO2 25 22 23   GLUCOSE 117* 100* 107*  BUN 15 10 11   CREATININE 0.85 0.79 0.76  CALCIUM 8.5* 8.1* 8.0*  MG  --   --  1.8   Liver Function Tests: Recent Labs  Lab 05/22/24 1311  AST 35  ALT 11  ALKPHOS 86  BILITOT 1.0  PROT 6.3*  ALBUMIN 3.7   CBC: Recent Labs  Lab 05/22/24 1311 05/23/24 0316 05/24/24 0325  WBC 8.7 6.6 6.9  NEUTROABS 6.9  --   --   HGB 12.7* 11.8* 11.5*  HCT 36.0* 33.8* 32.4*  MCV 90.2 90.6 89.3  PLT 200 183 194   Cardiac Enzymes: Recent Labs  Lab 05/22/24 1311 05/23/24 0316 05/24/24 0325  CKTOTAL 898* 961* 453*    IMAGING STUDIES CT Hip Right Wo Contrast Result Date: 05/22/2024 CLINICAL DATA:  Status post trauma. EXAM: CT OF THE RIGHT HIP WITHOUT CONTRAST TECHNIQUE: Multidetector CT imaging of the right hip was performed according to  the standard protocol. Multiplanar CT image reconstructions were also generated. RADIATION DOSE REDUCTION: This exam was performed according to the departmental dose-optimization program which includes automated exposure control, adjustment of the mA and/or kV according to patient size and/or use of iterative reconstruction technique. COMPARISON:  None Available. FINDINGS: Bones/Joint/Cartilage Acute fracture deformity is seen involving the greater trochanter of the proximal right femur. There is no evidence of dislocation. Degenerative changes are seen involving the right hip in the form of joint space narrowing, subchondral cyst  formation, acetabular sclerosis and lateral acetabular bony spurring. Ligaments Suboptimally assessed by CT. Muscles and Tendons Unremarkable Soft tissues Multiple surgical clips are seen within right inguinal region. A large amount of stool is seen within the sigmoid colon. Numerous noninflamed diverticula are also seen within this region. IMPRESSION: 1. Acute fracture of the greater trochanter of the proximal right femur. 2. Degenerative changes of the right hip. 3. Sigmoid diverticulosis. Electronically Signed   By: Suzen Dials M.D.   On: 05/22/2024 16:10   CT Cervical Spine Wo Contrast Result Date: 05/22/2024 CLINICAL DATA:  Status post trauma. EXAM: CT CERVICAL SPINE WITHOUT CONTRAST TECHNIQUE: Multidetector CT imaging of the cervical spine was performed without intravenous contrast. Multiplanar CT image reconstructions were also generated. RADIATION DOSE REDUCTION: This exam was performed according to the departmental dose-optimization program which includes automated exposure control, adjustment of the mA and/or kV according to patient size and/or use of iterative reconstruction technique. COMPARISON:  None Available. FINDINGS: Alignment: Approximally 2 mm retrolisthesis of the C3 vertebral body is noted on C4. Skull base and vertebrae: No acute fracture. No primary bone lesion or focal pathologic process. Soft tissues and spinal canal: No prevertebral fluid or swelling. No visible canal hematoma. Disc levels: Marked severity endplate sclerosis is seen at the level of C6-C7 with moderate severity endplate sclerosis present throughout the remainder of the cervical spine. Mild to moderate severity anterior osteophyte formation is seen throughout the cervical spine with posterior bony spurring noted at the levels of C3-C4, C5-C6 and C6-C7. Moderate to marked severity intervertebral disc space narrowing is seen at C6-C7 with mild to moderate severity multilevel intervertebral disc space narrowing present  throughout the remainder of the cervical spine. Bilateral moderate to marked severity facet joint hypertrophy is noted. Upper chest: Mild biapical scarring and/or atelectasis is seen, right greater than left. Other: A 1.6 cm heterogeneous, low-attenuation thyroid  nodule is seen within the left lobe of the thyroid  gland. IMPRESSION: 1. No acute fracture or traumatic subluxation of the cervical spine. 2. Moderate to marked severity multilevel degenerative changes, as described above. 3. 1.6 cm heterogeneous, low-attenuation thyroid  nodule within the left lobe of the thyroid  gland. Correlation with nonemergent thyroid  ultrasound is recommended. Electronically Signed   By: Suzen Dials M.D.   On: 05/22/2024 16:08   CT Head Wo Contrast Result Date: 05/22/2024 CLINICAL DATA:  Status post trauma. EXAM: CT HEAD WITHOUT CONTRAST TECHNIQUE: Contiguous axial images were obtained from the base of the skull through the vertex without intravenous contrast. RADIATION DOSE REDUCTION: This exam was performed according to the departmental dose-optimization program which includes automated exposure control, adjustment of the mA and/or kV according to patient size and/or use of iterative reconstruction technique. COMPARISON:  January 27, 2022 FINDINGS: Brain: There is generalized cerebral atrophy with widening of the extra-axial spaces and ventricular dilatation. There are areas of decreased attenuation within the white matter tracts of the supratentorial brain, consistent with microvascular disease changes. Vascular: No hyperdense vessel or unexpected calcification.  Skull: Normal. Negative for fracture or focal lesion. Sinuses/Orbits: No acute finding. Other: None. IMPRESSION: 1. No acute intracranial abnormality. 2. Generalized cerebral atrophy and microvascular disease changes of the supratentorial brain. Electronically Signed   By: Suzen Dials M.D.   On: 05/22/2024 16:04   DG Hip Unilat  With Pelvis 2-3 Views  Right Result Date: 05/22/2024 CLINICAL DATA:  Pain EXAM: DG HIP (WITH OR WITHOUT PELVIS) 2-3V RIGHT COMPARISON:  None Available. FINDINGS: Apophyseal avulsion fracture of the right greater trochanter with mild displacement. No other fracture or dislocation. There is no evidence of arthropathy or other focal bone abnormality. Postsurgical changes overlying the right pelvic region likely from prior hernia mesh repair. Degenerative changes of the lower lumbar spine. IMPRESSION: Isolated greater trochanter fracture of the right femur, possibly avulsion type. Electronically Signed   By: Megan  Zare M.D.   On: 05/22/2024 12:52   DG Chest Port 1 View Result Date: 05/22/2024 CLINICAL DATA:  Fall. EXAM: PORTABLE CHEST 1 VIEW COMPARISON:  01/27/2022. FINDINGS: The heart size and mediastinal contours are within normal limits. Aortic atherosclerosis. No focal consolidation, pleural effusion, or pneumothorax. Moderate degenerative arthropathy of the left glenohumeral joint. No acute osseous abnormality. IMPRESSION: No acute findings in the chest. Electronically Signed   By: Harrietta Sherry M.D.   On: 05/22/2024 12:52    DISCHARGE EXAMINATION: Vitals:   05/23/24 1003 05/23/24 1328 05/23/24 1957 05/24/24 0534  BP: 133/72 (!) 173/95 (!) 141/77 (!) 163/91  Pulse: 73 88 73 72  Resp: 18 17 17 17   Temp:  97.7 F (36.5 C) 98.5 F (36.9 C) (!) 97.5 F (36.4 C)  TempSrc:   Oral   SpO2: 97% 99% 98% 99%  Weight:      Height: 5' 9.5 (1.765 m)      General appearance: Awake alert.  In no distress.  Distracted Resp: Clear to auscultation bilaterally.  Normal effort Cardio: S1-S2 is normal regular.  No S3-S4.  No rubs murmurs or bruit GI: Abdomen is soft.  Nontender nondistended.  Bowel sounds are present normal.  No masses organomegaly Extremities: Improved mobility of the right lower extremity noted. No obvious focal neurological deficits.   DISPOSITION: SNF versus back to his memory care unit  Discharge  Instructions     Call MD for:  difficulty breathing, headache or visual disturbances   Complete by: As directed    Call MD for:  extreme fatigue   Complete by: As directed    Call MD for:  persistant dizziness or light-headedness   Complete by: As directed    Call MD for:  persistant nausea and vomiting   Complete by: As directed    Call MD for:  severe uncontrolled pain   Complete by: As directed    Call MD for:  temperature >100.4   Complete by: As directed    Diet - low sodium heart healthy   Complete by: As directed    Discharge instructions   Complete by: As directed    Please follow-up with primary care provider.  B12 level will need to be rechecked in 4 weeks.  You were cared for by a hospitalist during your hospital stay. If you have any questions about your discharge medications or the care you received while you were in the hospital after you are discharged, you can call the unit and asked to speak with the hospitalist on call if the hospitalist that took care of you is not available. Once you are discharged, your primary  care physician will handle any further medical issues. Please note that NO REFILLS for any discharge medications will be authorized once you are discharged, as it is imperative that you return to your primary care physician (or establish a relationship with a primary care physician if you do not have one) for your aftercare needs so that they can reassess your need for medications and monitor your lab values. If you do not have a primary care physician, you can call [phone] for a physician referral.   Increase activity slowly   Complete by: As directed          Allergies as of 05/24/2024   No Known Allergies      Medication List     STOP taking these medications    acyclovir  200 MG capsule Commonly known as: ZOVIRAX    clonazePAM  0.5 MG tablet Commonly known as: KlonoPIN    Comirnaty  syringe Generic drug: COVID-19 mRNA vaccine (Pfizer)    doxycycline  50 MG capsule Commonly known as: VIBRAMYCIN    Nuplazid  34 MG Caps Generic drug: Pimavanserin  Tartrate       TAKE these medications    acetaminophen  325 MG tablet Commonly known as: TYLENOL  Take 2 tablets (650 mg total) by mouth 3 (three) times daily.   ALPRAZolam  0.5 MG tablet Commonly known as: XANAX  Take 1 tablet (0.5 mg total) by mouth 2 (two) times daily as needed for panic. What changed:  when to take this reasons to take this   cyanocobalamin  1000 MCG tablet Take 1 tablet (1,000 mcg total) by mouth daily. Start taking on: May 25, 2024   feeding supplement Liqd Take 237 mLs by mouth 2 (two) times daily between meals.   methocarbamol  500 MG tablet Commonly known as: ROBAXIN  Take 1 tablet 3 times a day on scheduled basis for 5 days and then 3 times a day as needed for muscle spasms   oxyCODONE  5 MG immediate release tablet Commonly known as: Oxy IR/ROXICODONE  Take 1 tablet (5 mg total) by mouth every 6 (six) hours as needed for severe pain (pain score 7-10).   polyethylene glycol 17 g packet Commonly known as: MIRALAX  / GLYCOLAX  Take 17 g by mouth daily as needed for moderate constipation.   Rytary  61.25-245 MG Cpcr Generic drug: Carbidopa -Levodopa  ER Take 1 capsule by mouth 4 times per day at 7am/11am/4pm/bedtime. What changed:  how much to take how to take this when to take this additional instructions   senna-docusate 8.6-50 MG tablet Commonly known as: Senokot-S Take 2 tablets by mouth 2 (two) times daily.   sertraline  100 MG tablet Commonly known as: ZOLOFT  Take 1 tablet (100 mg total) by mouth daily.          Follow-up Information     Loreli Kins, MD. Schedule an appointment as soon as possible for a visit in 1 week(s).   Specialty: Family Medicine Why: post hospitalization follow up Contact information: [address] [place] [address] [phone]                 TOTAL DISCHARGE TIME:  35 minutes  Avielle Imbert Verdene  [facility] Pager on [email or web address]  05/24/2024, 10:33 AM

## 2024-05-24 NOTE — Plan of Care (Signed)
  Problem: Health Behavior/Discharge Planning: Goal: Ability to manage health-related needs will improve 05/24/2024 0212 by Lenon Charmaine LABOR, RN Outcome: Progressing 05/24/2024 0158 by Lenon Charmaine LABOR, RN Outcome: Progressing   Problem: Clinical Measurements: Goal: Ability to maintain clinical measurements within normal limits will improve 05/24/2024 0212 by Lenon Charmaine LABOR, RN Outcome: Progressing 05/24/2024 0158 by Lenon Charmaine LABOR, RN Outcome: Progressing Goal: Will remain free from infection 05/24/2024 0212 by Lenon Charmaine LABOR, RN Outcome: Progressing 05/24/2024 0158 by Lenon Charmaine LABOR, RN Outcome: Progressing Goal: Diagnostic test results will improve 05/24/2024 0212 by Lenon Charmaine LABOR, RN Outcome: Progressing 05/24/2024 0158 by Lenon Charmaine LABOR, RN Outcome: Progressing Goal: Respiratory complications will improve 05/24/2024 0212 by Lenon Charmaine LABOR, RN Outcome: Progressing 05/24/2024 0158 by Lenon Charmaine LABOR, RN Outcome: Progressing Goal: Cardiovascular complication will be avoided 05/24/2024 0212 by Lenon Charmaine LABOR, RN Outcome: Progressing 05/24/2024 0158 by Lenon Charmaine LABOR, RN Outcome: Progressing   Problem: Activity: Goal: Risk for activity intolerance will decrease 05/24/2024 0212 by Lenon Charmaine LABOR, RN Outcome: Progressing 05/24/2024 0158 by Lenon Charmaine LABOR, RN Outcome: Progressing   Problem: Nutrition: Goal: Adequate nutrition will be maintained 05/24/2024 0212 by Lenon Charmaine LABOR, RN Outcome: Progressing 05/24/2024 0158 by Lenon Charmaine LABOR, RN Outcome: Progressing   Problem: Coping: Goal: Level of anxiety will decrease 05/24/2024 0212 by Lenon Charmaine LABOR, RN Outcome: Progressing 05/24/2024 0158 by Lenon Charmaine LABOR, RN Outcome: Progressing   Problem: Elimination: Goal: Will not experience complications related to bowel motility 05/24/2024 0212 by Lenon Charmaine LABOR, RN Outcome:  Progressing 05/24/2024 0158 by Lenon Charmaine LABOR, RN Outcome: Progressing Goal: Will not experience complications related to urinary retention 05/24/2024 0212 by Lenon Charmaine LABOR, RN Outcome: Progressing 05/24/2024 0158 by Lenon Charmaine LABOR, RN Outcome: Progressing   Problem: Pain Managment: Goal: General experience of comfort will improve and/or be controlled 05/24/2024 0212 by Lenon Charmaine LABOR, RN Outcome: Progressing 05/24/2024 0158 by Lenon Charmaine LABOR, RN Outcome: Progressing   Problem: Safety: Goal: Ability to remain free from injury will improve 05/24/2024 0212 by Lenon Charmaine LABOR, RN Outcome: Progressing 05/24/2024 0158 by Lenon Charmaine LABOR, RN Outcome: Progressing   Problem: Skin Integrity: Goal: Risk for impaired skin integrity will decrease 05/24/2024 0212 by Lenon Charmaine LABOR, RN Outcome: Progressing 05/24/2024 0158 by Lenon Charmaine LABOR, RN Outcome: Progressing

## 2024-05-24 NOTE — [place] FL2 (Signed)
 [place]  MEDICAID FL2 LEVEL OF CARE FORM     IDENTIFICATION  Patient Name: Barry Pope Birthdate: 1945/10/16 Sex: male Admission Date (Current Location): 05/22/2024  [facility] and [place] Number:  Producer, television/film/video and Address:  [facility],  [address] 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   Mesenteric mass 07/08/2022   History of malignant carcinoid tumor 07/08/2022   Rhabdomyolysis 01/30/2022   Acute metabolic encephalopathy 01/28/2022   History of malignant carcinoid tumor of small intestine 01/27/2022   Insomnia 01/27/2022   Mild cognitive impairment 01/27/2022   Abnormal CXR 01/27/2022   Abnormal CT scan 01/27/2022   Hyponatremia 01/27/2022   Transaminitis 01/27/2022   Parkinson's disease 12/09/2019   Allergies    Dysphonia 08/27/2019   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   Gastroesophageal reflux disease 03/26/2019   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    Orientation RESPIRATION BLADDER Height & Weight     Self  Normal Incontinent, External catheter Weight: 66.5 kg Height:  5' 9.5 (176.5 cm)  BEHAVIORAL SYMPTOMS/MOOD NEUROLOGICAL BOWEL NUTRITION STATUS      Incontinent Diet (Dysphagia 3)  AMBULATORY STATUS COMMUNICATION OF NEEDS Skin   Limited Assist Verbally Normal                       Personal Care Assistance Level of Assistance  Bathing, Feeding, Dressing  Bathing Assistance: Limited assistance Feeding assistance: Limited assistance Dressing Assistance: Limited assistance     Functional Limitations Info  Sight, Hearing, Speech Sight Info: Adequate Hearing Info: Adequate Speech Info: Adequate    SPECIAL CARE FACTORS FREQUENCY  PT (By licensed PT), OT (By licensed OT)     PT Frequency: 5x/wk OT Frequency: 5x/wk            Contractures Contractures Info: Not present    Additional Factors Info  Code Status, Allergies, Psychotropic Code Status Info: DNR Allergies Info: NKA Psychotropic Info: Zoloft  100mg  po daily         Current Medications (05/24/2024):  This is the current hospital active medication list Current Facility-Administered Medications  Medication Dose Route Frequency Provider Last Rate Last Admin   acetaminophen  (TYLENOL ) tablet 650 mg  650 mg Oral TID Krishnan, Gokul, MD   650 mg at 05/24/24 0920   carbidopa -levodopa  (SINEMET  CR) 50-200 MG per tablet controlled release 1 tablet  1 tablet Oral QID Krishnan, Gokul, MD   1 tablet at 05/24/24 9081   cyanocobalamin  (VITAMIN B12) tablet 1,000 mcg  1,000 mcg Oral Daily Krishnan, Gokul, MD   1,000 mcg at 05/24/24 0919   enoxaparin  (LOVENOX ) injection 40 mg  40 mg Subcutaneous Q24H Krishnan, Gokul, MD   40 mg at 05/23/24 2110   feeding supplement (ENSURE PLUS HIGH PROTEIN) liquid 237 mL  237 mL Oral BID BM Krishnan, Gokul, MD   237 mL at 05/24/24 0919   HYDROmorphone  (DILAUDID ) injection 0.5-1 mg  0.5-1 mg  Intravenous Q2H PRN Krishnan, Gokul, MD       methocarbamol  (ROBAXIN ) tablet 500 mg  500 mg Oral TID Krishnan, Gokul, MD   500 mg at 05/24/24 0919   ondansetron  (ZOFRAN ) tablet 4 mg  4 mg Oral Q6H PRN Krishnan, Gokul, MD       Or   ondansetron  (ZOFRAN ) injection 4 mg  4 mg Intravenous Q6H PRN Krishnan, Gokul, MD       Oral care mouth rinse  15 mL Mouth Rinse PRN Krishnan, Gokul, MD       oxyCODONE  (Oxy IR/ROXICODONE ) immediate release tablet 5 mg  5 mg Oral Q4H PRN  Krishnan, Gokul, MD       polyethylene glycol (MIRALAX  / GLYCOLAX ) packet 17 g  17 g Oral Daily PRN Krishnan, Gokul, MD       senna-docusate (Senokot-S) tablet 2 tablet  2 tablet Oral BID Krishnan, Gokul, MD   2 tablet at 05/23/24 1005   sertraline  (ZOLOFT ) tablet 100 mg  100 mg Oral Daily Krishnan, Gokul, MD   100 mg at 05/24/24 9081     Discharge Medications: Please see discharge summary for a list of discharge medications.  Relevant Imaging Results:  Relevant Lab Results:   Additional Information SSN: [id number]  Alfonse JONELLE Rex, RN

## 2024-05-24 NOTE — TOC Initial Note (Addendum)
 Transition of Care [facility]) - Initial/Assessment Note    Patient Details  Name: Barry Pope MRN: [id number] Date of Birth: 1945/10/17  Transition of Care [facility]) CM/SW Contact:    Alfonse JONELLE Rex, RN Phone Number: 05/24/2024, 11:51 AM  Clinical Narrative:  [facility] Consult for SNF Placement. Teams chat received from attending requesting contact with [facility] to inquire at what level of care they can accept patient back to Memory Care unit.  Call to [facility], sw Lesley Lewis in admissions, confirmed patient is a resident in [facility] Unit, reports he was independent with ambulation with uses of RW most times but able to ambulate without AD at times. Lesley reports they prefer patient to admit to short term rehab/SNF prior to return to [facility]  NCM met with patient's spouse at bedside, introduced self and role of TOC/NCM, spouse agreeable to short term rehab/SNF, no preference. FL2 updated, Level 2 PASSR pending, faxed out for bed offers. MOON reviewed and completed by spouse   Expected Discharge Plan: Skilled Nursing Facility Barriers to Discharge: Continued Medical Work up   Patient Goals and CMS Choice Patient states their goals for this hospitalization and ongoing recovery are:: return to [facility] Unit after stay at [facility] CMS Medicare.gov Compare Post Acute Care list provided to:: Patient Represenative (must comment) (Barry Pope (Spouse)  [phone] (Mobile)) Choice offered to / list presented to : Spouse Rouse ownership interest in [facility].provided to:: Spouse    Expected Discharge Plan and Services       Living arrangements for the past 2 months: Skilled Nursing Facility ([facility])                                      Prior Living Arrangements/Services Living arrangements for the past 2 months: Skilled Nursing Facility ([facility]) Lives with:: Self Patient language and need for  interpreter reviewed:: Yes Do you feel safe going back to the place where you live?: Yes      Need for Family Participation in Patient Care: Yes (Comment) Care giver support system in place?: Yes (comment) Current home services: DME (RW) Criminal Activity/Legal Involvement Pertinent to Current Situation/Hospitalization: No - Comment as needed  Activities of Daily Living   ADL Screening (condition at time of admission) Independently performs ADLs?: No Does the patient have a NEW difficulty with bathing/dressing/toileting/self-feeding that is expected to last >3 days?: No Does the patient have a NEW difficulty with getting in/out of bed, walking, or climbing stairs that is expected to last >3 days?: No Does the patient have a NEW difficulty with communication that is expected to last >3 days?: No Is the patient deaf or have difficulty hearing?: Yes Does the patient have difficulty seeing, even when wearing glasses/contacts?: No Does the patient have difficulty concentrating, remembering, or making decisions?: Yes  Permission Sought/Granted                  Emotional Assessment Appearance:: Appears stated age Attitude/Demeanor/Rapport: Unable to Assess Affect (typically observed): Unable to Assess Orientation: : Oriented to Self Alcohol / Substance Use: Not Applicable Psych Involvement: No (comment)  Admission diagnosis:  Elevated CK [R74.8] Greater trochanter fracture (HCC) [S72.113A] Injury of head, initial encounter [S09.90XA] Fall, initial encounter [W19.XXXA] Closed fracture of right hip, initial encounter [facility]) [S72.001A] Patient Active Problem List   Diagnosis Date Noted   Greater trochanter fracture (HCC)  05/22/2024   Acute pain 05/22/2024   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   Mesenteric mass 07/08/2022   History of malignant carcinoid tumor 07/08/2022   Rhabdomyolysis 01/30/2022   Acute metabolic encephalopathy 01/28/2022   History of malignant  carcinoid tumor of small intestine 01/27/2022   Insomnia 01/27/2022   Mild cognitive impairment 01/27/2022   Abnormal CXR 01/27/2022   Abnormal CT scan 01/27/2022   Hyponatremia 01/27/2022   Transaminitis 01/27/2022   Parkinson's disease 12/09/2019   Allergies    Dysphonia 08/27/2019   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 [place] [address] Phone: [phone] Fax: [phone]     Social Drivers of Health (SDOH) Social History: SDOH Screenings   Food Insecurity: No Food Insecurity (05/22/2024)  Housing: Low Risk  (05/22/2024)  Transportation Needs: No Transportation Needs (05/22/2024)  Utilities: Not At Risk (05/22/2024)  Social Connections: Patient Unable To Answer (05/22/2024)  Tobacco Use: Low Risk  (05/22/2024)   SDOH Interventions:     Readmission Risk Interventions     No data to display

## 2024-05-25 DIAGNOSIS — D638 Anemia in other chronic diseases classified elsewhere: Secondary | ICD-10-CM

## 2024-05-25 DIAGNOSIS — T796XXS Traumatic ischemia of muscle, sequela: Secondary | ICD-10-CM

## 2024-05-25 DIAGNOSIS — M6282 Rhabdomyolysis: Secondary | ICD-10-CM | POA: Diagnosis not present

## 2024-05-25 DIAGNOSIS — S72111S Displaced fracture of greater trochanter of right femur, sequela: Secondary | ICD-10-CM | POA: Diagnosis not present

## 2024-05-25 DIAGNOSIS — S72111D Displaced fracture of greater trochanter of right femur, subsequent encounter for closed fracture with routine healing: Secondary | ICD-10-CM | POA: Diagnosis not present

## 2024-05-25 DIAGNOSIS — E871 Hypo-osmolality and hyponatremia: Secondary | ICD-10-CM | POA: Diagnosis not present

## 2024-05-25 DIAGNOSIS — G20A1 Parkinson's disease without dyskinesia, without mention of fluctuations: Secondary | ICD-10-CM | POA: Diagnosis not present

## 2024-05-25 MED ORDER — OXYCODONE HCL 5 MG PO TABS
5.0000 mg | ORAL_TABLET | ORAL | Status: DC | PRN
  Administered 2024-05-26: 5 mg via ORAL
  Filled 2024-05-25: qty 1

## 2024-05-25 NOTE — Hospital Course (Signed)
 78 y.o. male with a past medical history of Parkinson's disease, dementia who was in his usual state of health at his memory care unit where he has been staying for about a week. According to this wife, who was at the bedside, patient has had several falls over the past several months. Main reason is that he tends to walk without his walker. He forgets to use it. He was moved to the memory care unit about a week ago. He was found lying on the floor at 4:30 on the morning of admission. He was found to have greater trochanter fracture. He was hospitalized for further management.   8/16.  Patient complains of pain in his right hip.  Unable to straight leg raise with the right leg.

## 2024-05-25 NOTE — Plan of Care (Signed)
  Problem: Clinical Measurements: Goal: Ability to maintain clinical measurements within normal limits will improve Outcome: Progressing Goal: Will remain free from infection Outcome: Progressing Goal: Diagnostic test results will improve Outcome: Progressing Goal: Respiratory complications will improve Outcome: Progressing Goal: Cardiovascular complication will be avoided Outcome: Progressing   Problem: Elimination: Goal: Will not experience complications related to bowel motility Outcome: Progressing Goal: Will not experience complications related to urinary retention Outcome: Progressing   Problem: Pain Managment: Goal: General experience of comfort will improve and/or be controlled Outcome: Progressing   Problem: Safety: Goal: Ability to remain free from injury will improve Outcome: Progressing   Problem: Skin Integrity: Goal: Risk for impaired skin integrity will decrease Outcome: Progressing

## 2024-05-25 NOTE — Assessment & Plan Note (Signed)
 Hemoglobin 11.5 with ferritin of 172.  B12 also on the lower side at 243 on oral supplementation

## 2024-05-25 NOTE — Assessment & Plan Note (Signed)
 Patient on Zoloft 

## 2024-05-25 NOTE — Plan of Care (Signed)

## 2024-05-25 NOTE — Assessment & Plan Note (Signed)
 Last sodium 133

## 2024-05-25 NOTE — Assessment & Plan Note (Signed)
 Likely from fall.  CK trended down from 898 down to 453.

## 2024-05-25 NOTE — Plan of Care (Signed)

## 2024-05-25 NOTE — Progress Notes (Signed)
 Progress Note   Patient: Barry Pope FMW:[id number] DOB: 11-Jul-1946 DOA: 05/22/2024     0 DOS: the patient was seen and examined on 05/25/2024   Brief hospital course: 78 y.o. male with a past medical history of Parkinson's disease, dementia who was in his usual state of health at his memory care unit where he has been staying for about a week. According to this wife, who was at the bedside, patient has had several falls over the past several months. Main reason is that he tends to walk without his walker. He forgets to use it. He was moved to the memory care unit about a week ago. He was found lying on the floor at 4:30 on the morning of admission. He was found to have greater trochanter fracture. He was hospitalized for further management.   8/16.  Patient complains of pain in his right hip.  Unable to straight leg raise with the right leg.  Assessment and Plan: * Greater trochanter fracture (HCC) Right greater trochanteric fracture.  Orthopedic reviewed x-ray and recommended medical management.  Physical therapy recommending rehab.  TOC looking into rehab options.  Discontinue IV pain medication.  Oral pain medication and switch to Tylenol  as quickly as possible.  Patient also on Robaxin .  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  Parkinson's disease Continue Sinemet   Rhabdomyolysis Likely from fall.  CK trended down from 898 down to 453.  Hyponatremia Last sodium 133  Anemia of chronic disease Hemoglobin 11.5 with ferritin of 172.  B12 also on the lower side at 243 on oral supplementation        Subjective: Patient complains of pain in his right hip.  Unable to straight leg raise with right leg.  Patient was feeding himself breakfast and he poured his Ensure in his plate.  Admitted with right greater trochanter fracture  Physical Exam: Vitals:   05/24/24 0534 05/24/24 1300 05/24/24 2059 05/25/24 0602  BP: (!) 163/91 (!) 142/78 129/65 (!) 155/83   Pulse: 72 72 81 75  Resp: 17 15 19 17   Temp: (!) 97.5 F (36.4 C) 97.7 F (36.5 C) 97.8 F (36.6 C) 98.2 F (36.8 C)  TempSrc:   Oral Oral  SpO2: 99% 97% 95% 100%  Weight:      Height:       Physical Exam HENT:     Head: Normocephalic.  Eyes:     General: Lids are normal.  Cardiovascular:     Rate and Rhythm: Normal rate and regular rhythm.     Heart sounds: Normal heart sounds, S1 normal and S2 normal.  Pulmonary:     Breath sounds: No decreased breath sounds, wheezing, rhonchi or rales.  Abdominal:     Palpations: Abdomen is soft.     Tenderness: There is no abdominal tenderness.  Musculoskeletal:     Right lower leg: No swelling.     Left lower leg: No swelling.  Skin:    General: Skin is warm.     Findings: No rash.  Neurological:     Mental Status: He is alert.     Comments: Unable to straight leg raise with right leg     Data Reviewed: Vitamin B12 243, ferritin 172, hemoglobin 11.5 , Creatinine 0.76, sodium 133, CK4 53 Family Communication: Spoke with wife on the phone  Disposition: Status is: Observation TOC working on placement.  Needs a pasrr and rehab bed.  Unable to go back to his memory care unit  Planned  Discharge Destination: Rehab    Time spent: 28 minutes Case discussed with Child psychotherapist.  Author: Charlie Patterson, MD 05/25/2024 12:14 PM  For on call review [email or web address].

## 2024-05-25 NOTE — Assessment & Plan Note (Addendum)
 Right greater trochanteric fracture.  Orthopedic reviewed x-ray and recommended medical management.  Physical therapy recommending rehab.  TOC looking into rehab options.  Discontinued IV pain medication 8/16.  Decrease oral pain medication to 2.5 mg and only use for severe pain.  Continue Tylenol  and Robaxin  standing dose.

## 2024-05-25 NOTE — Assessment & Plan Note (Signed)
-

## 2024-05-26 DIAGNOSIS — S72111S Displaced fracture of greater trochanter of right femur, sequela: Secondary | ICD-10-CM | POA: Diagnosis not present

## 2024-05-26 DIAGNOSIS — T796XXS Traumatic ischemia of muscle, sequela: Secondary | ICD-10-CM | POA: Diagnosis not present

## 2024-05-26 DIAGNOSIS — K5909 Other constipation: Secondary | ICD-10-CM

## 2024-05-26 DIAGNOSIS — M6282 Rhabdomyolysis: Secondary | ICD-10-CM | POA: Diagnosis not present

## 2024-05-26 DIAGNOSIS — G20A1 Parkinson's disease without dyskinesia, without mention of fluctuations: Secondary | ICD-10-CM | POA: Diagnosis not present

## 2024-05-26 DIAGNOSIS — E871 Hypo-osmolality and hyponatremia: Secondary | ICD-10-CM | POA: Diagnosis not present

## 2024-05-26 DIAGNOSIS — K5900 Constipation, unspecified: Secondary | ICD-10-CM | POA: Insufficient documentation

## 2024-05-26 DIAGNOSIS — S72111D Displaced fracture of greater trochanter of right femur, subsequent encounter for closed fracture with routine healing: Secondary | ICD-10-CM | POA: Diagnosis not present

## 2024-05-26 MED ORDER — BISACODYL 5 MG PO TBEC
5.0000 mg | DELAYED_RELEASE_TABLET | Freq: Once | ORAL | Status: AC
  Administered 2024-05-26: 5 mg via ORAL
  Filled 2024-05-26: qty 1

## 2024-05-26 MED ORDER — POLYETHYLENE GLYCOL 3350 17 G PO PACK
17.0000 g | PACK | Freq: Every day | ORAL | Status: DC
  Administered 2024-05-26 – 2024-05-27 (×2): 17 g via ORAL
  Filled 2024-05-26 (×2): qty 1

## 2024-05-26 MED ORDER — OXYCODONE HCL 5 MG PO TABS: 2.5000 mg | ORAL_TABLET | ORAL | Status: DC | PRN

## 2024-05-26 NOTE — TOC Progression Note (Signed)
 Transition of Care [facility]) - Progression Note    Patient Details  Name: Barry Pope MRN: [id number] Date of Birth: 12/28/1945  Transition of Care [facility]) CM/SW Contact  Sheri ONEIDA [place], [place] Phone Number: 05/26/2024, 2:25 PM  Clinical Narrative:    CSW spoke with pt wife. Pt wife requested that referral be sent to [facility]. CSW faxed referral. Wife also stated that she has friend reaching out to [facility]. CSW expressed that decision would need to be made tomorrow. Wife says 1st choice would [facility], if unable to go to [facility] wife would like [facility]. CSW informed wife that [facility] will follow up for finally decision tomorrow morning.   Expected Discharge Plan: Skilled Nursing Facility Barriers to Discharge: Continued Medical Work up               Expected Discharge Plan and Services       Living arrangements for the past 2 months: Skilled Nursing Facility ([facility])                                       Social Drivers of Health (SDOH) Interventions SDOH Screenings   Food Insecurity: No Food Insecurity (05/22/2024)  Housing: Low Risk  (05/22/2024)  Transportation Needs: No Transportation Needs (05/22/2024)  Utilities: Not At Risk (05/22/2024)  Social Connections: Patient Unable To Answer (05/22/2024)  Tobacco Use: Low Risk  (05/22/2024)    Readmission Risk Interventions     No data to display

## 2024-05-26 NOTE — Plan of Care (Signed)
   Problem: Health Behavior/Discharge Planning: Goal: Ability to manage health-related needs will improve Outcome: Progressing   Problem: Clinical Measurements: Goal: Ability to maintain clinical measurements within normal limits will improve Outcome: Progressing Goal: Will remain free from infection Outcome: Progressing

## 2024-05-26 NOTE — Progress Notes (Signed)
 Progress Note   Patient: Barry Pope FMW:[id number] DOB: 02/24/1946 DOA: 05/22/2024     0 DOS: the patient was seen and examined on 05/26/2024   Brief hospital course: 78 y.o. male with a past medical history of Parkinson's disease, dementia who was in his usual state of health at his memory care unit where he has been staying for about a week. According to this wife, who was at the bedside, patient has had several falls over the past several months. Main reason is that he tends to walk without his walker. He forgets to use it. He was moved to the memory care unit about a week ago. He was found lying on the floor at 4:30 on the morning of admission. He was found to have greater trochanter fracture. He was hospitalized for further management.   8/16.  Patient complains of pain in his right hip.  Unable to straight leg raise with the right leg.  Assessment and Plan: * Greater trochanter fracture (HCC) Right greater trochanteric fracture.  Orthopedic reviewed x-ray and recommended medical management.  Physical therapy recommending rehab.  TOC looking into rehab options.  Discontinued IV pain medication 8/16.  Decrease oral pain medication to 2.5 mg and only use for severe pain.  Continue Tylenol  and Robaxin  standing dose.  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  Parkinson's disease Continue Sinemet   Rhabdomyolysis Likely from fall.  CK trended down from 898 down to 453.  Hyponatremia Last sodium 133  Anemia of chronic disease Hemoglobin 11.5 with ferritin of 172.  B12 also on the lower side at 243 on oral supplementation  Constipation Dulcolax and MiraLAX .        Subjective: Patient awaking from sleep.  Not as talkative this morning.  He was able to straight leg raise with his right leg today and yesterday was not.  Admitted with hip fracture and pain control.  Physical Exam: Vitals:   05/25/24 0602 05/25/24 1500 05/25/24 2133 05/26/24 0516  BP: (!)  155/83 122/79 (!) 145/81 (!) 147/73  Pulse: 75 80 73 75  Resp: 17  16 15   Temp: 98.2 F (36.8 C) (!) 97.4 F (36.3 C) 97.7 F (36.5 C) 97.8 F (36.6 C)  TempSrc: Oral Oral  Oral  SpO2: 100% 98% 97% 95%  Weight:      Height:       Physical Exam HENT:     Head: Normocephalic.  Eyes:     General: Lids are normal.  Cardiovascular:     Rate and Rhythm: Normal rate and regular rhythm.     Heart sounds: Normal heart sounds, S1 normal and S2 normal.  Pulmonary:     Breath sounds: No decreased breath sounds, wheezing, rhonchi or rales.  Abdominal:     Palpations: Abdomen is soft.     Tenderness: There is no abdominal tenderness.  Musculoskeletal:     Right lower leg: No swelling.     Left lower leg: No swelling.  Skin:    General: Skin is warm.     Findings: No rash.  Neurological:     Mental Status: He is lethargic.     Comments: Patient was able to straight leg raise with both legs today.     Data Reviewed: No new data today  Family Communication: Spoke with wife on the phone  Disposition: Status is: Observation Will need a rehab bed, Scientist, physiological and insurance authorization for rehab  Planned Discharge Destination: Rehab    Time spent: 54  minutes Case discussed with nursing staff  Author: Charlie Patterson, MD 05/26/2024 1:13 PM  For on call review [email or web address].

## 2024-05-26 NOTE — Assessment & Plan Note (Signed)
 Dulcolax and MiraLAX .

## 2024-05-26 NOTE — Progress Notes (Signed)
 Physical Therapy Treatment Patient Details Name: Barry Pope MRN: [id number] DOB: [birth year] Today's Date: 05/26/2024   History of Present Illness 78 y.o. male with a PMH: Parkinson's disease, dementia. pt had mechanical fall at [facility] care unit adn was unabel to amb. Per pt's wife, pt has had multiple falls over the past several months as he tends to walk without his walker forgets to use it.  EMS was called and the patient was transported to [facility]  emergency department. Imaging revealed R  greater trochanter fracture.    PT Comments  Pt more sleepy today but able to arouse with multi-modal stim. RN reports likely d/t oxy pt had this morning. Pt able to amb ~ 50' with RW and +2 min assist, noted to exhibit L lateral lean and L lateral cervical flexion which is change from prior session, RN aware. Pt is able to track visually and turn head to R with cues and wife as visual target. Plan for SNF remains appropriate.    If plan is discharge home, recommend the following: Help with stairs or ramp for entrance;Assist for transportation;A little help with walking and/or transfers;A little help with bathing/dressing/bathroom   Can travel by private vehicle     No  Equipment Recommendations  None recommended by PT    Recommendations for Other Services       Precautions / Restrictions Precautions Precautions: Fall Recall of Precautions/Restrictions: Intact Precaution/Restrictions Comments: hx of dementia, confusion, Parkinson's, HOH Restrictions Weight Bearing Restrictions Per Provider Order: No RLE Weight Bearing Per Provider Order: Weight bearing as tolerated     Mobility  Bed Mobility Overal bed mobility: Needs Assistance Bed Mobility: Supine to Sit     Supine to sit: Mod assist, +2 for physical assistance, +2 for safety/equipment     General bed mobility comments: assist to progress LEs off bed and elevate trunk    Transfers Overall transfer level: Needs  assistance Equipment used: Rolling walker (2 wheels) Transfers: Sit to/from Stand Sit to Stand: Min assist, +2 physical assistance, +2 safety/equipment           General transfer comment: multi-modal cues for hand placement and wt shift to standing.    Ambulation/Gait Ambulation/Gait assistance: Min assist, +2 physical assistance, +2 safety/equipment Gait Distance (Feet): 50 Feet Assistive device: Rolling walker (2 wheels) Gait Pattern/deviations: Step-to pattern, Decreased step length - right, Decreased step length - left, Narrow base of support Gait velocity: decr     General Gait Details: multi-modal cues for step length and RW position. assist to steady and manage RW. pt noted to be leaning to L, head tilt to L  which is change from prior session   Stairs             Wheelchair Mobility     Tilt Bed    Modified Rankin (Stroke Patients Only)       Balance                                            Communication Communication Communication: Impaired Factors Affecting Communication: Hearing impaired  Cognition Arousal: Alert Behavior During Therapy: WFL for tasks assessed/performed, Flat affect   PT - Cognitive impairments: History of cognitive impairments, Memory, Initiation, Sequencing                       PT - Cognition Comments: more  sleepy today, incr tim eto follow commands, multi-modal stim to arouse. RN reports pt had oxy earlier and this is likely the cause Following commands: Impaired Following commands impaired: Follows one step commands with increased time, Only follows one step commands consistently    Cueing Cueing Techniques: Verbal cues, Tactile cues, Visual cues, Gestural cues  Exercises General Exercises - Lower Extremity Heel Slides: AAROM, Right, 5 reps, Limitations Heel Slides Limitations: fallign asleep, difficult to remain awake for ex's    General Comments        Pertinent Vitals/Pain Pain  Assessment Pain Assessment: Faces Faces Pain Scale: Hurts a little bit Pain Location: right hip with movement Pain Descriptors / Indicators: Discomfort, Grimacing Pain Intervention(s): Limited activity within patient's tolerance, Monitored during session, Repositioned, Ice applied    Home Living                          Prior Function            PT Goals (current goals can now be found in the care plan section) Acute Rehab PT Goals Patient Stated Goal: less falls PT Goal Formulation: With patient/family Time For Goal Achievement: 06/06/24 Potential to Achieve Goals: Fair Progress towards PT goals: Progressing toward goals    Frequency    Min 3X/week      PT Plan      Co-evaluation              AM-PAC PT 6 Clicks Mobility   Outcome Measure  Help needed turning from your back to your side while in a flat bed without using bedrails?: A Little Help needed moving from lying on your back to sitting on the side of a flat bed without using bedrails?: A Lot Help needed moving to and from a bed to a chair (including a wheelchair)?: A Lot Help needed standing up from a chair using your arms (e.g., wheelchair or bedside chair)?: A Lot Help needed to walk in hospital room?: A Lot Help needed climbing 3-5 steps with a railing? : Total 6 Click Score: 12    End of Session Equipment Utilized During Treatment: Gait belt Activity Tolerance: Patient tolerated treatment well Patient left: with call bell/phone within reach;with chair alarm set;with family/visitor present Nurse Communication: Mobility status PT Visit Diagnosis: Other abnormalities of gait and mobility (R26.89)     Time: 8856-8784 PT Time Calculation (min) (ACUTE ONLY): 32 min  Charges:    $Gait Training: 23-37 mins PT General Charges $$ ACUTE PT VISIT: 1 Visit                     Meigan Pates, PT  Acute Rehab Dept [facility]) [phone]  05/26/2024    [facility] 05/26/2024, 12:30 PM

## 2024-05-27 DIAGNOSIS — W19XXXA Unspecified fall, initial encounter: Secondary | ICD-10-CM | POA: Diagnosis not present

## 2024-05-27 DIAGNOSIS — S72113A Displaced fracture of greater trochanter of unspecified femur, initial encounter for closed fracture: Secondary | ICD-10-CM | POA: Diagnosis not present

## 2024-05-27 DIAGNOSIS — Z85060 Personal history of malignant carcinoid tumor of small intestine: Secondary | ICD-10-CM | POA: Diagnosis not present

## 2024-05-27 DIAGNOSIS — F0280 Dementia in other diseases classified elsewhere without behavioral disturbance: Secondary | ICD-10-CM | POA: Diagnosis not present

## 2024-05-27 DIAGNOSIS — S72001A Fracture of unspecified part of neck of right femur, initial encounter for closed fracture: Secondary | ICD-10-CM

## 2024-05-27 DIAGNOSIS — Z79899 Other long term (current) drug therapy: Secondary | ICD-10-CM | POA: Diagnosis not present

## 2024-05-27 DIAGNOSIS — S72111D Displaced fracture of greater trochanter of right femur, subsequent encounter for closed fracture with routine healing: Secondary | ICD-10-CM | POA: Diagnosis not present

## 2024-05-27 DIAGNOSIS — G4700 Insomnia, unspecified: Secondary | ICD-10-CM | POA: Diagnosis not present

## 2024-05-27 DIAGNOSIS — G20C Parkinsonism, unspecified: Secondary | ICD-10-CM | POA: Diagnosis not present

## 2024-05-27 DIAGNOSIS — J383 Other diseases of vocal cords: Secondary | ICD-10-CM | POA: Diagnosis not present

## 2024-05-27 DIAGNOSIS — G20A1 Parkinson's disease without dyskinesia, without mention of fluctuations: Secondary | ICD-10-CM | POA: Diagnosis not present

## 2024-05-27 DIAGNOSIS — Z7401 Bed confinement status: Secondary | ICD-10-CM | POA: Diagnosis not present

## 2024-05-27 DIAGNOSIS — D638 Anemia in other chronic diseases classified elsewhere: Secondary | ICD-10-CM | POA: Diagnosis not present

## 2024-05-27 DIAGNOSIS — R41841 Cognitive communication deficit: Secondary | ICD-10-CM | POA: Diagnosis not present

## 2024-05-27 DIAGNOSIS — S72113D Displaced fracture of greater trochanter of unspecified femur, subsequent encounter for closed fracture with routine healing: Secondary | ICD-10-CM | POA: Diagnosis not present

## 2024-05-27 DIAGNOSIS — R059 Cough, unspecified: Secondary | ICD-10-CM | POA: Diagnosis not present

## 2024-05-27 DIAGNOSIS — R9389 Abnormal findings on diagnostic imaging of other specified body structures: Secondary | ICD-10-CM | POA: Diagnosis not present

## 2024-05-27 DIAGNOSIS — S72111S Displaced fracture of greater trochanter of right femur, sequela: Secondary | ICD-10-CM | POA: Diagnosis not present

## 2024-05-27 DIAGNOSIS — M6282 Rhabdomyolysis: Secondary | ICD-10-CM | POA: Diagnosis not present

## 2024-05-27 DIAGNOSIS — Y929 Unspecified place or not applicable: Secondary | ICD-10-CM | POA: Diagnosis not present

## 2024-05-27 DIAGNOSIS — G3184 Mild cognitive impairment, so stated: Secondary | ICD-10-CM | POA: Diagnosis not present

## 2024-05-27 DIAGNOSIS — K5900 Constipation, unspecified: Secondary | ICD-10-CM | POA: Diagnosis not present

## 2024-05-27 DIAGNOSIS — S79911A Unspecified injury of right hip, initial encounter: Secondary | ICD-10-CM | POA: Diagnosis not present

## 2024-05-27 DIAGNOSIS — R4182 Altered mental status, unspecified: Secondary | ICD-10-CM | POA: Diagnosis not present

## 2024-05-27 DIAGNOSIS — E871 Hypo-osmolality and hyponatremia: Secondary | ICD-10-CM | POA: Diagnosis not present

## 2024-05-27 DIAGNOSIS — Z859 Personal history of malignant neoplasm, unspecified: Secondary | ICD-10-CM | POA: Diagnosis not present

## 2024-05-27 DIAGNOSIS — R52 Pain, unspecified: Secondary | ICD-10-CM | POA: Diagnosis not present

## 2024-05-27 DIAGNOSIS — R262 Difficulty in walking, not elsewhere classified: Secondary | ICD-10-CM | POA: Diagnosis not present

## 2024-05-27 DIAGNOSIS — R748 Abnormal levels of other serum enzymes: Secondary | ICD-10-CM | POA: Diagnosis not present

## 2024-05-27 DIAGNOSIS — J385 Laryngeal spasm: Secondary | ICD-10-CM | POA: Diagnosis not present

## 2024-05-27 DIAGNOSIS — T7840XA Allergy, unspecified, initial encounter: Secondary | ICD-10-CM | POA: Diagnosis not present

## 2024-05-27 DIAGNOSIS — K6389 Other specified diseases of intestine: Secondary | ICD-10-CM | POA: Diagnosis not present

## 2024-05-27 DIAGNOSIS — R490 Dysphonia: Secondary | ICD-10-CM | POA: Diagnosis not present

## 2024-05-27 DIAGNOSIS — R278 Other lack of coordination: Secondary | ICD-10-CM | POA: Diagnosis not present

## 2024-05-27 DIAGNOSIS — G9341 Metabolic encephalopathy: Secondary | ICD-10-CM | POA: Diagnosis not present

## 2024-05-27 DIAGNOSIS — S0990XA Unspecified injury of head, initial encounter: Secondary | ICD-10-CM | POA: Diagnosis not present

## 2024-05-27 DIAGNOSIS — R7401 Elevation of levels of liver transaminase levels: Secondary | ICD-10-CM | POA: Diagnosis not present

## 2024-05-27 DIAGNOSIS — Y939 Activity, unspecified: Secondary | ICD-10-CM | POA: Diagnosis not present

## 2024-05-27 LAB — BASIC METABOLIC PANEL WITH GFR
Anion gap: 8 (ref 5–15)
BUN: 18 mg/dL (ref 8–23)
CO2: 24 mmol/L (ref 22–32)
Calcium: 8.7 mg/dL — ABNORMAL LOW (ref 8.9–10.3)
Chloride: 99 mmol/L (ref 98–111)
Creatinine, Ser: 0.77 mg/dL (ref 0.61–1.24)
GFR, Estimated: >60 mL/min (ref >60)
Glucose, Bld: 108 mg/dL — ABNORMAL HIGH (ref 70–99)
Potassium: 4.1 mmol/L (ref 3.5–5.1)
Sodium: 131 mmol/L — ABNORMAL LOW (ref 135–145)

## 2024-05-27 LAB — CBC
HCT: 35.2 % — ABNORMAL LOW (ref 39.0–52.0)
Hemoglobin: 12.2 g/dL — ABNORMAL LOW (ref 13.0–17.0)
MCH: 31.4 pg (ref 26.0–34.0)
MCHC: 34.7 g/dL (ref 30.0–36.0)
MCV: 90.5 fL (ref 80.0–100.0)
Platelets: 255 K/uL (ref 150–400)
RBC: 3.89 MIL/uL — ABNORMAL LOW (ref 4.22–5.81)
RDW: 11.6 % (ref 11.5–15.5)
WBC: 6.6 K/uL (ref 4.0–10.5)
nRBC: 0 % (ref 0.0–0.2)

## 2024-05-27 MED ORDER — OXYCODONE HCL 5 MG PO TABS: 2.5000 mg | ORAL_TABLET | ORAL | 0 refills | Status: AC | PRN

## 2024-05-27 NOTE — Plan of Care (Signed)
   Problem: Coping: Goal: Level of anxiety will decrease Outcome: Progressing   Problem: Elimination: Goal: Will not experience complications related to urinary retention Outcome: Progressing   Problem: Pain Managment: Goal: General experience of comfort will improve and/or be controlled Outcome: Progressing

## 2024-05-27 NOTE — Progress Notes (Signed)
 Physical Therapy Treatment Patient Details Name: Barry Pope MRN: [id number] DOB: 11/11/1945 Today's Date: 05/27/2024   History of Present Illness 78 y.o. male with a PMH: Parkinson's disease, dementia. pt had mechanical fall at [facility] care unit adn was unabel to amb. Per pt's wife, pt has had multiple falls over the past several months as he tends to walk without his walker forgets to use it.  EMS was called and the patient was transported to [facility]  emergency department. Imaging revealed R  greater trochanter fracture.    PT Comments  Pt more alert today, participated in standing trials, standing exercise/balance activities while also working on counting/vocalization. pt tolerated well. Assisted pt to chair with RW and  mod assist. Pt spouse assisting with feeding. Encouraged upright positioning in chair. D/c plan remains appropriate   If plan is discharge home, recommend the following: Help with stairs or ramp for entrance;Assist for transportation;A little help with walking and/or transfers;A little help with bathing/dressing/bathroom   Can travel by private vehicle     No  Equipment Recommendations  None recommended by PT    Recommendations for Other Services       Precautions / Restrictions Precautions Precautions: Fall Recall of Precautions/Restrictions: Intact Precaution/Restrictions Comments: hx of dementia, confusion, Parkinson's, HOH Restrictions Weight Bearing Restrictions Per Provider Order: No RLE Weight Bearing Per Provider Order: Weight bearing as tolerated     Mobility  Bed Mobility Overal bed mobility: Needs Assistance Bed Mobility: Supine to Sit     Supine to sit: Mod assist, +2 for physical assistance, +2 for safety/equipment     General bed mobility comments: assist to progress LEs off bed and elevate trunk    Transfers Overall transfer level: Needs assistance Equipment used: Rolling walker (2 wheels) Transfers: Sit to/from Stand, Bed to  chair/wheelchair/BSC Sit to Stand: +2 physical assistance, +2 safety/equipment, Min assist, Mod assist Stand pivot transfers: Mod assist         General transfer comment: repeated STS x4 from EOB with pt requiring less assist on later trials. multi-modal cues for hand placement and wt shift to standing.    Ambulation/Gait Ambulation/Gait assistance: Min assist, +2 physical assistance, +2 safety/equipment   Assistive device: Rolling walker (2 wheels) Gait Pattern/deviations: Step-to pattern, Decreased step length - right, Decreased step length - left, Narrow base of support Gait velocity: decr     General Gait Details: multi-modal cues for step length and RW position. assist to steady and manage RW. pt noted to be leaning to L, head tilt to L  which is change from prior session   Stairs             Wheelchair Mobility     Tilt Bed    Modified Rankin (Stroke Patients Only)       Balance                                            Communication Communication Communication: Impaired Factors Affecting Communication: Hearing impaired  Cognition Arousal: Alert Behavior During Therapy: WFL for tasks assessed/performed, Flat affect   PT - Cognitive impairments: History of cognitive impairments, Memory, Initiation, Sequencing                         Following commands: Impaired Following commands impaired: Follows one step commands with increased time, Only follows one step  commands consistently    Cueing Cueing Techniques: Verbal cues, Tactile cues, Visual cues, Gestural cues  Exercises General Exercises - Lower Extremity Heel Slides: AAROM, Right, 5 reps, Limitations Heel Slides Limitations: fallign asleep, difficult to remain awake for ex's Other Exercises Other Exercises: trunk rotation  R/L in sitting, tactile cues and intermittent assist for midline/prevent posterior drift. Other Exercises: marching in standing x ~10 bil LEs, UE  and external support. Other Exercises: stepping fwd and back alternating feet Other Exercises: cervical rotation in standing    General Comments        Pertinent Vitals/Pain Pain Assessment Pain Assessment: Faces Faces Pain Scale: No hurt Pain Intervention(s): Monitored during session    Home Living                          Prior Function            PT Goals (current goals can now be found in the care plan section) Acute Rehab PT Goals Patient Stated Goal: less falls PT Goal Formulation: With patient/family Time For Goal Achievement: 06/06/24 Potential to Achieve Goals: Fair Progress towards PT goals: Progressing toward goals    Frequency    Min 3X/week      PT Plan      Co-evaluation              AM-PAC PT 6 Clicks Mobility   Outcome Measure  Help needed turning from your back to your side while in a flat bed without using bedrails?: A Little Help needed moving from lying on your back to sitting on the side of a flat bed without using bedrails?: A Lot Help needed moving to and from a bed to a chair (including a wheelchair)?: A Lot Help needed standing up from a chair using your arms (e.g., wheelchair or bedside chair)?: A Lot Help needed to walk in hospital room?: A Lot Help needed climbing 3-5 steps with a railing? : Total 6 Click Score: 12    End of Session Equipment Utilized During Treatment: Gait belt Activity Tolerance: Patient tolerated treatment well Patient left: with call bell/phone within reach;with chair alarm set;with family/visitor present Nurse Communication: Mobility status PT Visit Diagnosis: Other abnormalities of gait and mobility (R26.89)     Time: 8849-8783 PT Time Calculation (min) (ACUTE ONLY): 26 min  Charges:    $Therapeutic Activity: 23-37 mins PT General Charges $$ ACUTE PT VISIT: 1 Visit                     Rexene, PT  Acute Rehab Dept [facility]) [phone]  05/27/2024    [facility] 05/27/2024,  12:53 PM

## 2024-05-27 NOTE — TOC Progression Note (Addendum)
 Transition of Care [facility]) - Progression Note    Patient Details  Name: Barry Pope MRN: [id number] Date of Birth: 29-Oct-1945  Transition of Care [facility]) CM/SW Contact  Alfonse JONELLE Rex, RN Phone Number: 05/27/2024, 9:23 AM  Clinical Narrative:   Call to patient's spouse to review SNF bed offers ( [facility], [facility], [place]), accepted  bed offer at [place], SNF admit coord notified.   -11:20am  SNF auth initiated via [facility]. Auth ID : [id number], SNF auth approved, days approved: SNF 05/27/2024-05/29/2024, team notified.     Expected Discharge Plan: Skilled Nursing Facility Barriers to Discharge: Continued Medical Work up               Expected Discharge Plan and Services       Living arrangements for the past 2 months: Skilled Nursing Facility ([facility])                                       Social Drivers of Health (SDOH) Interventions SDOH Screenings   Food Insecurity: No Food Insecurity (05/22/2024)  Housing: Low Risk  (05/22/2024)  Transportation Needs: No Transportation Needs (05/22/2024)  Utilities: Not At Risk (05/22/2024)  Social Connections: Patient Unable To Answer (05/22/2024)  Tobacco Use: Low Risk  (05/22/2024)    Readmission Risk Interventions     No data to display

## 2024-05-27 NOTE — Discharge Summary (Signed)
 [facility]  Physician Discharge Summary   Patient ID: Barry Pope MRN: [id number] DOB/AGE: [birth year] 78 y.o.  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 male with a past medical history of Parkinson's disease, dementia who was in his usual state of health at his memory care unit where he has been staying for about a week.  According to this wife, who was at the bedside, patient has had several falls over the past several months.  Main reason is that he tends to walk without his walker.  He forgets to use it.  He was moved to the memory care unit about a week ago.  He was found lying on the floor at 4:30 on the morning of admission.  He was found to have greater trochanter fracture.  He was hospitalized for further management.     Consultants: Case was discussed with orthopedics over the phone by EDP   Procedures: None  HOSPITAL COURSE:   Mechanical fall resulting in fracture of the right greater trochanter Patient hospitalized due to pain issue and inability to ambulate in the ED.  PT and OT consulted.  Weightbearing as tolerated. Pain seems to be reasonably well-controlled.  He is on scheduled acetaminophen  and scheduled Robaxin . Worked with physical therapy.  SNF is recommended for short-term rehab.   Mild rhabdomyolysis CK level was  mildly elevated.  Improved with IV hydration.  Okay to stop IV fluids.     Hyponatremia Sodium level has improved.   Normocytic anemia/B12 deficiency Mild drop in hemoglobin is dilutional.  Stable this morning.  Anemia panel suggest B12 deficiency which will be supplemented.   Parkinson's disease with dementia Followed by Dr. Evonnie with neurology in the outpatient setting.  Continue with his home medications.  He is noted to be on carbidopa  levodopa .      PERTINENT LABS:  The results of significant diagnostics from this hospitalization (including imaging, microbiology, ancillary and laboratory) are listed below for reference.    Microbiology: Recent Results (from the past 240 hours)  Resp panel by RT-PCR (RSV, Flu A&B, Covid) Anterior Nasal Swab     Status: None   Collection Time: 05/22/24  1:11 PM   Specimen: Anterior Nasal Swab  Result Value Ref Range Status   SARS Coronavirus 2 by RT PCR NEGATIVE NEGATIVE Final    Comment: (NOTE) SARS-CoV-2 target nucleic acids are NOT DETECTED.  The SARS-CoV-2 RNA is generally detectable in upper respiratory specimens during the acute phase of infection. The lowest concentration of SARS-CoV-2 viral copies this assay can detect is 138 copies/mL. A negative result does not preclude SARS-Cov-2 infection and should not be used as the sole basis for treatment or other patient management decisions. A negative result may occur with  improper specimen collection/handling, submission of specimen other than nasopharyngeal swab, presence of viral mutation(s) within the areas targeted by this assay, and  inadequate number of viral copies(<138 copies/mL). A negative result must be combined with clinical observations, patient history, and epidemiological information. The expected result is Negative.  Fact Sheet for Patients:  BloggerCourse.com  Fact Sheet for Healthcare Providers:   SeriousBroker.it  This test is no t yet approved or cleared by the [place]  FDA and  has been authorized for detection and/or diagnosis of SARS-CoV-2 by FDA under an Emergency Use Authorization (EUA). This EUA will remain  in effect (meaning this test can be used) for the duration of the COVID-19 declaration under Section 564(b)(1) of the Act, 21 U.S.C.section 360bbb-3(b)(1), unless the authorization is terminated  or revoked sooner.       Influenza A by PCR NEGATIVE NEGATIVE Final   Influenza B by PCR NEGATIVE NEGATIVE Final    Comment: (NOTE) The Xpert Xpress SARS-CoV-2/FLU/RSV plus assay is intended as an aid in the diagnosis of influenza from Nasopharyngeal swab specimens and should not be used as a sole basis for treatment. Nasal washings and aspirates are unacceptable for Xpert Xpress SARS-CoV-2/FLU/RSV testing.  Fact Sheet for Patients: BloggerCourse.com  Fact Sheet for Healthcare Providers: SeriousBroker.it  This test is not yet approved or cleared by the [place]  FDA and has been authorized for detection and/or diagnosis of SARS-CoV-2 by FDA under an Emergency Use Authorization (EUA). This EUA will remain in effect (meaning this test can be used) for the duration of the COVID-19 declaration under Section 564(b)(1) of the Act, 21 U.S.C. section 360bbb-3(b)(1), unless the authorization is terminated or revoked.     Resp Syncytial Virus by PCR NEGATIVE NEGATIVE Final    Comment: (NOTE) Fact Sheet for Patients: BloggerCourse.com  Fact Sheet for Healthcare Providers: SeriousBroker.it  This test is not yet approved or cleared by the [place]  FDA and has been authorized for detection and/or diagnosis of SARS-CoV-2 by FDA under an Emergency Use Authorization (EUA). This EUA will remain in effect (meaning this test can be used) for  the duration of the COVID-19 declaration under Section 564(b)(1) of the Act, 21 U.S.C. section 360bbb-3(b)(1), unless the authorization is terminated or revoked.  Performed at [facility], [address] [place], [place] [address]      Labs:   Basic Metabolic Panel: Recent Labs  Lab 05/22/24 1311 05/23/24 0316 05/24/24 0325 05/27/24 0319  NA 132* 131* 133* 131*  K 3.8 3.8 3.7 4.1  CL 100 100 103 99  CO2 25 22 23 24   GLUCOSE 117* 100* 107* 108*  BUN 15 10 11 18   CREATININE 0.85 0.79 0.76 0.77  CALCIUM 8.5* 8.1* 8.0* 8.7*  MG  --   --  1.8  --    Liver Function Tests: Recent Labs  Lab 05/22/24 1311  AST 35  ALT 11  ALKPHOS 86  BILITOT 1.0  PROT 6.3*  ALBUMIN 3.7   CBC: Recent Labs  Lab 05/22/24 1311 05/23/24 0316 05/24/24 0325 05/27/24 0319  WBC 8.7 6.6 6.9 6.6  NEUTROABS 6.9  --   --   --   HGB 12.7* 11.8* 11.5* 12.2*  HCT 36.0* 33.8* 32.4* 35.2*  MCV 90.2 90.6 89.3 90.5  PLT 200 183 194 255   Cardiac Enzymes: Recent Labs  Lab 05/22/24 1311 05/23/24 0316 05/24/24 0325  CKTOTAL 898* 961* 453*    IMAGING STUDIES CT Hip Right Wo Contrast Result Date: 05/22/2024 CLINICAL DATA:  Status post trauma. EXAM: CT OF THE RIGHT HIP WITHOUT CONTRAST TECHNIQUE: Multidetector CT imaging of the right hip was performed  according to the standard protocol. Multiplanar CT image reconstructions were also generated. RADIATION DOSE REDUCTION: This exam was performed according to the departmental dose-optimization program which includes automated exposure control, adjustment of the mA and/or kV according to patient size and/or use of iterative reconstruction technique. COMPARISON:  None Available. FINDINGS: Bones/Joint/Cartilage Acute fracture deformity is seen involving the greater trochanter of the proximal right femur. There is no evidence of dislocation. Degenerative changes are seen involving the right hip in the form of joint space narrowing,  subchondral cyst formation, acetabular sclerosis and lateral acetabular bony spurring. Ligaments Suboptimally assessed by CT. Muscles and Tendons Unremarkable Soft tissues Multiple surgical clips are seen within right inguinal region. A large amount of stool is seen within the sigmoid colon. Numerous noninflamed diverticula are also seen within this region. IMPRESSION: 1. Acute fracture of the greater trochanter of the proximal right femur. 2. Degenerative changes of the right hip. 3. Sigmoid diverticulosis. Electronically Signed   By: Suzen Dials M.D.   On: 05/22/2024 16:10   CT Cervical Spine Wo Contrast Result Date: 05/22/2024 CLINICAL DATA:  Status post trauma. EXAM: CT CERVICAL SPINE WITHOUT CONTRAST TECHNIQUE: Multidetector CT imaging of the cervical spine was performed without intravenous contrast. Multiplanar CT image reconstructions were also generated. RADIATION DOSE REDUCTION: This exam was performed according to the departmental dose-optimization program which includes automated exposure control, adjustment of the mA and/or kV according to patient size and/or use of iterative reconstruction technique. COMPARISON:  None Available. FINDINGS: Alignment: Approximally 2 mm retrolisthesis of the C3 vertebral body is noted on C4. Skull base and vertebrae: No acute fracture. No primary bone lesion or focal pathologic process. Soft tissues and spinal canal: No prevertebral fluid or swelling. No visible canal hematoma. Disc levels: Marked severity endplate sclerosis is seen at the level of C6-C7 with moderate severity endplate sclerosis present throughout the remainder of the cervical spine. Mild to moderate severity anterior osteophyte formation is seen throughout the cervical spine with posterior bony spurring noted at the levels of C3-C4, C5-C6 and C6-C7. Moderate to marked severity intervertebral disc space narrowing is seen at C6-C7 with mild to moderate severity multilevel intervertebral disc space  narrowing present throughout the remainder of the cervical spine. Bilateral moderate to marked severity facet joint hypertrophy is noted. Upper chest: Mild biapical scarring and/or atelectasis is seen, right greater than left. Other: A 1.6 cm heterogeneous, low-attenuation thyroid  nodule is seen within the left lobe of the thyroid  gland. IMPRESSION: 1. No acute fracture or traumatic subluxation of the cervical spine. 2. Moderate to marked severity multilevel degenerative changes, as described above. 3. 1.6 cm heterogeneous, low-attenuation thyroid  nodule within the left lobe of the thyroid  gland. Correlation with nonemergent thyroid  ultrasound is recommended. Electronically Signed   By: Suzen Dials M.D.   On: 05/22/2024 16:08   CT Head Wo Contrast Result Date: 05/22/2024 CLINICAL DATA:  Status post trauma. EXAM: CT HEAD WITHOUT CONTRAST TECHNIQUE: Contiguous axial images were obtained from the base of the skull through the vertex without intravenous contrast. RADIATION DOSE REDUCTION: This exam was performed according to the departmental dose-optimization program which includes automated exposure control, adjustment of the mA and/or kV according to patient size and/or use of iterative reconstruction technique. COMPARISON:  January 27, 2022 FINDINGS: Brain: There is generalized cerebral atrophy with widening of the extra-axial spaces and ventricular dilatation. There are areas of decreased attenuation within the white matter tracts of the supratentorial brain, consistent with microvascular disease changes. Vascular: No hyperdense vessel or  unexpected calcification. Skull: Normal. Negative for fracture or focal lesion. Sinuses/Orbits: No acute finding. Other: None. IMPRESSION: 1. No acute intracranial abnormality. 2. Generalized cerebral atrophy and microvascular disease changes of the supratentorial brain. Electronically Signed   By: Suzen Dials M.D.   On: 05/22/2024 16:04   DG Hip Unilat  With Pelvis  2-3 Views Right Result Date: 05/22/2024 CLINICAL DATA:  Pain EXAM: DG HIP (WITH OR WITHOUT PELVIS) 2-3V RIGHT COMPARISON:  None Available. FINDINGS: Apophyseal avulsion fracture of the right greater trochanter with mild displacement. No other fracture or dislocation. There is no evidence of arthropathy or other focal bone abnormality. Postsurgical changes overlying the right pelvic region likely from prior hernia mesh repair. Degenerative changes of the lower lumbar spine. IMPRESSION: Isolated greater trochanter fracture of the right femur, possibly avulsion type. Electronically Signed   By: Megan  Zare M.D.   On: 05/22/2024 12:52   DG Chest Port 1 View Result Date: 05/22/2024 CLINICAL DATA:  Fall. EXAM: PORTABLE CHEST 1 VIEW COMPARISON:  01/27/2022. FINDINGS: The heart size and mediastinal contours are within normal limits. Aortic atherosclerosis. No focal consolidation, pleural effusion, or pneumothorax. Moderate degenerative arthropathy of the left glenohumeral joint. No acute osseous abnormality. IMPRESSION: No acute findings in the chest. Electronically Signed   By: Harrietta Sherry M.D.   On: 05/22/2024 12:52    DISCHARGE EXAMINATION: Vitals:   05/26/24 0516 05/26/24 1520 05/26/24 2058 05/27/24 0552  BP: (!) 147/73 (!) 154/88 (!) 129/94 (!) 140/98  Pulse: 75 70 71 69  Resp: 15 16 16 14   Temp: 97.8 F (36.6 C) 98.1 F (36.7 C) 97.7 F (36.5 C) 98.5 F (36.9 C)  TempSrc: Oral Oral  Oral  SpO2: 95% 100% 99% 96%  Weight:      Height:       General appearance: Awake alert.  In no distress.     DISPOSITION: SNF versus back to his memory care unit  Discharge Instructions     Call MD for:  difficulty breathing, headache or visual disturbances   Complete by: As directed    Call MD for:  extreme fatigue   Complete by: As directed    Call MD for:  persistant dizziness or light-headedness   Complete by: As directed    Call MD for:  persistant nausea and vomiting   Complete by: As  directed    Call MD for:  severe uncontrolled pain   Complete by: As directed    Call MD for:  temperature >100.4   Complete by: As directed    Diet - low sodium heart healthy   Complete by: As directed    Discharge instructions   Complete by: As directed    Please follow-up with primary care provider.  B12 level will need to be rechecked in 4 weeks.  You were cared for by a hospitalist during your hospital stay. If you have any questions about your discharge medications or the care you received while you were in the hospital after you are discharged, you can call the unit and asked to speak with the hospitalist on call if the hospitalist that took care of you is not available. Once you are discharged, your primary care physician will handle any further medical issues. Please note that NO REFILLS for any discharge medications will be authorized once you are discharged, as it is imperative that you return to your primary care physician (or establish a relationship with a primary care physician if you do not have one)  for your aftercare needs so that they can reassess your need for medications and monitor your lab values. If you do not have a primary care physician, you can call [phone] for a physician referral.   Increase activity slowly   Complete by: As directed          Allergies as of 05/27/2024   No Known Allergies      Medication List     STOP taking these medications    acyclovir  200 MG capsule Commonly known as: ZOVIRAX    clonazePAM  0.5 MG tablet Commonly known as: KlonoPIN    Comirnaty  syringe Generic drug: COVID-19 mRNA vaccine (Pfizer)   doxycycline  50 MG capsule Commonly known as: VIBRAMYCIN    Nuplazid  34 MG Caps Generic drug: Pimavanserin  Tartrate       TAKE these medications    acetaminophen  325 MG tablet Commonly known as: TYLENOL  Take 2 tablets (650 mg total) by mouth 3 (three) times daily.   ALPRAZolam  0.5 MG tablet Commonly known as: XANAX  Take  1 tablet (0.5 mg total) by mouth 2 (two) times daily as needed for panic. What changed:  when to take this reasons to take this   cyanocobalamin  1000 MCG tablet Take 1 tablet (1,000 mcg total) by mouth daily.   feeding supplement Liqd Take 237 mLs by mouth 2 (two) times daily between meals.   methocarbamol  500 MG tablet Commonly known as: ROBAXIN  Take 1 tablet 3 times a day on scheduled basis for 5 days and then 3 times a day as needed for muscle spasms   oxyCODONE  5 MG immediate release tablet Commonly known as: Oxy IR/ROXICODONE  Take 0.5 tablets (2.5 mg total) by mouth every 4 (four) hours as needed for severe pain (pain score 7-10).   polyethylene glycol 17 g packet Commonly known as: MIRALAX  / GLYCOLAX  Take 17 g by mouth daily as needed for moderate constipation.   Rytary  61.25-245 MG Cpcr Generic drug: Carbidopa -Levodopa  ER Take 1 capsule by mouth 4 times per day at 7am/11am/4pm/bedtime. What changed:  how much to take how to take this when to take this additional instructions   senna-docusate 8.6-50 MG tablet Commonly known as: Senokot-S Take 2 tablets by mouth 2 (two) times daily.   sertraline  100 MG tablet Commonly known as: ZOLOFT  Take 1 tablet (100 mg total) by mouth daily.          Contact information for follow-up providers     Loreli Kins, MD. Schedule an appointment as soon as possible for a visit in 1 week(s).   Specialty: Family Medicine Why: post hospitalization follow up Contact information: [address] [place] [address] [phone]              Contact information for after-discharge care     Destination     [facility]   Service: Skilled Nursing Contact information: [address] [place]  [address] [phone]                     TOTAL DISCHARGE TIME: 35 minutes  Harlene RAYMOND Bowl  [facility] Pager on [email or web address]  05/27/2024,  11:24 AM

## 2024-05-27 NOTE — TOC Transition Note (Signed)
 Transition of Care [facility]) - Discharge Note   Patient Details  Name: Barry Pope MRN: [id number] Date of Birth: 02-20-1946  [facility] CM/SW Contact:  Barry JONELLE Rex, RN Phone Number: 05/27/2024, 12:27 PM   Clinical Narrative:  DC to [facility], [facility], Call Report [phone]-Barry Pope. Patient's spouse at bedside, notified of transfer. [facility] for transport. No further TOC needs identified at this time.       Final next level of care: Skilled Nursing Facility Barriers to Discharge: Barriers Resolved   Patient Goals and CMS Choice Patient states their goals for this hospitalization and ongoing recovery are:: return to [facility] Unit after stay at [facility] CMS Medicare.gov Compare Post Acute Care list provided to:: Patient Represenative (must comment) (Barry Pope,Barry Pope (Spouse)  [phone] (Mobile)) Choice offered to / list presented to : Spouse [place] ownership interest in [facility].provided to:: Spouse    Discharge Placement              Patient chooses bed at: [facility] Patient to be transferred to facility by: [facility] Name of family member notified: Barry Pope,Barry Pope (Spouse) at bedside Patient and family notified of of transfer: 05/27/24  Discharge Plan and Services Additional resources added to the After Visit Summary for                                       Social Drivers of Health (SDOH) Interventions SDOH Screenings   Food Insecurity: No Food Insecurity (05/22/2024)  Housing: Low Risk  (05/22/2024)  Transportation Needs: No Transportation Needs (05/22/2024)  Utilities: Not At Risk (05/22/2024)  Social Connections: Patient Unable To Answer (05/22/2024)  Tobacco Use: Low Risk  (05/22/2024)     Readmission Risk Interventions     No data to display

## 2024-05-28 DIAGNOSIS — S72111S Displaced fracture of greater trochanter of right femur, sequela: Secondary | ICD-10-CM | POA: Diagnosis not present

## 2024-05-28 DIAGNOSIS — K5900 Constipation, unspecified: Secondary | ICD-10-CM | POA: Diagnosis not present

## 2024-05-28 DIAGNOSIS — S72113A Displaced fracture of greater trochanter of unspecified femur, initial encounter for closed fracture: Secondary | ICD-10-CM | POA: Diagnosis not present

## 2024-05-28 DIAGNOSIS — D638 Anemia in other chronic diseases classified elsewhere: Secondary | ICD-10-CM | POA: Diagnosis not present

## 2024-05-29 DIAGNOSIS — S72111S Displaced fracture of greater trochanter of right femur, sequela: Secondary | ICD-10-CM | POA: Diagnosis not present

## 2024-05-29 DIAGNOSIS — D638 Anemia in other chronic diseases classified elsewhere: Secondary | ICD-10-CM | POA: Diagnosis not present

## 2024-05-29 DIAGNOSIS — S72113A Displaced fracture of greater trochanter of unspecified femur, initial encounter for closed fracture: Secondary | ICD-10-CM | POA: Diagnosis not present

## 2024-05-29 DIAGNOSIS — K5900 Constipation, unspecified: Secondary | ICD-10-CM | POA: Diagnosis not present

## 2024-05-31 DIAGNOSIS — S72111S Displaced fracture of greater trochanter of right femur, sequela: Secondary | ICD-10-CM | POA: Diagnosis not present

## 2024-05-31 DIAGNOSIS — D638 Anemia in other chronic diseases classified elsewhere: Secondary | ICD-10-CM | POA: Diagnosis not present

## 2024-05-31 DIAGNOSIS — K5900 Constipation, unspecified: Secondary | ICD-10-CM | POA: Diagnosis not present

## 2024-05-31 DIAGNOSIS — S72113A Displaced fracture of greater trochanter of unspecified femur, initial encounter for closed fracture: Secondary | ICD-10-CM | POA: Diagnosis not present

## 2024-06-01 DIAGNOSIS — S72113A Displaced fracture of greater trochanter of unspecified femur, initial encounter for closed fracture: Secondary | ICD-10-CM | POA: Diagnosis not present

## 2024-06-01 DIAGNOSIS — D638 Anemia in other chronic diseases classified elsewhere: Secondary | ICD-10-CM | POA: Diagnosis not present

## 2024-06-01 DIAGNOSIS — S72111S Displaced fracture of greater trochanter of right femur, sequela: Secondary | ICD-10-CM | POA: Diagnosis not present

## 2024-06-01 DIAGNOSIS — K5900 Constipation, unspecified: Secondary | ICD-10-CM | POA: Diagnosis not present

## 2024-06-04 ENCOUNTER — Telehealth: Payer: Self-pay | Admitting: Neurology

## 2024-06-04 ENCOUNTER — Other Ambulatory Visit (HOSPITAL_BASED_OUTPATIENT_CLINIC_OR_DEPARTMENT_OTHER): Payer: Self-pay

## 2024-06-04 NOTE — Telephone Encounter (Signed)
 Pt.s wife calling as Pt. In [facility] Care Facility ([facility] [place], fractured hip completed Rehab and is back at [facility] .  Pt.s wife and nurse at facility are Requesting: -Call back -Referral for PT,OT and Voice -Rx for agitation (constantly getting out of wheelchair and bed harming himself by falling, can't use restraints at [facility])

## 2024-06-05 ENCOUNTER — Other Ambulatory Visit (HOSPITAL_BASED_OUTPATIENT_CLINIC_OR_DEPARTMENT_OTHER): Payer: Self-pay

## 2024-06-05 MED ORDER — ALPRAZOLAM 0.5 MG PO TABS
0.5000 mg | ORAL_TABLET | Freq: Two times a day (BID) | ORAL | 0 refills | Status: AC
  Filled 2024-06-07 – 2024-06-08 (×2): qty 60, 30d supply, fill #0

## 2024-06-06 NOTE — Telephone Encounter (Signed)
 Called patients wife they have OT PT and ST coming out already.  Patients wife is afraid that there is not much else to do from a parkinson's stand point. Patient is aggressive and is always trying to get out of the chair. I did talk with Barry Pope patients wife about calling Dr. Loreli for assistance at this point. I did tell Barry Pope I would ask Dr. Evonnie for any advice for moving forward. Barry Pope did let me know her husband is not himself at all

## 2024-06-07 ENCOUNTER — Other Ambulatory Visit (HOSPITAL_BASED_OUTPATIENT_CLINIC_OR_DEPARTMENT_OTHER): Payer: Self-pay

## 2024-06-07 ENCOUNTER — Other Ambulatory Visit: Payer: Self-pay

## 2024-06-08 ENCOUNTER — Other Ambulatory Visit (HOSPITAL_BASED_OUTPATIENT_CLINIC_OR_DEPARTMENT_OTHER): Payer: Self-pay

## 2024-06-09 ENCOUNTER — Emergency Department (HOSPITAL_COMMUNITY)
Admission: EM | Admit: 2024-06-09 | Discharge: 2024-06-09 | Disposition: A | Discharge status: Home / Self Care | Source: Skilled Nursing Facility | Attending: Emergency Medicine | Admitting: Emergency Medicine

## 2024-06-09 ENCOUNTER — Emergency Department (HOSPITAL_COMMUNITY)

## 2024-06-09 ENCOUNTER — Encounter (HOSPITAL_COMMUNITY): Payer: Self-pay

## 2024-06-09 DIAGNOSIS — R220 Localized swelling, mass and lump, head: Secondary | ICD-10-CM | POA: Diagnosis not present

## 2024-06-09 DIAGNOSIS — H05223 Edema of bilateral orbit: Secondary | ICD-10-CM | POA: Diagnosis not present

## 2024-06-09 DIAGNOSIS — G20A1 Parkinson's disease without dyskinesia, without mention of fluctuations: Secondary | ICD-10-CM | POA: Diagnosis not present

## 2024-06-09 DIAGNOSIS — R609 Edema, unspecified: Secondary | ICD-10-CM | POA: Diagnosis not present

## 2024-06-09 DIAGNOSIS — R410 Disorientation, unspecified: Secondary | ICD-10-CM | POA: Diagnosis not present

## 2024-06-09 DIAGNOSIS — I10 Essential (primary) hypertension: Secondary | ICD-10-CM | POA: Diagnosis not present

## 2024-06-09 DIAGNOSIS — M7989 Other specified soft tissue disorders: Secondary | ICD-10-CM | POA: Diagnosis not present

## 2024-06-09 DIAGNOSIS — R601 Generalized edema: Secondary | ICD-10-CM | POA: Diagnosis not present

## 2024-06-09 DIAGNOSIS — R600 Localized edema: Secondary | ICD-10-CM | POA: Diagnosis not present

## 2024-06-09 DIAGNOSIS — H02849 Edema of unspecified eye, unspecified eyelid: Secondary | ICD-10-CM | POA: Diagnosis not present

## 2024-06-09 DIAGNOSIS — F0390 Unspecified dementia without behavioral disturbance: Secondary | ICD-10-CM | POA: Diagnosis not present

## 2024-06-09 LAB — BASIC METABOLIC PANEL WITH GFR
Anion gap: 11 (ref 5–15)
BUN: 14 mg/dL (ref 8–23)
CO2: 23 mmol/L (ref 22–32)
Calcium: 8.4 mg/dL — ABNORMAL LOW (ref 8.9–10.3)
Chloride: 103 mmol/L (ref 98–111)
Creatinine, Ser: 0.75 mg/dL (ref 0.61–1.24)
GFR, Estimated: >60 mL/min (ref >60)
Glucose, Bld: 105 mg/dL — ABNORMAL HIGH (ref 70–99)
Potassium: 4.3 mmol/L (ref 3.5–5.1)
Sodium: 137 mmol/L (ref 135–145)

## 2024-06-09 LAB — PRO BRAIN NATRIURETIC PEPTIDE: Pro Brain Natriuretic Peptide: 339 pg/mL — ABNORMAL HIGH (ref <300.0)

## 2024-06-09 LAB — HEPATIC FUNCTION PANEL
ALT: 27 U/L (ref 0–44)
AST: 75 U/L — ABNORMAL HIGH (ref 15–41)
Albumin: 3.4 g/dL — ABNORMAL LOW (ref 3.5–5.0)
Alkaline Phosphatase: 120 U/L (ref 38–126)
Bilirubin, Direct: 0.3 mg/dL — ABNORMAL HIGH (ref 0.0–0.2)
Indirect Bilirubin: 0.4 mg/dL (ref 0.3–0.9)
Total Bilirubin: 0.7 mg/dL (ref 0.0–1.2)
Total Protein: 5.8 g/dL — ABNORMAL LOW (ref 6.5–8.1)

## 2024-06-09 LAB — CBC
HCT: 33 % — ABNORMAL LOW (ref 39.0–52.0)
Hemoglobin: 10.8 g/dL — ABNORMAL LOW (ref 13.0–17.0)
MCH: 30.4 pg (ref 26.0–34.0)
MCHC: 32.7 g/dL (ref 30.0–36.0)
MCV: 93 fL (ref 80.0–100.0)
Platelets: 301 K/uL (ref 150–400)
RBC: 3.55 MIL/uL — ABNORMAL LOW (ref 4.22–5.81)
RDW: 11.6 % (ref 11.5–15.5)
WBC: 9.1 K/uL (ref 4.0–10.5)
nRBC: 0 % (ref 0.0–0.2)

## 2024-06-09 MED ORDER — FUROSEMIDE 40 MG PO TABS
40.0000 mg | ORAL_TABLET | Freq: Once | ORAL | Status: AC
  Administered 2024-06-09: 40 mg via ORAL
  Filled 2024-06-09: qty 1

## 2024-06-09 MED ORDER — DIPHENHYDRAMINE HCL 25 MG PO CAPS
25.0000 mg | ORAL_CAPSULE | Freq: Once | ORAL | Status: AC
  Administered 2024-06-09: 25 mg via ORAL
  Filled 2024-06-09: qty 1

## 2024-06-09 NOTE — ED Provider Notes (Signed)
 [place] EMERGENCY DEPARTMENT AT [facility] Provider Note   CSN: [id number] Arrival date & time: 06/09/24  9440     Patient presents with: Facial Swelling and Foot Swelling   Barry Pope is a 78 y.o. male with past medical history significant for Parkinson's disease, GERD, hyponatremia, carcinoid tumor who presents concern for facial swelling, hand, foot swelling.  Was taking doxycycline  for unknown condition.  Reportedly was on doxycycline  for a month.  Patient denies any pain.  He is disoriented to time, place, situation but this is his baseline.   HPI     Prior to Admission medications   Medication Sig Start Date End Date Taking? Authorizing Provider  acetaminophen  (TYLENOL ) 325 MG tablet Take 2 tablets (650 mg total) by mouth 3 (three) times daily. 05/24/24   Krishnan, Gokul, MD  ALPRAZolam  (XANAX ) 0.5 MG tablet Take 1 tablet (0.5 mg total) by mouth 2 (two) times daily as needed for panic. 05/24/24   Krishnan, Gokul, MD  ALPRAZolam  (XANAX ) 0.5 MG tablet Take 1 tablet (0.5 mg total) by mouth 2 (two) times daily. 06/05/24     Carbidopa -Levodopa  ER (RYTARY ) 61.25-245 MG CPCR Take 1 capsule by mouth 4 times per day at 7am/11am/4pm/bedtime. Patient taking differently: Take 1 capsule by mouth See admin instructions. Take 1 capsule by mouth at 7 AM, 11 AM, 4 PM, and 8 PM 02/23/24   Tat, Rebecca S, DO  cyanocobalamin  1000 MCG tablet Take 1 tablet (1,000 mcg total) by mouth daily. 05/25/24   Krishnan, Gokul, MD  feeding supplement (ENSURE PLUS HIGH PROTEIN) LIQD Take 237 mLs by mouth 2 (two) times daily between meals. 05/24/24   Krishnan, Gokul, MD  methocarbamol  (ROBAXIN ) 500 MG tablet Take 1 tablet 3 times a day on scheduled basis for 5 days and then 3 times a day as needed for muscle spasms 05/24/24   Verdene Purchase, MD  oxyCODONE  (OXY IR/ROXICODONE ) 5 MG immediate release tablet Take 0.5 tablets (2.5 mg total) by mouth every 4 (four) hours as needed for severe pain (pain  score 7-10). 05/27/24   Vann, Jessica U, DO  polyethylene glycol (MIRALAX  / GLYCOLAX ) 17 g packet Take 17 g by mouth daily as needed for moderate constipation. 05/24/24   Krishnan, Gokul, MD  senna-docusate (SENOKOT-S) 8.6-50 MG tablet Take 2 tablets by mouth 2 (two) times daily. 05/24/24   Krishnan, Gokul, MD  sertraline  (ZOLOFT ) 100 MG tablet Take 1 tablet (100 mg total) by mouth daily. 12/29/23       Allergies: Patient has no known allergies.    Review of Systems  All other systems reviewed and are negative.   Updated Vital Signs BP (!) 165/83 (BP Location: Left Arm)   Pulse 81   Temp 98.5 F (36.9 C) (Axillary)   Resp 16   SpO2 100%   Physical Exam Vitals and nursing note reviewed.  Constitutional:      General: He is not in acute distress.    Appearance: Normal appearance.  HENT:     Head: Normocephalic and atraumatic.  Eyes:     General:        Right eye: No discharge.        Left eye: No discharge.  Cardiovascular:     Rate and Rhythm: Normal rate and regular rhythm.     Heart sounds: No murmur heard.    No friction rub. No gallop.  Pulmonary:     Effort: Pulmonary effort is normal.     Breath sounds: Normal  breath sounds.  Abdominal:     General: Bowel sounds are normal.     Palpations: Abdomen is soft.  Skin:    General: Skin is warm and dry.     Capillary Refill: Capillary refill takes less than 2 seconds.     Comments: Soft tissue swelling over the left hand, excoriation, some mild redness, but no warmth, no purulent discharge, no focal abscess.  Diffuse lower extremity edema without pitting.  Periorbital edema noted bilaterally.  No discharge from eyes.  Neurological:     Mental Status: He is alert and oriented to person, place, and time.  Psychiatric:        Mood and Affect: Mood normal.        Behavior: Behavior normal.     (all labs ordered are listed, but only abnormal results are displayed) Labs Reviewed  CBC - Abnormal; Notable for the following  components:      Result Value   RBC 3.55 (*)    Hemoglobin 10.8 (*)    HCT 33.0 (*)    All other components within normal limits  BASIC METABOLIC PANEL WITH GFR - Abnormal; Notable for the following components:   Glucose, Bld 105 (*)    Calcium 8.4 (*)    All other components within normal limits  PRO BRAIN NATRIURETIC PEPTIDE - Abnormal; Notable for the following components:   Pro Brain Natriuretic Peptide 339.0 (*)    All other components within normal limits  HEPATIC FUNCTION PANEL - Abnormal; Notable for the following components:   Total Protein 5.8 (*)    Albumin 3.4 (*)    AST 75 (*)    Bilirubin, Direct 0.3 (*)    All other components within normal limits    EKG: None  Radiology: DG Chest Portable 1 View Result Date: 06/09/2024 EXAM: 1 VIEW XRAY OF THE CHEST 06/09/2024 06:53:00 AM COMPARISON: 05/22/2024 CLINICAL HISTORY: Generalized edema. BIBA from [facility] care. 1 hour ago staff noticed facial swelling, also had hand and foot swelling. Stopped Doxycycline  on Aug 29th. Hx dementia, parkinson's. FINDINGS: LUNGS AND PLEURA: No focal pulmonary opacity. No pulmonary edema. No pleural effusion. No pneumothorax. HEART AND MEDIASTINUM: No acute abnormality of the cardiac and mediastinal silhouettes. Aortic atherosclerosis. BONES AND SOFT TISSUES: No acute osseous abnormality. Degenerative changes of left shoulder. IMPRESSION: 1. No acute cardiopulmonary abnormality. Electronically signed by: Waddell Calk MD 06/09/2024 06:59 AM EDT RP Workstation: [id number]     Procedures   Medications Ordered in the ED  furosemide  (LASIX ) tablet 40 mg (40 mg Oral Incomplete 06/09/24 0823)  diphenhydrAMINE  (BENADRYL ) capsule 25 mg (25 mg Oral Given 06/09/24 9371)                                    Medical Decision Making Amount and/or Complexity of Data Reviewed Labs: ordered. Radiology: ordered.   This patient is a 78 y.o. male  who presents to the ED for concern of swelling.    Differential diagnoses prior to evaluation: The emergent differential diagnosis includes, but is not limited to, heart failure, kidney failure, protein malnutrition, allergic reaction, versus other.. This is not an exhaustive differential.   Past Medical History / Co-morbidities / Social History: Parkinson's disease, GERD, hyponatremia, carcinoid tumor   Additional history: Chart reviewed. Pertinent results include: Reviewed lab work, imaging from previous emergency department visits, notably was admitted for fracture of right hip in mid August.  Physical Exam: Physical exam performed. The pertinent findings include: Mild hypertension, blood pressure 136/91 on arrival, slightly worsened systolic on recheck, 160/83.  Vital signs otherwise stable.  Soft tissue swelling over the left hand, excoriation, some mild redness, but no warmth, no purulent discharge, no focal abscess.  Diffuse lower extremity edema without pitting.  Periorbital edema noted bilaterally.  No discharge from eyes.  Lab Tests/Imaging studies: I personally interpreted labs/imaging and the pertinent results include: BMP overall unremarkable, CBC with mild anemia, hemoglobin 10.8, his BNP is mildly elevated at 339.  Hepatic function panel with slightly low total protein and albumin..  And currently interpreted plain film chest x-ray which shows no evidence of acute intrathoracic abnormality.  I agree with the radiologist interpretation.  Cardiac monitoring: EKG obtained and interpreted by myself and attending physician which shows: Normal sinus rhythm   Medications: I ordered medication including Benadryl , oral Lasix .  I have reviewed the patients home medicines and have made adjustments as needed.   Disposition: After consideration of the diagnostic results and the patients response to treatment, I feel that on reassessment his periorbital edema has resolved, unclear etiology, may be secondary to allergic reaction, think  reasonable to have him follow up with PCP, otherwise patient stable for discharge at this time  emergency department workup does not suggest an emergent condition requiring admission or immediate intervention beyond what has been performed at this time. The plan is: as above. The patient is safe for discharge and has been instructed to return immediately for worsening symptoms, change in symptoms or any other concerns.   Final diagnoses:  Facial swelling    ED Discharge Orders     None          Rosan Sherlean VEAR DEVONNA 06/09/24 9083    Bari Charmaine FALCON, MD 06/13/24 [phone]

## 2024-06-09 NOTE — ED Provider Notes (Signed)
 MSE was initiated and I personally evaluated the patient and placed orders (if any) at  6:23 AM on June 09, 2024.  The patient appears stable so that the remainder of the MSE may be completed by another provider.   Patient evaluated.  Noted to have swelling of the face and hands this morning in his nursing facility.  Just discontinued use of doxycycline .  Patient has a history of dementia.  Does not provide any history.  He has what appears to be allergic shiners with bilateral eyes and some 1+ swelling of the bilateral feet and hands.  Airway is intact.  No oropharyngeal swelling or lesions noted.   Bari Charmaine FALCON, MD 06/09/24 [phone]

## 2024-06-09 NOTE — ED Notes (Signed)
 PT noted to be soiled with stool.   PT cleansed, clean brief applied.

## 2024-06-09 NOTE — ED Notes (Signed)
 Pts wife Macario contacted and informed of the pts discharge and transport back to [facility].

## 2024-06-09 NOTE — ED Notes (Signed)
 [facility] called for transport.

## 2024-06-09 NOTE — ED Triage Notes (Addendum)
 BIBA from [facility] care.  1 hour ago staff noticed facial swelling, also had hand and foot swelling. Stopped Doxycycline  on Aug 29th  Hx dementia, parkinson's  130/78 HR 97 96% O2

## 2024-06-09 NOTE — Discharge Instructions (Signed)
 It is difficult to say what caused his facial swelling, his workup in the emergency department was fairly reassuring, his edema resolved after 1 dose of Benadryl .  It is possible that it was from some kind of an allergic reaction, he does not show any signs of anaphylaxis.

## 2024-06-09 NOTE — ED Notes (Signed)
 Pt has soiled himself with stool.   PT cleansed, clean brief applied, pt tolerated well.

## 2024-06-09 NOTE — ED Notes (Signed)
 Discharge paperwork and necessary transport documents provided to [facility] transport crew. Pt has left ED at this time.

## 2024-06-12 ENCOUNTER — Telehealth: Payer: Self-pay | Admitting: Neurology

## 2024-06-12 NOTE — Progress Notes (Deleted)
 Assessment/Plan:   1.  Parkinsons Disease  -His genetic testing was negative.  -continue rytary  245 mg, 1 capsule 4 times per day, 1 at 7am/11am/4pm/bedtime. They note some wearing off and they want to increase it but I don't want to yet given MS change and hallucinations, esp since changing living environments soon.  Making other changes first, as below.  May consider this in few weeks at f/u appt but we will see how he does with new living environment first  -We discussed that it used to be thought that levodopa  would increase risk of melanoma but now it is believed that Parkinsons itself likely increases risk of melanoma. he is to get regular skin checks.  He is following regularly with dermatology.   2.  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  -*** nuplazid , 34 mg daily.  Discussed r/b/se, including QT prolongation.  Samples provided.  -***going into [facility] care but apparently they haven't told him yet.  3.  Depression  -On sertraline , 100 mg daily.  As above, may be making RBD worse and to discuss with prescribing physician  4.   RBD, RLS and insomnia  -sertraline  could make RBD a bit worse.  We discussed this again today as RBD is worse.  He, however, may need the sertraline  for mood  -used to follow with [facility] but doesn't now  -uses xanax  prn  -On melatonin, 10 mg nightly.  5.  B12 deficiency  -On supplementation  6.  RLS  -Now on bedtime Rytary   7.  Low back pain/lumbar radiculopathy  -has gotten injections in past from Dr. Eldonna but didn't find helpful. 8.  Hip fracture August, 2025  - No surgery required, but memory markedly worse after this event  Subjective:   Barry Pope was seen today in follow up.  My previous records were reviewed prior to todays visit.  Patient was last seen July 15.  At that point in time, we  started the patient on Nuplazid  for Parkinson's related hallucinations and delusions.  He was getting ready to transfer into [facility] memory care at that time.  Unfortunately, much happened right after that visit.  Less than a month later, the patient ended up in the hospital with a hip fracture at the memory care unit rather than splinted.  He was found laying on the floor and taken to the hospital and admitted.  This did not require surgery.  However, patient was much more agitated after this event.  That is when they called to make this follow-up appointment.  Between making this follow-up appointment and coming here today, he ended up back in the emergency room with swelling of the hands and face.  Patient was ultimately given Lasix  and he was discharged from the hospital.  It was unknown source, but the patient had been on doxycycline  for unknown reason for about a month.   Current prescribed movement disorder medications: Rytary  245 mg, 1 capsule at 7 AM/11 AM/4 PM/bedtime (increased last visit) B12  PREVIOUS MEDICATIONS: Amantadine  (stopped when he was in the hospital for confusion, but he had a lot of reasons to have confusion at that time, including hyponatremia, significantly elevated liver enzymes, fever and ultimately dx with ebv hepatitis); carbidopa /levodopa  25/100 IR -more facial dyskinesia and confusion  ALLERGIES:  No Known Allergies  CURRENT MEDICATIONS:  Outpatient Encounter Medications as of 06/13/2024  Medication Sig   acetaminophen  (TYLENOL ) 325 MG tablet Take 2 tablets (650 mg total) by mouth 3 (three) times daily.   ALPRAZolam  (XANAX ) 0.5 MG tablet Take 1 tablet (0.5 mg total) by mouth 2 (two) times daily as needed for panic.   ALPRAZolam  (XANAX ) 0.5 MG tablet Take 1 tablet (0.5 mg total) by mouth 2 (two) times daily.   Carbidopa -Levodopa  ER (RYTARY ) 61.25-245 MG CPCR Take 1 capsule by mouth 4 times per day at 7am/11am/4pm/bedtime. (Patient taking differently: Take 1  capsule by mouth See admin instructions. Take 1 capsule by mouth at 7 AM, 11 AM, 4 PM, and 8 PM)   cyanocobalamin  1000 MCG tablet Take 1 tablet (1,000 mcg total) by mouth daily.   feeding supplement (ENSURE PLUS HIGH PROTEIN) LIQD Take 237 mLs by mouth 2 (two) times daily between meals.   methocarbamol  (ROBAXIN ) 500 MG tablet Take 1 tablet 3 times a day on scheduled basis for 5 days and then 3 times a day as needed for muscle spasms   oxyCODONE  (OXY IR/ROXICODONE ) 5 MG immediate release tablet Take 0.5 tablets (2.5 mg total) by mouth every 4 (four) hours as needed for severe pain (pain score 7-10).   polyethylene glycol (MIRALAX  / GLYCOLAX ) 17 g packet Take 17 g by mouth daily as needed for moderate constipation.   senna-docusate (SENOKOT-S) 8.6-50 MG tablet Take 2 tablets by mouth 2 (two) times daily.   sertraline  (ZOLOFT ) 100 MG tablet Take 1 tablet (100 mg total) by mouth daily.   No facility-administered encounter medications on file as of 06/13/2024.    Objective:   PHYSICAL EXAMINATION:    VITALS:   There were no vitals filed for this visit.   GEN:  The patient appears stated age and is in NAD. HEENT:  Normocephalic, atraumatic.  The mucous membranes are moist. The superficial temporal arteries are without ropiness or tenderness. CV:  rrr Lungs:  ctab   Neurological examination:  Orientation: The patient is alert and oriented x3 but he is slow to react and looks to wife for finer aspects Cranial nerves: There is good facial symmetry with mild facial hypomimia. The speech is fluent and clear.  He is hypophonic.  Soft palate rises symmetrically and there is no tongue deviation. Hearing is decreased to conversational tone. Sensation: Sensation is intact to light touch throughout Motor: Strength is 5/5 in the UE/LE  Movement examination: Tone: There is mild increased tone in the RUE Abnormal movements:  There is mild facial dyskinesia; nothing in body Coordination:  There is no  decremation today Gait and Station: The patient pushes off to arise.  Ambulates well in the hall.  Tries to sit in chair a bit before he is fully in front of it when he returns to the room.   I have reviewed and interpreted the following labs independently   Lab Results  Component Value Date   TSH 1.610 05/22/2024   Lab Results  Component Value Date   VITAMINB12 243 05/24/2024   Total time spent on today's visit was *** minutes, including both face-to-face time and nonface-to-face time.  Time included that spent on review of records (prior notes available to me/labs/imaging if pertinent), discussing treatment and goals, answering patient's questions and coordinating care.    Cc:  Loreli Kins, MD

## 2024-06-12 NOTE — Telephone Encounter (Signed)
 Pt.s wife cld and cncld next appt, Pt. In facility and she would like to discuss re-evaluating his care with us  and the facility

## 2024-06-13 ENCOUNTER — Ambulatory Visit: Admitting: Neurology

## 2024-06-17 NOTE — Telephone Encounter (Signed)
 Called patients wife Macario and left message

## 2024-06-20 DIAGNOSIS — F419 Anxiety disorder, unspecified: Secondary | ICD-10-CM | POA: Diagnosis not present

## 2024-06-20 DIAGNOSIS — R278 Other lack of coordination: Secondary | ICD-10-CM | POA: Diagnosis not present

## 2024-06-20 DIAGNOSIS — G20A1 Parkinson's disease without dyskinesia, without mention of fluctuations: Secondary | ICD-10-CM | POA: Diagnosis not present

## 2024-06-20 DIAGNOSIS — R41841 Cognitive communication deficit: Secondary | ICD-10-CM | POA: Diagnosis not present

## 2024-06-20 DIAGNOSIS — R488 Other symbolic dysfunctions: Secondary | ICD-10-CM | POA: Diagnosis not present

## 2024-06-20 DIAGNOSIS — R293 Abnormal posture: Secondary | ICD-10-CM | POA: Diagnosis not present

## 2024-06-20 DIAGNOSIS — R2681 Unsteadiness on feet: Secondary | ICD-10-CM | POA: Diagnosis not present

## 2024-06-20 NOTE — Progress Notes (Signed)
 I reviewed my note and pt was complaining of cough. Given the pandemic, a viral swab was collected.

## 2024-06-24 DIAGNOSIS — K080 Exfoliation of teeth due to systemic causes: Secondary | ICD-10-CM | POA: Diagnosis not present

## 2024-06-26 DIAGNOSIS — R488 Other symbolic dysfunctions: Secondary | ICD-10-CM | POA: Diagnosis not present

## 2024-06-26 DIAGNOSIS — R278 Other lack of coordination: Secondary | ICD-10-CM | POA: Diagnosis not present

## 2024-06-26 DIAGNOSIS — R293 Abnormal posture: Secondary | ICD-10-CM | POA: Diagnosis not present

## 2024-06-27 DIAGNOSIS — R293 Abnormal posture: Secondary | ICD-10-CM | POA: Diagnosis not present

## 2024-06-27 DIAGNOSIS — R2689 Other abnormalities of gait and mobility: Secondary | ICD-10-CM | POA: Diagnosis not present

## 2024-06-27 DIAGNOSIS — R278 Other lack of coordination: Secondary | ICD-10-CM | POA: Diagnosis not present

## 2024-06-27 DIAGNOSIS — R2681 Unsteadiness on feet: Secondary | ICD-10-CM | POA: Diagnosis not present

## 2024-06-27 DIAGNOSIS — G20B1 Parkinson's disease with dyskinesia, without mention of fluctuations: Secondary | ICD-10-CM | POA: Diagnosis not present

## 2024-06-27 DIAGNOSIS — R488 Other symbolic dysfunctions: Secondary | ICD-10-CM | POA: Diagnosis not present

## 2024-06-28 DIAGNOSIS — R2681 Unsteadiness on feet: Secondary | ICD-10-CM | POA: Diagnosis not present

## 2024-06-28 DIAGNOSIS — R293 Abnormal posture: Secondary | ICD-10-CM | POA: Diagnosis not present

## 2024-06-28 DIAGNOSIS — R488 Other symbolic dysfunctions: Secondary | ICD-10-CM | POA: Diagnosis not present

## 2024-06-28 DIAGNOSIS — R278 Other lack of coordination: Secondary | ICD-10-CM | POA: Diagnosis not present

## 2024-07-01 DIAGNOSIS — K219 Gastro-esophageal reflux disease without esophagitis: Secondary | ICD-10-CM | POA: Diagnosis not present

## 2024-07-01 DIAGNOSIS — G20A1 Parkinson's disease without dyskinesia, without mention of fluctuations: Secondary | ICD-10-CM | POA: Diagnosis not present

## 2024-07-01 DIAGNOSIS — F01C4 Vascular dementia, severe, with anxiety: Secondary | ICD-10-CM | POA: Diagnosis not present

## 2024-07-01 DIAGNOSIS — R41841 Cognitive communication deficit: Secondary | ICD-10-CM | POA: Diagnosis not present

## 2024-07-01 DIAGNOSIS — N390 Urinary tract infection, site not specified: Secondary | ICD-10-CM | POA: Diagnosis not present

## 2024-07-03 DIAGNOSIS — R2689 Other abnormalities of gait and mobility: Secondary | ICD-10-CM | POA: Diagnosis not present

## 2024-07-03 DIAGNOSIS — R2681 Unsteadiness on feet: Secondary | ICD-10-CM | POA: Diagnosis not present

## 2024-07-03 DIAGNOSIS — R278 Other lack of coordination: Secondary | ICD-10-CM | POA: Diagnosis not present

## 2024-07-03 DIAGNOSIS — G20B1 Parkinson's disease with dyskinesia, without mention of fluctuations: Secondary | ICD-10-CM | POA: Diagnosis not present

## 2024-07-03 DIAGNOSIS — R293 Abnormal posture: Secondary | ICD-10-CM | POA: Diagnosis not present

## 2024-07-03 DIAGNOSIS — R488 Other symbolic dysfunctions: Secondary | ICD-10-CM | POA: Diagnosis not present

## 2024-07-04 DIAGNOSIS — G20A1 Parkinson's disease without dyskinesia, without mention of fluctuations: Secondary | ICD-10-CM | POA: Diagnosis not present

## 2024-07-04 DIAGNOSIS — F411 Generalized anxiety disorder: Secondary | ICD-10-CM | POA: Diagnosis not present

## 2024-07-04 DIAGNOSIS — F01C4 Vascular dementia, severe, with anxiety: Secondary | ICD-10-CM | POA: Diagnosis not present

## 2024-07-04 DIAGNOSIS — R2689 Other abnormalities of gait and mobility: Secondary | ICD-10-CM | POA: Diagnosis not present

## 2024-07-08 DIAGNOSIS — K219 Gastro-esophageal reflux disease without esophagitis: Secondary | ICD-10-CM | POA: Diagnosis not present

## 2024-07-08 DIAGNOSIS — R41841 Cognitive communication deficit: Secondary | ICD-10-CM | POA: Diagnosis not present

## 2024-07-08 DIAGNOSIS — F01C4 Vascular dementia, severe, with anxiety: Secondary | ICD-10-CM | POA: Diagnosis not present

## 2024-07-08 DIAGNOSIS — G20A1 Parkinson's disease without dyskinesia, without mention of fluctuations: Secondary | ICD-10-CM | POA: Diagnosis not present

## 2024-07-17 DIAGNOSIS — R634 Abnormal weight loss: Secondary | ICD-10-CM | POA: Diagnosis not present

## 2024-07-17 DIAGNOSIS — G3183 Dementia with Lewy bodies: Secondary | ICD-10-CM | POA: Diagnosis not present

## 2024-07-17 DIAGNOSIS — R1312 Dysphagia, oropharyngeal phase: Secondary | ICD-10-CM | POA: Diagnosis not present

## 2024-07-17 DIAGNOSIS — G20B1 Parkinson's disease with dyskinesia, without mention of fluctuations: Secondary | ICD-10-CM | POA: Diagnosis not present

## 2024-07-19 ENCOUNTER — Other Ambulatory Visit (HOSPITAL_BASED_OUTPATIENT_CLINIC_OR_DEPARTMENT_OTHER): Payer: Self-pay

## 2024-07-25 DIAGNOSIS — R41841 Cognitive communication deficit: Secondary | ICD-10-CM | POA: Diagnosis not present

## 2024-07-25 DIAGNOSIS — G4700 Insomnia, unspecified: Secondary | ICD-10-CM | POA: Diagnosis not present

## 2024-07-25 DIAGNOSIS — F01C4 Vascular dementia, severe, with anxiety: Secondary | ICD-10-CM | POA: Diagnosis not present

## 2024-07-25 DIAGNOSIS — G20B1 Parkinson's disease with dyskinesia, without mention of fluctuations: Secondary | ICD-10-CM | POA: Diagnosis not present

## 2024-08-05 DIAGNOSIS — K219 Gastro-esophageal reflux disease without esophagitis: Secondary | ICD-10-CM | POA: Diagnosis not present

## 2024-08-05 DIAGNOSIS — G20B1 Parkinson's disease with dyskinesia, without mention of fluctuations: Secondary | ICD-10-CM | POA: Diagnosis not present

## 2024-08-05 DIAGNOSIS — G3183 Dementia with Lewy bodies: Secondary | ICD-10-CM | POA: Diagnosis not present

## 2024-08-22 DIAGNOSIS — G4700 Insomnia, unspecified: Secondary | ICD-10-CM | POA: Diagnosis not present

## 2024-08-22 DIAGNOSIS — F411 Generalized anxiety disorder: Secondary | ICD-10-CM | POA: Diagnosis not present

## 2024-08-22 DIAGNOSIS — G20A1 Parkinson's disease without dyskinesia, without mention of fluctuations: Secondary | ICD-10-CM | POA: Diagnosis not present

## 2024-08-22 DIAGNOSIS — R41841 Cognitive communication deficit: Secondary | ICD-10-CM | POA: Diagnosis not present

## 2024-08-27 ENCOUNTER — Encounter: Payer: Self-pay | Admitting: Oncology

## 2024-08-30 ENCOUNTER — Encounter: Payer: Self-pay | Admitting: Oncology

## 2024-09-02 DIAGNOSIS — G20A1 Parkinson's disease without dyskinesia, without mention of fluctuations: Secondary | ICD-10-CM | POA: Diagnosis not present

## 2024-09-02 DIAGNOSIS — F331 Major depressive disorder, recurrent, moderate: Secondary | ICD-10-CM | POA: Diagnosis not present

## 2024-09-02 DIAGNOSIS — F02811 Dementia in other diseases classified elsewhere, unspecified severity, with agitation: Secondary | ICD-10-CM | POA: Diagnosis not present

## 2024-09-02 DIAGNOSIS — F01C4 Vascular dementia, severe, with anxiety: Secondary | ICD-10-CM | POA: Diagnosis not present
